# Patient Record
Sex: Male | Born: 1955 | Race: Black or African American | Hispanic: No | Marital: Single | Smoking: Never smoker
Health system: Southern US, Community
[De-identification: ages and names within clinical notes are randomized; demographics above are authoritative.]

## PROBLEM LIST (undated history)

## (undated) DIAGNOSIS — E119 Type 2 diabetes mellitus without complications: Secondary | ICD-10-CM

## (undated) DIAGNOSIS — I639 Cerebral infarction, unspecified: Secondary | ICD-10-CM

## (undated) DIAGNOSIS — I10 Essential (primary) hypertension: Secondary | ICD-10-CM

## (undated) DIAGNOSIS — E039 Hypothyroidism, unspecified: Secondary | ICD-10-CM

## (undated) DIAGNOSIS — D649 Anemia, unspecified: Secondary | ICD-10-CM

## (undated) DIAGNOSIS — N189 Chronic kidney disease, unspecified: Secondary | ICD-10-CM

## (undated) DIAGNOSIS — E785 Hyperlipidemia, unspecified: Secondary | ICD-10-CM

## (undated) DIAGNOSIS — G8220 Paraplegia, unspecified: Secondary | ICD-10-CM

## (undated) DIAGNOSIS — I629 Nontraumatic intracranial hemorrhage, unspecified: Secondary | ICD-10-CM

## (undated) DIAGNOSIS — Z7401 Bed confinement status: Secondary | ICD-10-CM

## (undated) HISTORY — PX: TOE AMPUTATION: SHX809

## (undated) HISTORY — PX: COLONOSCOPY: SHX174

---

## 2019-10-18 DIAGNOSIS — E119 Type 2 diabetes mellitus without complications: Secondary | ICD-10-CM | POA: Insufficient documentation

## 2019-12-09 DIAGNOSIS — I639 Cerebral infarction, unspecified: Secondary | ICD-10-CM

## 2019-12-09 HISTORY — DX: Cerebral infarction, unspecified: I63.9

## 2020-05-12 ENCOUNTER — Emergency Department (HOSPITAL_COMMUNITY): Payer: Medicare Other

## 2020-05-12 ENCOUNTER — Other Ambulatory Visit: Payer: Self-pay

## 2020-05-12 ENCOUNTER — Inpatient Hospital Stay (HOSPITAL_COMMUNITY)
Admission: EM | Admit: 2020-05-12 | Discharge: 2020-05-17 | DRG: 065 | Disposition: A | Discharge status: Home / Self Care | Payer: Medicare Other | Attending: Neurology | Admitting: Neurology

## 2020-05-12 ENCOUNTER — Inpatient Hospital Stay (HOSPITAL_COMMUNITY): Payer: Medicare Other

## 2020-05-12 DIAGNOSIS — G8220 Paraplegia, unspecified: Secondary | ICD-10-CM | POA: Diagnosis present

## 2020-05-12 DIAGNOSIS — I161 Hypertensive emergency: Secondary | ICD-10-CM | POA: Diagnosis present

## 2020-05-12 DIAGNOSIS — E785 Hyperlipidemia, unspecified: Secondary | ICD-10-CM | POA: Diagnosis present

## 2020-05-12 DIAGNOSIS — N1832 Chronic kidney disease, stage 3b: Secondary | ICD-10-CM | POA: Diagnosis present

## 2020-05-12 DIAGNOSIS — I10 Essential (primary) hypertension: Secondary | ICD-10-CM | POA: Diagnosis present

## 2020-05-12 DIAGNOSIS — I619 Nontraumatic intracerebral hemorrhage, unspecified: Secondary | ICD-10-CM | POA: Diagnosis present

## 2020-05-12 DIAGNOSIS — M4800 Spinal stenosis, site unspecified: Secondary | ICD-10-CM | POA: Diagnosis present

## 2020-05-12 DIAGNOSIS — R29810 Facial weakness: Secondary | ICD-10-CM | POA: Diagnosis present

## 2020-05-12 DIAGNOSIS — E876 Hypokalemia: Secondary | ICD-10-CM | POA: Diagnosis present

## 2020-05-12 DIAGNOSIS — I6389 Other cerebral infarction: Secondary | ICD-10-CM

## 2020-05-12 DIAGNOSIS — I618 Other nontraumatic intracerebral hemorrhage: Principal | ICD-10-CM | POA: Diagnosis present

## 2020-05-12 DIAGNOSIS — Z20822 Contact with and (suspected) exposure to covid-19: Secondary | ICD-10-CM | POA: Diagnosis present

## 2020-05-12 DIAGNOSIS — Z993 Dependence on wheelchair: Secondary | ICD-10-CM

## 2020-05-12 DIAGNOSIS — N183 Chronic kidney disease, stage 3 unspecified: Secondary | ICD-10-CM | POA: Diagnosis present

## 2020-05-12 DIAGNOSIS — R402362 Coma scale, best motor response, obeys commands, at arrival to emergency department: Secondary | ICD-10-CM | POA: Diagnosis present

## 2020-05-12 DIAGNOSIS — Z79899 Other long term (current) drug therapy: Secondary | ICD-10-CM

## 2020-05-12 DIAGNOSIS — N1831 Chronic kidney disease, stage 3a: Secondary | ICD-10-CM | POA: Diagnosis present

## 2020-05-12 DIAGNOSIS — N179 Acute kidney failure, unspecified: Secondary | ICD-10-CM | POA: Diagnosis present

## 2020-05-12 DIAGNOSIS — R402142 Coma scale, eyes open, spontaneous, at arrival to emergency department: Secondary | ICD-10-CM | POA: Diagnosis present

## 2020-05-12 DIAGNOSIS — R402232 Coma scale, best verbal response, inappropriate words, at arrival to emergency department: Secondary | ICD-10-CM | POA: Diagnosis present

## 2020-05-12 DIAGNOSIS — I129 Hypertensive chronic kidney disease with stage 1 through stage 4 chronic kidney disease, or unspecified chronic kidney disease: Secondary | ICD-10-CM | POA: Diagnosis present

## 2020-05-12 DIAGNOSIS — R4781 Slurred speech: Secondary | ICD-10-CM | POA: Diagnosis present

## 2020-05-12 DIAGNOSIS — R4702 Dysphasia: Secondary | ICD-10-CM | POA: Diagnosis present

## 2020-05-12 DIAGNOSIS — I610 Nontraumatic intracerebral hemorrhage in hemisphere, subcortical: Secondary | ICD-10-CM | POA: Diagnosis not present

## 2020-05-12 DIAGNOSIS — R29717 NIHSS score 17: Secondary | ICD-10-CM | POA: Diagnosis present

## 2020-05-12 DIAGNOSIS — R471 Dysarthria and anarthria: Secondary | ICD-10-CM | POA: Diagnosis present

## 2020-05-12 LAB — PROTIME-INR
INR: 1.1 (ref 0.8–1.2)
INR: 1.1 (ref 0.8–1.2)
Prothrombin Time: 13.6 seconds (ref 11.4–15.2)
Prothrombin Time: 13.6 seconds (ref 11.4–15.2)

## 2020-05-12 LAB — COMPREHENSIVE METABOLIC PANEL
ALT: 17 U/L (ref 0–44)
AST: 24 U/L (ref 15–41)
Albumin: 2.9 g/dL — ABNORMAL LOW (ref 3.5–5.0)
Alkaline Phosphatase: 83 U/L (ref 38–126)
Anion gap: 9 (ref 5–15)
BUN: 16 mg/dL (ref 8–23)
CO2: 27 mmol/L (ref 22–32)
Calcium: 8.8 mg/dL — ABNORMAL LOW (ref 8.9–10.3)
Chloride: 103 mmol/L (ref 98–111)
Creatinine, Ser: 1.76 mg/dL — ABNORMAL HIGH (ref 0.61–1.24)
GFR calc Af Amer: 46 mL/min — ABNORMAL LOW (ref >60)
GFR calc non Af Amer: 39 mL/min — ABNORMAL LOW (ref >60)
Glucose, Bld: 136 mg/dL — ABNORMAL HIGH (ref 70–99)
Potassium: 3.7 mmol/L (ref 3.5–5.1)
Sodium: 139 mmol/L (ref 135–145)
Total Bilirubin: 0.6 mg/dL (ref 0.3–1.2)
Total Protein: 7 g/dL (ref 6.5–8.1)

## 2020-05-12 LAB — DIFFERENTIAL
Abs Immature Granulocytes: 0.04 10*3/uL (ref 0.00–0.07)
Basophils Absolute: 0 10*3/uL (ref 0.0–0.1)
Basophils Relative: 0 %
Eosinophils Absolute: 0.2 10*3/uL (ref 0.0–0.5)
Eosinophils Relative: 3 %
Immature Granulocytes: 1 %
Lymphocytes Relative: 25 %
Lymphs Abs: 2 10*3/uL (ref 0.7–4.0)
Monocytes Absolute: 0.6 10*3/uL (ref 0.1–1.0)
Monocytes Relative: 8 %
Neutro Abs: 5 10*3/uL (ref 1.7–7.7)
Neutrophils Relative %: 63 %

## 2020-05-12 LAB — LIPID PANEL
Cholesterol: 165 mg/dL (ref 0–200)
HDL: 40 mg/dL — ABNORMAL LOW (ref >40)
LDL Cholesterol: 110 mg/dL — ABNORMAL HIGH (ref 0–99)
Total CHOL/HDL Ratio: 4.1 RATIO
Triglycerides: 74 mg/dL (ref <150)
VLDL: 15 mg/dL (ref 0–40)

## 2020-05-12 LAB — I-STAT CHEM 8, ED
BUN: 19 mg/dL (ref 8–23)
Calcium, Ion: 1.12 mmol/L — ABNORMAL LOW (ref 1.15–1.40)
Chloride: 103 mmol/L (ref 98–111)
Creatinine, Ser: 1.9 mg/dL — ABNORMAL HIGH (ref 0.61–1.24)
Glucose, Bld: 131 mg/dL — ABNORMAL HIGH (ref 70–99)
HCT: 37 % — ABNORMAL LOW (ref 39.0–52.0)
Hemoglobin: 12.6 g/dL — ABNORMAL LOW (ref 13.0–17.0)
Potassium: 3.7 mmol/L (ref 3.5–5.1)
Sodium: 141 mmol/L (ref 135–145)
TCO2: 25 mmol/L (ref 22–32)

## 2020-05-12 LAB — CBC
HCT: 36.3 % — ABNORMAL LOW (ref 39.0–52.0)
Hemoglobin: 11.4 g/dL — ABNORMAL LOW (ref 13.0–17.0)
MCH: 31.7 pg (ref 26.0–34.0)
MCHC: 31.4 g/dL (ref 30.0–36.0)
MCV: 100.8 fL — ABNORMAL HIGH (ref 80.0–100.0)
Platelets: 266 10*3/uL (ref 150–400)
RBC: 3.6 MIL/uL — ABNORMAL LOW (ref 4.22–5.81)
RDW: 14 % (ref 11.5–15.5)
WBC: 7.9 10*3/uL (ref 4.0–10.5)
nRBC: 0 % (ref 0.0–0.2)

## 2020-05-12 LAB — APTT: aPTT: 30 seconds (ref 24–36)

## 2020-05-12 LAB — ETHANOL: Alcohol, Ethyl (B): <10 mg/dL (ref <10)

## 2020-05-12 LAB — ECHOCARDIOGRAM COMPLETE: Weight: 2740.76 oz

## 2020-05-12 LAB — HIV ANTIBODY (ROUTINE TESTING W REFLEX): HIV Screen 4th Generation wRfx: NONREACTIVE

## 2020-05-12 LAB — HEMOGLOBIN A1C
Hgb A1c MFr Bld: 5.2 % (ref 4.8–5.6)
Mean Plasma Glucose: 102.54 mg/dL

## 2020-05-12 LAB — SARS CORONAVIRUS 2 BY RT PCR (HOSPITAL ORDER, PERFORMED IN ~~LOC~~ HOSPITAL LAB): SARS Coronavirus 2: NEGATIVE

## 2020-05-12 LAB — MRSA PCR SCREENING: MRSA by PCR: NEGATIVE

## 2020-05-12 LAB — CBG MONITORING, ED: Glucose-Capillary: 124 mg/dL — ABNORMAL HIGH (ref 70–99)

## 2020-05-12 MED ORDER — SODIUM CHLORIDE 0.9 % IV SOLN: INTRAVENOUS | Status: AC

## 2020-05-12 MED ORDER — CLEVIDIPINE BUTYRATE 0.5 MG/ML IV EMUL: 0.0000 mg/h | INTRAVENOUS | Status: DC

## 2020-05-12 MED ORDER — METOPROLOL TARTRATE 25 MG PO TABS
25.0000 mg | ORAL_TABLET | Freq: Two times a day (BID) | ORAL | Status: DC
  Administered 2020-05-12 – 2020-05-17 (×11): 25 mg via ORAL
  Filled 2020-05-12 (×11): qty 1

## 2020-05-12 MED ORDER — STROKE: EARLY STAGES OF RECOVERY BOOK
Freq: Once | Status: AC
  Filled 2020-05-12: qty 1

## 2020-05-12 MED ORDER — PANTOPRAZOLE SODIUM 40 MG IV SOLR
40.0000 mg | Freq: Every day | INTRAVENOUS | Status: DC
  Administered 2020-05-12: 40 mg via INTRAVENOUS
  Filled 2020-05-12: qty 40

## 2020-05-12 MED ORDER — CHLORHEXIDINE GLUCONATE CLOTH 2 % EX PADS
6.0000 | MEDICATED_PAD | Freq: Every day | CUTANEOUS | Status: DC
  Administered 2020-05-12 – 2020-05-17 (×6): 6 via TOPICAL

## 2020-05-12 MED ORDER — CLEVIDIPINE BUTYRATE 0.5 MG/ML IV EMUL
0.0000 mg/h | INTRAVENOUS | Status: DC
  Administered 2020-05-12: 8 mg/h via INTRAVENOUS
  Administered 2020-05-12: 1 mg/h via INTRAVENOUS
  Administered 2020-05-12 – 2020-05-13 (×2): 7 mg/h via INTRAVENOUS
  Administered 2020-05-13: 8 mg/h via INTRAVENOUS
  Administered 2020-05-13: 5 mg/h via INTRAVENOUS
  Administered 2020-05-14: 6 mg/h via INTRAVENOUS
  Administered 2020-05-14: 7 mg/h via INTRAVENOUS
  Filled 2020-05-12 (×9): qty 50

## 2020-05-12 MED ORDER — ACETAMINOPHEN 650 MG RE SUPP: 650.0000 mg | RECTAL | Status: DC | PRN

## 2020-05-12 MED ORDER — SENNOSIDES-DOCUSATE SODIUM 8.6-50 MG PO TABS
1.0000 | ORAL_TABLET | Freq: Two times a day (BID) | ORAL | Status: DC
  Administered 2020-05-12 (×2): 1 via ORAL
  Filled 2020-05-12 (×3): qty 1

## 2020-05-12 MED ORDER — ACETAMINOPHEN 325 MG PO TABS
650.0000 mg | ORAL_TABLET | ORAL | Status: DC | PRN
  Administered 2020-05-13 – 2020-05-15 (×5): 650 mg via ORAL
  Filled 2020-05-12 (×5): qty 2

## 2020-05-12 MED ORDER — ACETAMINOPHEN 160 MG/5ML PO SOLN: 650.0000 mg | ORAL | Status: DC | PRN

## 2020-05-12 MED ORDER — TRAZODONE HCL 50 MG PO TABS
50.0000 mg | ORAL_TABLET | Freq: Every evening | ORAL | Status: DC | PRN
  Administered 2020-05-15: 50 mg via ORAL
  Filled 2020-05-12: qty 1

## 2020-05-12 NOTE — Code Documentation (Addendum)
Code Stroke Documentation  Pt arrived to [facility] via EMS at 0820. EMS brought pt in with new onset slurred speech, right arm weakness, and right leg weakness. Per EMS, the patient has a past history of spinal stenosis, causing him to have a decreased movement on the right side, however, his symptoms were worse this morning. Pt states he lives at home with his brother. Pt states he is wheelchair bound. Per pt, his LKN was 2300 on 05/11/2020 before he went to bed. Code stroke was activated at 0840.  Upon assessment, he is found to be alert. Disoriented to age and month. Minor right facial droop. No effort against gravity to the right arm and right leg. Can't resist gravity to the left leg. Ataxia present in his upper and lower extremities. Sensation loss in his right arm and face. Moderate aphasia. Moderate dysarthria.   CT head completed. Cleviprex gtt started for BP of 227/99.   Plan: q1 hour mNIHSS and VS w/ pupils, titrate Cleviprex to maintain SBP 100-140. Handoff given to S. Andrey Campanile, RN.

## 2020-05-12 NOTE — Plan of Care (Signed)
Pt on room air, calm, cooperative. Attempted to locate son in lobby but not present.

## 2020-05-12 NOTE — Progress Notes (Signed)
Found phone number for [name] Poke, pt son, and called him to give update. Aware patient is now on 4N-ICU and will come visit soon.  Sons: Chrsitopher Wik [phone] Simmie Davies Thurnell Lose. [phone]

## 2020-05-12 NOTE — Progress Notes (Signed)
Echocardiogram 2D Echocardiogram has been performed.  Terry Barton 05/12/2020, 2:52 PM

## 2020-05-12 NOTE — ED Provider Notes (Signed)
[facility] EMERGENCY DEPARTMENT Provider Note   CSN: [id number] Arrival date & time: 05/12/20  9211  An emergency department physician performed an initial assessment on this suspected stroke patient at 57.  History Chief Complaint  Patient presents with  . Code Stroke    Terry Barton is a 64 y.o. male.  HPI Level 5 caveat due to altered mental status. Patient presented from home.  Reportedly right-sided weakness.  Somewhat difficult to get history.  Patient does have some confusion and states he normally can use his right side without any difficulty, although also states he uses a wheelchair.  Reported history of spinal stenosis with right-sided weakness.  Does question that the right side may have gone flaccid.  Upon arrival right arm is held against body.  Does have right-sided weakness and right-sided facial droop.  Last normal was apparently 1130 last night.  Does have some differentiating left and right with sensation.     No past medical history on file. unknown There are no problems to display for this patient.        No family history on file.  Social History   Tobacco Use  . Smoking status: Not on file  Substance Use Topics  . Alcohol use: Not on file  . Drug use: Not on file    Home Medications Prior to Admission medications   Medication Sig Start Date End Date Taking? Authorizing Provider  cyproheptadine (PERIACTIN) 4 MG tablet Take by mouth 3 (three) times daily.   Yes [provider]  ferrous sulfate 325 (65 FE) MG EC tablet Take 325 mg by mouth 3 (three) times daily with meals.   Yes [provider]  metoprolol tartrate (LOPRESSOR) 25 MG tablet Take by mouth 2 (two) times daily.   Yes [provider]  traZODone (DESYREL) 50 MG tablet Take 50 mg by mouth at bedtime as needed for sleep.   Yes [provider]    Allergies    Patient has no allergy information on record.  Review of Systems     Review of Systems  Unable to perform ROS: Mental status change    Physical Exam Updated Vital Signs BP 125/63   Pulse 67   Temp 97.7 F (36.5 C) (Oral)   Resp 14   Wt 77.7 kg   SpO2 100%   Physical Exam Vitals and nursing note reviewed.  HENT:     Head: Atraumatic.  Eyes:     Extraocular Movements: Extraocular movements intact.  Cardiovascular:     Rate and Rhythm: Regular rhythm.  Pulmonary:     Breath sounds: No wheezing or rhonchi.  Abdominal:     Tenderness: There is no abdominal tenderness.  Musculoskeletal:        General: No tenderness.     Cervical back: Neck supple.     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  Neurological:     Mental Status: He is alert.     Comments: Awake and answers questions with somewhat slow to answer and some confusion.  Right-sided facial droop.  Eye movements intact.  Right upper extremity held against body but will follow some commands with the.  Will raise right leg some but weaker compared to contralateral.  Better strength on left side.  Complete NIH scoring done by neurology.  Differentiating sensation on left right or both appears somewhat difficult.     ED Results / Procedures / Treatments   Labs (all labs ordered are  listed, but only abnormal results are displayed) Labs Reviewed  CBC - Abnormal; Notable for the following components:      Result Value   RBC 3.60 (*)    Hemoglobin 11.4 (*)    HCT 36.3 (*)    MCV 100.8 (*)    All other components within normal limits  COMPREHENSIVE METABOLIC PANEL - Abnormal; Notable for the following components:   Glucose, Bld 136 (*)    Creatinine, Ser 1.76 (*)    Calcium 8.8 (*)    Albumin 2.9 (*)    GFR calc non Af Amer 39 (*)    GFR calc Af Amer 46 (*)    All other components within normal limits  I-STAT CHEM 8, ED - Abnormal; Notable for the following components:   Creatinine, Ser 1.90 (*)    Glucose, Bld 131 (*)    Calcium, Ion 1.12 (*)    Hemoglobin 12.6  (*)    HCT 37.0 (*)    All other components within normal limits  CBG MONITORING, ED - Abnormal; Notable for the following components:   Glucose-Capillary 124 (*)    All other components within normal limits  SARS CORONAVIRUS 2 BY RT PCR (HOSPITAL ORDER, PERFORMED IN [facility])  ETHANOL  PROTIME-INR  APTT  DIFFERENTIAL  RAPID URINE DRUG SCREEN, HOSP PERFORMED  URINALYSIS, ROUTINE W REFLEX MICROSCOPIC    EKG EKG Interpretation  Date/Time:  Saturday May 12 2020 08:22:46 EDT Ventricular Rate:  67 PR Interval:  202 QRS Duration: 76 QT Interval:  432 QTC Calculation: 456 R Axis:   21 Text Interpretation: Normal sinus rhythm Nonspecific T wave abnormality Abnormal ECG Confirmed by Benjiman Core [phone]) on 05/12/2020 8:38:49 AM   Radiology CT HEAD CODE STROKE WO CONTRAST  Result Date: 05/12/2020 CLINICAL DATA:  Code stroke. Right-sided weakness and right facial droop. EXAM: CT HEAD WITHOUT CONTRAST TECHNIQUE: Contiguous axial images were obtained from the base of the skull through the vertex without intravenous contrast. COMPARISON:  None. FINDINGS: Brain: 1.6 by 1.7 x 2.4 cm in diameter intraparenchymal hematoma in the left thalamus with intraventricular penetration. Surrounding edema. No hydrocephalus. Chronic small-vessel ischemic changes affect the pons and cerebral hemispheric white matter elsewhere. Vascular: There is atherosclerotic calcification of the major vessels at the base of the brain. Skull: Negative Sinuses/Orbits: Inflammatory changes of the right division of the sphenoid sinus. Orbits negative. Other: None ASPECTS (Alberta Stroke Program Early CT Score) - Ganglionic level infarction (caudate, lentiform nuclei, internal capsule, insula, M1-M3 cortex): 7 - Supraganglionic infarction (M4-M6 cortex): 3 Total score (0-10 with 10 being normal): 10 IMPRESSION: 1. Acute intraparenchymal hemorrhage in the left thalamus with measurements of 1.6 x 1.7 x 2.4 (volume =  3.4) Cm mild surrounding edema. Intraventricular penetration with blood in the left lateral ventricle. No ventricular obstruction. 2. ASPECTS is 10. 3. These results were communicated to Dr. Otelia Limes at 8:57 amon 6/5/2021by text page via the [facility] messaging system. Electronically Signed   By: Paulina Fusi M.D.   On: 05/12/2020 08:58    Procedures Procedures (including critical care time)  Medications Ordered in ED Medications  clevidipine (CLEVIPREX) infusion 0.5 mg/mL (4 mg/hr Intravenous Rate/Dose Change 05/12/20 0272)    ED Course  I have reviewed the triage vital signs and the nursing notes.  Pertinent labs & imaging results that were available during my care of the patient were reviewed by me and considered in my medical decision making (see chart for details).    MDM Rules/Calculators/A&P  Patient came in with right-sided neuro deficits.  Unknown chronicity reported last normal last night at 1130 but unsure what that normally is.  Reportedly had weakness but also patient states he has no weakness.  But he does have a wheelchair.There was question of being flaccid but he is much more contracted this time.  Code stroke called due to neuro deficits.  Patient is rather severely hypertensive also.  Head CT done and showed intracranial hemorrhage.  Will be admitted to ICU by neurology.  CRITICAL CARE Performed by: Benjiman Core Total critical care time: 30 minutes Critical care time was exclusive of separately billable procedures and treating other patients. Critical care was necessary to treat or prevent imminent or life-threatening deterioration. Critical care was time spent personally by me on the following activities: development of treatment plan with patient and/or surrogate as well as nursing, discussions with consultants, evaluation of patient's response to treatment, examination of patient, obtaining history from patient or surrogate, ordering and performing  treatments and interventions, ordering and review of laboratory studies, ordering and review of radiographic studies, pulse oximetry and re-evaluation of patient's condition.  Final Clinical Impression(s) / ED Diagnoses Final diagnoses:  Hemorrhagic stroke [facility])    Rx / DC Orders ED Discharge Orders    None       Benjiman Core, MD 05/12/20 0930

## 2020-05-12 NOTE — ED Triage Notes (Signed)
Patient arrives via ems from home due to having new onset slurred speech and increased R. Arm and R. Leg weakness. Per ems, the patient has a past history of spinal stenosis, causing him to have a decreased movement on the right side, however, his symptoms were worse this morning.   Patient arrives with A&O x2. LKW at 2300 last night.

## 2020-05-12 NOTE — H&P (Signed)
Admission H&P    Chief Complaint: Acute onset of worsened right sided weakness.  HPI: Terry Barton is an 64 y.o. male presenting to [facility] with acute onset of worsened right sided weakness and new onset slurred speech. LKN was last night at 2330. Per EMS, he has a history of spinal stenosis with associated right sided weakness at baseline and uses a wheelchair. He lives at home with his brother. In the context of patient being confused, he informed the EDP that at his baseline he can use his right side without any difficulty. His BP was 227/99 in CT.   CT head revealed an acute left thalamic hemorrhage with mild surrounding edema and extension of blood into the left lateral ventricle.   No past medical history on file.  PMHx Spinal stenosis per report  No family history on file. Social History:  has no history on file for tobacco, alcohol, and drug.  Allergies: Not on File  No current facility-administered medications on file prior to encounter.   Current Outpatient Medications on File Prior to Encounter  Medication Sig Dispense Refill  . cyproheptadine (PERIACTIN) 4 MG tablet Take by mouth 3 (three) times daily.    . ferrous sulfate 325 (65 FE) MG EC tablet Take 325 mg by mouth 3 (three) times daily with meals.    . metoprolol tartrate (LOPRESSOR) 25 MG tablet Take by mouth 2 (two) times daily.    . traZODone (DESYREL) 50 MG tablet Take 50 mg by mouth at bedtime as needed for sleep.      ROS: As per HPI. Comprehensive ROS otherwise negative.   Physical Examination: Blood pressure 125/63, pulse 67, temperature 97.7 F (36.5 C), temperature source Oral, resp. rate 14, weight 77.7 kg, SpO2 100 %.  HEENT-  [place]/AT  Lungs - Respirations unlabored  Ext: No cyanosis. Warm and well perfused.  Neurologic Examination: Ment: Awake and alert. Confused. Oriented 3/5. Increased latencies of verbal and motor responses. Speech mildly dysarthric. In the context of sparse speech, there is word  finding difficulty and hesitation, consistent with loss of fluency. Has difficulty with naming. Repetition impaired. Has difficulty following some commands.  CN: PERRL. No visual field cut. Eyes are conjugate with slight left gaze preference. Able to track to left and right, but has more difficulty with rightward gaze. Temp sensation equal. Mild right facial droop. Hearing intact to voice. No hoarseness, but speech is somewhat hypophonic. Head is preferentially rotated slightly to the left. Tongue deviates slightly to the right.  Motor:  RUE: Increased flexor tone at elbow, wrist and digits. Some loss of muscle mass of hand intrinsics. Arm held adducted at shoulder. 3/5 strength at deltoid, otherwise 2/5 triceps/biceps and grip.  RLE: Falls to bed immediately after being passively elevated. Withdraws from noxious with 2/5 strength.  LUE and LLE 4/5.  Sensory: Absent FT and pain sensation to RUE. Subjectively intact FT and temp sensation to LUE and BLE.  Reflexes: Low amplitude reflexes x 4, brisker on the right.  Cerebellar: Ataxic BUE Gait: Unable to assess.    Results for orders placed or performed during the hospital encounter of 05/12/20 (from the past 48 hour(s))  Ethanol     Status: None   Collection Time: 05/12/20  8:35 AM  Result Value Ref Range   Alcohol, Ethyl (B) <10 <10 mg/dL    Comment: (NOTE) Lowest detectable limit for serum alcohol is 10 mg/dL. For medical purposes only. Performed at [facility], [place],  [place] [address]   CBG monitoring, ED     Status: Abnormal   Collection Time: 05/12/20  8:35 AM  Result Value Ref Range   Glucose-Capillary 124 (H) 70 - 99 mg/dL    Comment: Glucose reference range applies only to samples taken after fasting for at least 8 hours.  Protime-INR     Status: None   Collection Time: 05/12/20  8:39 AM  Result Value Ref Range   Prothrombin Time 13.6 11.4 - 15.2 seconds   INR 1.1 0.8 - 1.2    Comment: (NOTE) INR goal  varies based on device and disease states. Performed at [facility], [address], [place], [place] [address]   APTT     Status: None   Collection Time: 05/12/20  8:39 AM  Result Value Ref Range   aPTT 30 24 - 36 seconds    Comment: Performed at [facility], [address], [place], [place] [address]  CBC     Status: Abnormal   Collection Time: 05/12/20  8:39 AM  Result Value Ref Range   WBC 7.9 4.0 - 10.5 K/uL   RBC 3.60 (L) 4.22 - 5.81 MIL/uL   Hemoglobin 11.4 (L) 13.0 - 17.0 g/dL   HCT 64.4 (L) 03.4 - 74.2 %   MCV 100.8 (H) 80.0 - 100.0 fL   MCH 31.7 26.0 - 34.0 pg   MCHC 31.4 30.0 - 36.0 g/dL   RDW 59.5 63.8 - 75.6 %   Platelets 266 150 - 400 K/uL   nRBC 0.0 0.0 - 0.2 %    Comment: Performed at [facility], [address] [place], [place] [address]  Differential     Status: None   Collection Time: 05/12/20  8:39 AM  Result Value Ref Range   Neutrophils Relative % 63 %   Neutro Abs 5.0 1.7 - 7.7 K/uL   Lymphocytes Relative 25 %   Lymphs Abs 2.0 0.7 - 4.0 K/uL   Monocytes Relative 8 %   Monocytes Absolute 0.6 0.1 - 1.0 K/uL   Eosinophils Relative 3 %   Eosinophils Absolute 0.2 0.0 - 0.5 K/uL   Basophils Relative 0 %   Basophils Absolute 0.0 0.0 - 0.1 K/uL   Immature Granulocytes 1 %   Abs Immature Granulocytes 0.04 0.00 - 0.07 K/uL    Comment: Performed at [facility], [address], [place], [place] [address]  Comprehensive metabolic panel     Status: Abnormal   Collection Time: 05/12/20  8:39 AM  Result Value Ref Range   Sodium 139 135 - 145 mmol/L   Potassium 3.7 3.5 - 5.1 mmol/L   Chloride 103 98 - 111 mmol/L   CO2 27 22 - 32 mmol/L   Glucose, Bld 136 (H) 70 - 99 mg/dL    Comment: Glucose reference range applies only to samples taken after fasting for at least 8 hours.   BUN 16 8 - 23 mg/dL   Creatinine, Ser 1.66 (H) 0.61 - 1.24 mg/dL   Calcium 8.8 (L) 8.9 - 10.3 mg/dL   Total Protein 7.0 6.5 - 8.1 g/dL   Albumin 2.9 (L) 3.5 -  5.0 g/dL   AST 24 15 - 41 U/L   ALT 17 0 - 44 U/L   Alkaline Phosphatase 83 38 - 126 U/L   Total Bilirubin 0.6 0.3 - 1.2 mg/dL   GFR calc non Af Amer 39 (L) >60 mL/min   GFR calc Af Amer 46 (  L) >60 mL/min   Anion gap 9 5 - 15    Comment: Performed at [facility], [address], [place], [place] [address]  I-stat chem 8, ED     Status: Abnormal   Collection Time: 05/12/20  8:44 AM  Result Value Ref Range   Sodium 141 135 - 145 mmol/L   Potassium 3.7 3.5 - 5.1 mmol/L   Chloride 103 98 - 111 mmol/L   BUN 19 8 - 23 mg/dL   Creatinine, Ser 8.34 (H) 0.61 - 1.24 mg/dL   Glucose, Bld 196 (H) 70 - 99 mg/dL    Comment: Glucose reference range applies only to samples taken after fasting for at least 8 hours.   Calcium, Ion 1.12 (L) 1.15 - 1.40 mmol/L   TCO2 25 22 - 32 mmol/L   Hemoglobin 12.6 (L) 13.0 - 17.0 g/dL   HCT 22.2 (L) 97.9 - 89.2 %   CT HEAD CODE STROKE WO CONTRAST  Result Date: 05/12/2020 CLINICAL DATA:  Code stroke. Right-sided weakness and right facial droop. EXAM: CT HEAD WITHOUT CONTRAST TECHNIQUE: Contiguous axial images were obtained from the base of the skull through the vertex without intravenous contrast. COMPARISON:  None. FINDINGS: Brain: 1.6 by 1.7 x 2.4 cm in diameter intraparenchymal hematoma in the left thalamus with intraventricular penetration. Surrounding edema. No hydrocephalus. Chronic small-vessel ischemic changes affect the pons and cerebral hemispheric white matter elsewhere. Vascular: There is atherosclerotic calcification of the major vessels at the base of the brain. Skull: Negative Sinuses/Orbits: Inflammatory changes of the right division of the sphenoid sinus. Orbits negative. Other: None ASPECTS (Alberta Stroke Program Early CT Score) - Ganglionic level infarction (caudate, lentiform nuclei, internal capsule, insula, M1-M3 cortex): 7 - Supraganglionic infarction (M4-M6 cortex): 3 Total score (0-10 with 10 being normal): 10 IMPRESSION: 1. Acute  intraparenchymal hemorrhage in the left thalamus with measurements of 1.6 x 1.7 x 2.4 (volume = 3.4) Cm mild surrounding edema. Intraventricular penetration with blood in the left lateral ventricle. No ventricular obstruction. 2. ASPECTS is 10. 3. These results were communicated to Dr. Otelia Limes at 8:57 amon 6/5/2021by text page via the [facility] messaging system. Electronically Signed   By: Paulina Fusi M.D.   On: 05/12/2020 08:58    Assessment: 64 year old male presenting with acute onset of worsened right sided weakness. 1. Exam reveals plegic RUE and paretic RLE in addition to slight left sided gaze preference and speech deficits.  2. CT head reveals an acute left thalamic hemorrhage with mild surrounding edema and left lateral ventricular blood.   3. Possible CKD versus AKI given elevated Cr on admission 4. Unable to obtain a reliable history from patient due to his confusion and dysphasia. No contact numbers are listed in demographics section of Epic.   Plan:  1. Admit to ICU under Neurology service 2. MRI/MRA of head if there is no contraindication after PMHx is clarified 3. Carotid ultrasound 4. TTE 5. PT consult, OT consult, Speech consult 6. Cardiac telemetry 7. Frequent neuro checks 8. Repeat CT head in 24 hours.   9. BP management with clevidipine drip  10. No antiplatelet medications or anticoagulants  11. IVF 75 cc/hr NS 12. Monitor CBC and electrolytes daily. Monitor CBG. Monitor UOP.  13. When family contact information becomes available, will need to obtain more information regarding his PMHx.    Electronically signed: Dr. Caryl Pina 05/12/2020, 9:34 AM

## 2020-05-12 NOTE — ED Notes (Signed)
Pt to xray

## 2020-05-13 ENCOUNTER — Inpatient Hospital Stay (HOSPITAL_COMMUNITY): Payer: Medicare Other

## 2020-05-13 DIAGNOSIS — I610 Nontraumatic intracerebral hemorrhage in hemisphere, subcortical: Secondary | ICD-10-CM

## 2020-05-13 MED ORDER — PANTOPRAZOLE SODIUM 40 MG PO TBEC
40.0000 mg | DELAYED_RELEASE_TABLET | Freq: Every day | ORAL | Status: DC
  Administered 2020-05-13 – 2020-05-14 (×2): 40 mg via ORAL
  Filled 2020-05-13 (×2): qty 1

## 2020-05-13 MED ORDER — WHITE PETROLATUM EX OINT
TOPICAL_OINTMENT | CUTANEOUS | Status: AC
  Administered 2020-05-13: 1
  Filled 2020-05-13: qty 28.35

## 2020-05-13 MED ORDER — ASCORBIC ACID 500 MG PO TABS
500.0000 mg | ORAL_TABLET | Freq: Every day | ORAL | Status: DC
  Administered 2020-05-13 – 2020-05-17 (×5): 500 mg via ORAL
  Filled 2020-05-13 (×5): qty 1

## 2020-05-13 MED ORDER — HYDROCHLOROTHIAZIDE 25 MG PO TABS
25.0000 mg | ORAL_TABLET | Freq: Every day | ORAL | Status: DC
  Administered 2020-05-13 – 2020-05-17 (×5): 25 mg via ORAL
  Filled 2020-05-13 (×5): qty 1

## 2020-05-13 MED ORDER — HYDRALAZINE HCL 20 MG/ML IJ SOLN
20.0000 mg | Freq: Four times a day (QID) | INTRAMUSCULAR | Status: DC | PRN
  Administered 2020-05-14: 20 mg via INTRAVENOUS
  Filled 2020-05-13: qty 1

## 2020-05-13 MED ORDER — ALUM & MAG HYDROXIDE-SIMETH 200-200-20 MG/5ML PO SUSP
30.0000 mL | ORAL | Status: DC | PRN
  Administered 2020-05-13: 30 mL via ORAL
  Filled 2020-05-13: qty 30

## 2020-05-13 NOTE — Evaluation (Signed)
Occupational Therapy Evaluation Patient Details Name: Terry Barton MRN: [id number] DOB: 14-Sep-1956 Today's Date: 05/13/2020    History of Present Illness 64 y.o. male presenting to [facility] with acute onset of worsened right sided weakness and new onset slurred speech. LKN was last night at 2330. Per EMS, he has a history of spinal stenosis with associated right sided weakness at baseline and uses a wheelchair. 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   Clinical Impression   PT admitted with L thalamic hemorrhage with extension into L lateral ventrical. Pt currently with functional limitiations due to the deficits listed below (see OT problem list). Pt currently with total (A) to log roll. Pt PTA was using foyer lift per his reports into a wheelchair. The wheelchair appears to be standard but details were very vague from patient with multiple very direct questions from staff. Pt reports having two chairs and one not being in [place] ( assumed to be power chair due to descriptions). Pt will requires 24/7 (A) from family to d/c home.  Pt will benefit from skilled OT to increase their independence and safety with adls and balance to allow discharge SNF (until confirmed family can provide high level of care at the apartment).     Follow Up Recommendations  SNF    Equipment Recommendations  Wheelchair cushion (measurements OT);Wheelchair (measurements OT);Hospital bed;Other (comment)(hoyer)    Recommendations for Other Services       Precautions / Restrictions Precautions Precautions: Fall Restrictions Weight Bearing Restrictions: No      Mobility Bed Mobility Overal bed mobility: Needs Assistance Bed Mobility: Rolling Rolling: Total assist         General bed mobility comments: pt requires (A) to initiate roll and moving a unit. pt does not have any segmental rolling and could benefit next session from trunk rotation from LB to help  with bed level care.   Transfers                 General transfer comment: room setup for [facility] transfer. RN arriving and requesting remain supine due to pending MRI scan. RN to (A) patient to chair after MRI per conversation at end of session.     Balance                                           ADL either performed or assessed with clinical judgement   ADL Overall ADL's : Needs assistance/impaired Eating/Feeding: Set up;Bed level Eating/Feeding Details (indicate cue type and reason): pt requires everyting placed toward L side and  set up Grooming: Minimal assistance   Upper Body Bathing: Moderate assistance   Lower Body Bathing: Total assistance   Upper Body Dressing : Moderate assistance   Lower Body Dressing: Total assistance                 General ADL Comments: pt with loud belly sounds. pt reports hunger when asked about belly sound. upon rolling discovered incontinence and pt reports that it happens but he is unaware. pt diarrhea at this time total (A) for hygiene. pt total (A) to log roll R and L.      Vision   Additional Comments: noted L eye is red in appearance     Perception     Praxis      Pertinent Vitals/Pain Pain Assessment: No/denies pain  Hand Dominance     Extremity/Trunk Assessment Upper Extremity Assessment Upper Extremity Assessment: (P) Defer to OT evaluation RUE Deficits / Details: tone noted and unable to fully extend elbow. pt able to move to 90 degrees elbow extension, no abduction but with PROM 45 degrees abduction, digit activation noted but unable to grasp. pt noted to hold wrist in a flexed position with tighteness. pt denies splint use or prior deficits in hand or wrist.  RUE Sensation: decreased light touch;decreased proprioception LUE Deficits / Details: AROM digits wrist and elbow. pt with AAROM 90 degree flexion shoulder then reports discomfort. pt self feeding with L side   Lower Extremity  Assessment Lower Extremity Assessment: (P) RLE deficits/detail;LLE deficits/detail   Cervical / Trunk Assessment Cervical / Trunk Assessment: Kyphotic;Other exceptions( keep body as a unit)   Communication Communication Communication: Expressive difficulties(may be related to cognitive deficits)   Cognition Arousal/Alertness: Awake/alert Behavior During Therapy: WFL for tasks assessed/performed Overall Cognitive Status: Impaired/Different from baseline Area of Impairment: Orientation;Memory;Problem solving                 Orientation Level: (P) Disoriented to;Time   Memory: (P) Decreased short-term memory       Problem Solving: (P) Slow processing General Comments: (P) pt believes it is sunday initially, is able to identify year with choice of 2   General Comments  BP supine 142/71     Exercises     Shoulder Instructions      Home Living Family/patient expects to be discharged to:: Private residence Living Arrangements: Children Available Help at Discharge: Family;Available PRN/intermittently Type of Home: Apartment Home Access: Level entry     Home Layout: Two level;Able to live on main level with bedroom/bathroom               Home Equipment: Wheelchair - manual;Other (comment)(mechanical lift, power wheelchair is at his old home)   Additional Comments: lives with son that works at funeral home, reports just moved 1 month ago, son does all ADL , pt reports that he does not have power chair with him currently. pt reports that son must push him      Prior Functioning/Environment Level of Independence: Needs assistance  Gait / Transfers Assistance Needed: uses power chair ADL's / Homemaking Assistance Needed: sponge bath total (A) in bed   Comments: 5 years to lift to chair, son does all care no aides        OT Problem List: Decreased strength;Decreased activity tolerance;Decreased range of motion;Impaired balance (sitting and/or standing);Decreased  cognition;Decreased safety awareness;Decreased knowledge of use of DME or AE;Obesity;Impaired UE functional use      OT Treatment/Interventions: Self-care/ADL training;Therapeutic exercise;Neuromuscular education;Energy conservation;DME and/or AE instruction;Manual therapy;Modalities;Splinting;Therapeutic activities;Cognitive remediation/compensation;Patient/family education;Balance training    OT Goals(Current goals can be found in the care plan section) Acute Rehab OT Goals Patient Stated Goal: none stated OT Goal Formulation: With patient Time For Goal Achievement: 05/27/20 Potential to Achieve Goals: Good  OT Frequency: Min 2X/week   Barriers to D/C: Decreased caregiver support  reports son works for 1 hour per day       Co-evaluation PT/OT/SLP Co-Evaluation/Treatment: Yes Reason for Co-Treatment: Complexity of the patient's impairments (multi-system involvement);Necessary to address cognition/behavior during functional activity;For patient/therapist safety;To address functional/ADL transfers PT goals addressed during session: Mobility/safety with mobility;Strengthening/ROM OT goals addressed during session: ADL's and self-care;Proper use of Adaptive equipment and DME;Strengthening/ROM      AM-PAC OT "6 Clicks" Daily Activity     Outcome Measure Help  from another person eating meals?: A Little Help from another person taking care of personal grooming?: Total Help from another person toileting, which includes using toliet, bedpan, or urinal?: Total Help from another person bathing (including washing, rinsing, drying)?: Total Help from another person to put on and taking off regular upper body clothing?: Total Help from another person to put on and taking off regular lower body clothing?: Total 6 Click Score: 8   End of Session Nurse Communication: Mobility status;Precautions  Activity Tolerance: Patient tolerated treatment well Patient left: in bed;with call bell/phone within  reach;with bed alarm set  OT Visit Diagnosis: Muscle weakness (generalized) (M62.81)                Time: 5102-5852 OT Time Calculation (min): 32 min Charges:  OT General Charges $OT Visit: 1 Visit OT Evaluation $OT Eval Moderate Complexity: 1 Mod   Brynn, OTR/L  Acute Rehabilitation Services Pager: [phone] Office: [phone] .   Jeri Modena 05/13/2020, 1:09 PM

## 2020-05-13 NOTE — Progress Notes (Signed)
STROKE TEAM PROGRESS NOTE   HISTORY OF PRESENT ILLNESS (per record) Terry Barton is an 64 y.o. male presenting to [facility] with acute onset of worsened right sided weakness and new onset slurred speech. LKN was last night at 2330. Per EMS, he has a history of spinal stenosis with associated right sided weakness at baseline and uses a wheelchair. He lives at home with his brother. In the context of patient being confused, he informed the EDP that at his baseline he can use his right side without any difficulty. His BP was 227/99 in CT.  CT head revealed an acute left thalamic hemorrhage with mild surrounding edema and extension of blood into the left lateral ventricle.   INTERVAL HISTORY His RN is at the bedside.  Patient is awake and sitting up in bed eating breakfast.  He states his right-sided weakness is unchanged.  He is in a wheelchair for the last 4 years following spinal stenosis and residual paraparesis.  He is still on IV Cleviprex drip for blood pressure control.  He states his blood pressure is poorly controlled despite being on 4 different blood pressure medications at home.    OBJECTIVE Vitals:   05/13/20 0630 05/13/20 0645 05/13/20 0700 05/13/20 0715  BP: 130/61 124/75 114/64 116/79  Pulse: 61 (!) 59 (!) 57 (!) 56  Resp: 11 17 11 14   Temp:      TempSrc:      SpO2: 100% 100% 100% 100%  Weight:        CBC:  Recent Labs  Lab 05/12/20 0839 05/12/20 0844  WBC 7.9  --   NEUTROABS 5.0  --   HGB 11.4* 12.6*  HCT 36.3* 37.0*  MCV 100.8*  --   PLT 266  --     Basic Metabolic Panel:  Recent Labs  Lab 05/12/20 0839 05/12/20 0844  NA 139 141  K 3.7 3.7  CL 103 103  CO2 27  --   GLUCOSE 136* 131*  BUN 16 19  CREATININE 1.76* 1.90*  CALCIUM 8.8*  --     Lipid Panel:     Component Value Date/Time   CHOL 165 05/12/2020 1011   TRIG 74 05/12/2020 1011   HDL 40 (L) 05/12/2020 1011   CHOLHDL 4.1 05/12/2020 1011   VLDL 15 05/12/2020 1011   LDLCALC 110 (H) 05/12/2020  1011   HgbA1c:  Lab Results  Component Value Date   HGBA1C 5.2 05/12/2020   Urine Drug Screen: No results found for: LABOPIA, COCAINSCRNUR, LABBENZ, AMPHETMU, THCU, LABBARB  Alcohol Level     Component Value Date/Time   ETH <10 05/12/2020 0835    IMAGING  CT HEAD CODE STROKE WO CONTRAST 05/12/2020 IMPRESSION:  1. 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 Intraventricular penetration with blood in the left lateral ventricle. No ventricular obstruction.  2. ASPECTS is 10.   MRI brain WO Contrast - pending 05/13/2020  Chest 2 View 05/12/2020 IMPRESSION:  1. Moderate right-sided pleural effusion associated with right lung base parenchymal opacity, the latter consistent with atelectasis or pneumonia.  2. No other evidence of acute cardiopulmonary disease. No pulmonary edema.   ECHOCARDIOGRAM COMPLETE 05/12/2020 IMPRESSIONS   1. Left ventricular ejection fraction, by estimation, is 60 to 65%. The left ventricle has normal function. The left ventricle has no regional wall motion abnormalities. There is mild concentric left ventricular hypertrophy. Left ventricular diastolic parameters are consistent with Grade  I diastolic dysfunction (impaired relaxation).   2. Right ventricular systolic function is normal. The right ventricular size is normal. Tricuspid regurgitation signal is inadequate for assessing PA pressure.   3. The mitral valve is normal in structure. Trivial mitral valve regurgitation. No evidence of mitral stenosis.   4. The aortic valve is normal in structure. Aortic valve regurgitation is not visualized. No aortic stenosis is present.   5. The inferior vena cava is normal in size with greater than 50% respiratory variability, suggesting right atrial pressure of 3 mmHg.   Transthoracic Echocardiogram  00/00/2021 Pending  ECG - SR rate 67 BPM. (See cardiology reading for complete  details)  PHYSICAL EXAM Blood pressure 116/79, pulse (!) 56, temperature (!) 97.4 F (36.3 C), temperature source Axillary, resp. rate 14, weight 77.7 kg, SpO2 100 %. Obese middle-aged African-American male not in distress . Marland Kitchen Afebrile. Head is nontraumatic. Neck is supple without bruit.    Cardiac exam no murmur or gallop. Lungs are clear to auscultation. Distal pulses are well felt. Neurological Exam :  Awake alert oriented x2.  Diminished attention, registration and recall.  Speech is clear without dysarthria but some disfluency and word finding difficulties.  Extraocular movements appear full range with slight saccadic dysmetria on left lateral gaze.  Blinks to threat on both sides.  Right lower facial weakness.  Tongue midline.  Motor system exam shows right upper extremity strength 2/5 and significant weakness of right grip and intrinsic hand muscles with increased tone in the right arm.  Left upper extremity strength is normal.  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  Plantars both equivocal.  Gait not tested.  Sensation appears normal.   ASSESSMENT/PLAN [name] is a 64 y.o. male with history of spinal stenosis associated with right sided weakness at baseline causing him to use a wheelchair who presented to [facility] after he developed acute onset of worsened right sided weakness, confusion and new onset slurred speech. BP was 227/99 in CT. LKN was last night at 2330. 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 He did not receive IV t-PA due to ICH.    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  Resultant increased right arm weakness.  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  Code Stroke CT Head - Acute intraparenchymal hemorrhage in the left thalamus with measurements of 1.6 x 1.7 x 2.4 (volume = 3.4) Cm mild surrounding edema. Intraventricular  penetration with blood in the left lateral ventricle. No ventricular obstruction. ASPECTS is 10.     MRI head - pending  MRA head -pending  CTA H&N - not ordered   CT Perfusion - not ordered  Carotid Doppler - not ordered  2D Echo - pending  Sars Corona Virus 2 - negative  LDL - 110  HgbA1c - 5.2  UDS - pending  VTE prophylaxis - SCDs Diet  Diet Order            Diet regular Room service appropriate? Yes with Assist; Fluid consistency: Thin  Diet effective now              No antithrombotic prior to admission, now on No antithrombotic ICH  Ongoing aggressive stroke risk factor management  Therapy recommendations:  pending  Disposition:  Pending  Hypertension  Home BP meds: metoprolol  Current BP meds: metoprolol  Stable (SBP 114 - 130 today) . SBP goal < 140 mm  Hg initially . Long-term BP goal normotensive  Hyperlipidemia  Home Lipid lowering medication: none   LDL 110, goal < 70  Current lipid lowering medication: None (statin contraindicated with ICH)  May want to consider statin at discharge  Other Stroke Risk Factors  Advanced age  Cigarette smoker - not on file  ETOH use - not on file  Morbid obesity, There is no height or weight on file to calculate BMI., recommend weight loss, diet and exercise as appropriate   Family hx stroke - not on file  PMHX - not on file  Other Active Problems  Code status - Full code CKD - stage 3b - creatinine - 1.76->1.90  Mild anemia  Hospital day # 1 Patient presented with right arm weakness secondary to small left thalamic hemorrhage due to uncontrolled hypertension despite being on 4 different blood pressure medications.  He remains at significant risk for neurological worsening and hematoma expansion.  Recommend close neurological monitoring and strict blood pressure control with systolic blood pressure goal below 140 for first 24 hours and then below 160.  Wean Cleviprex drip and use IV  hydralazine 20 mg every 6 hourly as needed.  Continue home blood pressure medications hydrochlorothiazide, losartan, metoprolol and clonidine.  Check MRI scan of the brain and echocardiogram, lipid profile and hemoglobin A1c physical occupational therapy consults.  Long discussion with the patient and answered questions This patient is critically ill and at significant risk of neurological worsening, death and care requires constant monitoring of vital signs, hemodynamics,respiratory and cardiac monitoring, extensive review of multiple databases, frequent neurological assessment, discussion with family, other specialists and medical decision making of high complexity.I have made any additions or clarifications directly to the above note.This critical care time does not reflect procedure time, or teaching time or supervisory time of PA/NP/Med Resident etc but could involve care discussion time.  I spent 35 minutes of neurocritical care time  in the care of  this patient.   Antony Contras, MD Medical Director [place] Pager: [phone] 05/13/2020 2:01 PM  To contact Stroke Continuity provider, please refer to [email or web address] After hours, contact General Neurology

## 2020-05-13 NOTE — Evaluation (Signed)
Physical Therapy Evaluation Patient Details Name: Terry Barton MRN: [id number] DOB: June 21, 1956 Today's Date: 05/13/2020   History of Present Illness  64 y.o. male presenting to [facility] with acute onset of worsened right sided weakness and new onset slurred speech. LKN was last night at 2330. Per EMS, he has a history of spinal stenosis with associated right sided weakness at baseline and uses a wheelchair. 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  Clinical Impression  Pt presents to PT with deficits in ROM, strength, power, functional mobility, endurance, balance, cognition. Pt appears to have significant chronic deficits in mobility, spending most of his time in the bed and with significant contractures in all limbs. Pt will benefit from continued acute PT POC in an effort to improve ROM, strength, and ability to perform bed mobility in an effort to reduce caregiver burden. PT recommends discharge to SNF. If pt and family elect to discharge home then the pt will benefit from HHPT and a hospital bed.    Follow Up Recommendations SNF(HHPT if family elects to D/C home)    Equipment Recommendations  Hospital bed    Recommendations for Other Services       Precautions / Restrictions Precautions Precautions: Fall Restrictions Weight Bearing Restrictions: No      Mobility  Bed Mobility Overal bed mobility: Needs Assistance Bed Mobility: Rolling Rolling: Max assist;+2 for physical assistance         General bed mobility comments: pt is able to pull some with UE to maintain a rolled position, unable to produce any functional movement without assistance  Transfers                 General transfer comment: left room setup for lift transfer, RN requesting pt remain in bed due to planned MRI at 1PM  Ambulation/Gait                Stairs            Wheelchair Mobility    Modified Rankin (Stroke Patients Only) Modified  Rankin (Stroke Patients Only) Pre-Morbid Rankin Score: Severe disability Modified Rankin: Severe disability     Balance Overall balance assessment: (unable to assess)                                           Pertinent Vitals/Pain Pain Assessment: No/denies pain    Home Living Family/patient expects to be discharged to:: Private residence Living Arrangements: Children Available Help at Discharge: Family;Available PRN/intermittently Type of Home: Apartment Home Access: Level entry     Home Layout: Two level;Able to live on main level with bedroom/bathroom Home Equipment: Wheelchair - manual;Other (comment)(mechanical lift, power wheelchair is at his old home) Additional Comments: lives with son that works at funeral home, reports just moved 1 month ago, son does all ADL , pt reports that he does not have power chair with him currently. pt reports that son must push him    Prior Function Level of Independence: Needs assistance   Gait / Transfers Assistance Needed: uses power chair  ADL's / Homemaking Assistance Needed: sponge bath total (A) in bed  Comments: 5 years to lift to chair, son does all care no aides     Hand Dominance        Extremity/Trunk Assessment   Upper Extremity Assessment Upper Extremity Assessment: Defer to OT evaluation  RUE Deficits / Details: tone noted and unable to fully extend elbow. pt able to move to 90 degrees elbow extension, no abduction but with PROM 45 degrees abduction, digit activation noted but unable to grasp. pt noted to hold wrist in a flexed position with tighteness. pt denies splint use or prior deficits in hand or wrist.  RUE Sensation: decreased light touch;decreased proprioception LUE Deficits / Details: AROM digits wrist and elbow. pt with AAROM 90 degree flexion shoulder then reports discomfort. pt self feeding with L side    Lower Extremity Assessment Lower Extremity Assessment: RLE deficits/detail;LLE  deficits/detail RLE Deficits / Details: significant stiffness, R ankle with roughly 30 degrees PROM, flickers of AROM noted. R knee with ~10-20 degrees PROM noted, no AROM noted at knee. Pt does demonstrate some hip flexor activation although still weak with 2-/5 strength RLE Sensation: decreased light touch LLE Deficits / Details: AROM L ankle WFL, L knee slightly contracted into flexion, lacking 10-20 degrees knee extension, knee flexion to only roughly 40 degrees PROM, 2-/5 strength grossly for knee and hip LLE Sensation: decreased light touch    Cervical / Trunk Assessment Cervical / Trunk Assessment: Kyphotic  Communication   Communication: Expressive difficulties(may be related to cognitive deficits)  Cognition Arousal/Alertness: Awake/alert Behavior During Therapy: WFL for tasks assessed/performed Overall Cognitive Status: Impaired/Different from baseline Area of Impairment: Orientation;Memory;Problem solving                 Orientation Level: Disoriented to;Time   Memory: Decreased short-term memory       Problem Solving: Slow processing General Comments: pt believes it is sunday initially, is able to identify year with choice of 2      General Comments General comments (skin integrity, edema, etc.): VSS during session    Exercises General Exercises - Lower Extremity Ankle Circles/Pumps: PROM;Both;10 reps Short Arc Quad: PROM;Both;10 reps Hip ABduction/ADduction: PROM;Both;10 reps   Assessment/Plan    PT Assessment Patient needs continued PT services  PT Problem List Decreased strength;Decreased range of motion;Decreased activity tolerance;Decreased balance;Decreased mobility;Decreased cognition;Decreased knowledge of use of DME;Decreased safety awareness;Decreased knowledge of precautions;Impaired sensation;Impaired tone       PT Treatment Interventions DME instruction;Functional mobility training;Therapeutic activities;Therapeutic exercise;Balance  training;Neuromuscular re-education;Wheelchair mobility training;Patient/family education;Manual techniques;Modalities    PT Goals (Current goals can be found in the Care Plan section)  Acute Rehab PT Goals Patient Stated Goal: none stated, PT goal to improve bed mobility PT Goal Formulation: With patient Time For Goal Achievement: 05/27/20 Potential to Achieve Goals: Fair Additional Goals Additional Goal #1: Pt will mobilize in power wheelchair for 50' with minA    Frequency Min 3X/week   Barriers to discharge        Co-evaluation PT/OT/SLP Co-Evaluation/Treatment: Yes Reason for Co-Treatment: Complexity of the patient's impairments (multi-system involvement);Necessary to address cognition/behavior during functional activity;For patient/therapist safety;To address functional/ADL transfers PT goals addressed during session: Mobility/safety with mobility;Strengthening/ROM OT goals addressed during session: ADL's and self-care;Proper use of Adaptive equipment and DME;Strengthening/ROM       AM-PAC PT "6 Clicks" Mobility  Outcome Measure Help needed turning from your back to your side while in a flat bed without using bedrails?: Total Help needed moving from lying on your back to sitting on the side of a flat bed without using bedrails?: Total Help needed moving to and from a bed to a chair (including a wheelchair)?: Total Help needed standing up from a chair using your arms (e.g., wheelchair or bedside chair)?: Total Help needed to  walk in hospital room?: Total Help needed climbing 3-5 steps with a railing? : Total 6 Click Score: 6    End of Session   Activity Tolerance: Patient tolerated treatment well Patient left: in bed;with call bell/phone within reach;with bed alarm set Nurse Communication: Mobility status;Need for lift equipment PT Visit Diagnosis: Muscle weakness (generalized) (M62.81);Other abnormalities of gait and mobility (R26.89);Other symptoms and signs involving  the nervous system (R29.898)    Time: 3354-5625 PT Time Calculation (min) (ACUTE ONLY): 33 min   Charges:   PT Evaluation $PT Eval Moderate Complexity: 1 Mod          Zenaida Niece, PT, DPT Acute Rehabilitation Pager: [phone]   Zenaida Niece 05/13/2020, 1:16 PM

## 2020-05-14 MED ORDER — AMLODIPINE BESYLATE 5 MG PO TABS
5.0000 mg | ORAL_TABLET | Freq: Every day | ORAL | Status: DC
  Administered 2020-05-14 – 2020-05-17 (×4): 5 mg via ORAL
  Filled 2020-05-14 (×4): qty 1

## 2020-05-14 MED ORDER — LABETALOL HCL 5 MG/ML IV SOLN
20.0000 mg | INTRAVENOUS | Status: DC | PRN
  Administered 2020-05-14: 20 mg via INTRAVENOUS

## 2020-05-14 MED ORDER — HYDRALAZINE HCL 20 MG/ML IJ SOLN
20.0000 mg | Freq: Four times a day (QID) | INTRAMUSCULAR | Status: DC | PRN
  Administered 2020-05-15: 20 mg via INTRAVENOUS
  Filled 2020-05-14: qty 1

## 2020-05-14 NOTE — Evaluation (Signed)
Speech Language Pathology Evaluation Patient Details Name: Delquan Poucher MRN: [id number] DOB: [birth year] Today's Date: 05/14/2020 Time: 5916-3846 SLP Time Calculation (min) (ACUTE ONLY): 24 min  Problem List:  Patient Active Problem List   Diagnosis Date Noted  . ICH (intracerebral hemorrhage) (HCC) 05/12/2020   Past Medical History: No past medical history on file. Past Surgical History:  The histories are not reviewed yet. Please review them in the "History" navigator section and refresh this SmartLink. HPI:  64 y.o. male presenting to [facility] with acute onset of worsened right sided weakness and new onset slurred speech. LKN was last night at 2330. Per EMS, he has a history of spinal stenosis with associated right sided weakness at baseline and uses a wheelchair. 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   Assessment / Plan / Recommendation Clinical Impression  Pt says he has been feeling "confused" lately and has difficulty answering mildly complex yes/no questions. He follows one-step commands well given extra time and occasional repetitions, but will only follow one step when given two-step instructions. He can tell me his name but not his birthday and is otherwise only oriented to location. Although his confrontational naming is intact, he can only name 1-2 words in a divergent naming task and seems to have word-finding errors in communication as he seems to lose his attention. Pt will benefit from SLP f/u to maximize cognition, communication, and safety.     SLP Assessment  SLP Recommendation/Assessment: Patient needs continued Speech Lanaguage Pathology Services SLP Visit Diagnosis: Cognitive communication deficit (R41.841)    Follow Up Recommendations  Skilled Nursing facility(vs [facility] with 24/7 supervision)    Frequency and Duration min 2x/week  2 weeks      SLP Evaluation Cognition  Overall Cognitive Status: Impaired/Different from  baseline Arousal/Alertness: Awake/alert Orientation Level: Oriented to person;Disoriented to place;Disoriented to situation;Disoriented to time Attention: Sustained Sustained Attention: Impaired Sustained Attention Impairment: Verbal basic Memory: Impaired Memory Impairment: Decreased recall of new information Problem Solving: Impaired Problem Solving Impairment: Verbal basic;Functional basic       Comprehension  Auditory Comprehension Overall Auditory Comprehension: Impaired Yes/No Questions: Impaired Basic Biographical Questions: 76-100% accurate Basic Immediate Environment Questions: 75-100% accurate Complex Questions: 50-74% accurate Commands: Impaired One Step Basic Commands: 75-100% accurate Two Step Basic Commands: 0-24% accurate    Expression Expression Primary Mode of Expression: Verbal Verbal Expression Overall Verbal Expression: Impaired Initiation: No impairment Automatic Speech: Name;Social Response Level of Generative/Spontaneous Verbalization: Sentence Repetition: Impaired Level of Impairment: Sentence level Naming: Impairment Confrontation: Within functional limits Divergent: 0-24% accurate Non-Verbal Means of Communication: Not applicable   Oral / Motor  Motor Speech Overall Motor Speech: (dysfluencies)   GO                    Mahala Menghini., M.A. CCC-SLP Acute Rehabilitation Services Pager [phone] Office [phone]  05/14/2020, 1:05 PM

## 2020-05-14 NOTE — Progress Notes (Signed)
STROKE TEAM PROGRESS NOTE      INTERVAL HISTORY His RN is at the bedside.  Patient is awake and sitting up in bed eating breakfast.  He states his right-sided weakness is unchanged.    He is still on IV Cleviprex drip for blood pressure control.  Despite getting IV hydralazine and labetalol.  Neurological exam is unchanged.  MRI scan yesterday showed stable appearance of the left thalamic hemorrhage without significant midline shift.  Echocardiogram was unremarkable.   OBJECTIVE Vitals:   05/14/20 1200 05/14/20 1300 05/14/20 1400 05/14/20 1500  BP: (!) 166/75 (!) 147/63 (!) 164/77 (!) 147/68  Pulse: 80 73 72 80  Resp: 19 (!) 22 14 (!) 27  Temp: 98.7 F (37.1 C)     TempSrc:      SpO2: 100% 100% 100% 100%  Weight:        CBC:  Recent Labs  Lab 05/12/20 0839 05/12/20 0844  WBC 7.9  --   NEUTROABS 5.0  --   HGB 11.4* 12.6*  HCT 36.3* 37.0*  MCV 100.8*  --   PLT 266  --     Basic Metabolic Panel:  Recent Labs  Lab 05/12/20 0839 05/12/20 0844  NA 139 141  K 3.7 3.7  CL 103 103  CO2 27  --   GLUCOSE 136* 131*  BUN 16 19  CREATININE 1.76* 1.90*  CALCIUM 8.8*  --     Lipid Panel:     Component Value Date/Time   CHOL 165 05/12/2020 1011   TRIG 74 05/12/2020 1011   HDL 40 (L) 05/12/2020 1011   CHOLHDL 4.1 05/12/2020 1011   VLDL 15 05/12/2020 1011   LDLCALC 110 (H) 05/12/2020 1011   HgbA1c:  Lab Results  Component Value Date   HGBA1C 5.Barton 05/12/2020   Urine Drug Screen: No results found for: LABOPIA, COCAINSCRNUR, LABBENZ, AMPHETMU, THCU, LABBARB  Alcohol Level     Component Value Date/Time   ETH <10 05/12/2020 0835    IMAGING  CT HEAD CODE STROKE WO CONTRAST 05/12/2020 IMPRESSION:  1. Acute intraparenchymal hemorrhage in the left thalamus with measurements of 1.6 x 1.7 x Barton.4 (volume = 3.4) Cm mild surrounding edema. Intraventricular penetration with blood in the left lateral ventricle. No ventricular obstruction.  Barton. ASPECTS is 10.   MRI brain WO  Contrast - Unchanged size of an acute left thalamic parenchymal hemorrhage. Surrounding edema has also not significantly changed. No significant ventricular effacement. No midline shift Similar appearance of ventricular extension into the left lateral ventricle and small amount of new right occipital horn hemorrhage.  Chest Barton View 05/12/2020 IMPRESSION:  1. Moderate right-sided pleural effusion associated with right lung base parenchymal opacity, the latter consistent with atelectasis or pneumonia.  Barton. No other evidence of acute cardiopulmonary disease. No pulmonary edema.   ECHOCARDIOGRAM COMPLETE 05/12/2020 IMPRESSIONS   1. Left ventricular ejection fraction, by estimation, is 60 to 65%. The left ventricle has normal function. The left ventricle has no regional wall motion abnormalities. There is mild concentric left ventricular hypertrophy. Left ventricular diastolic parameters are consistent with Grade I diastolic dysfunction (impaired relaxation).   Barton. Right ventricular systolic function is normal. The right ventricular size is normal. Tricuspid regurgitation signal is inadequate for assessing PA pressure.   3. The mitral valve is normal in structure. Trivial mitral valve regurgitation. No evidence of mitral stenosis.   4. The aortic valve is normal in structure. Aortic valve regurgitation is not visualized. No aortic stenosis is present.  5. The inferior vena cava is normal in size with greater than 50% respiratory variability, suggesting right atrial pressure of 3 mmHg.   Transthoracic Echocardiogram  Normal left ventricular ejection fraction of 60 to 65%.  No cardiac source of embolism.   ECG - SR rate 67 BPM. (See cardiology reading for complete details)  PHYSICAL EXAM Blood pressure (!) 147/68, pulse 80, temperature 98.7 F (37.1 C), resp. rate (!) 27, weight 77.7 kg, SpO2 100 %. Obese middle-aged African-American male not in distress . Marland Kitchen Afebrile. Head is nontraumatic. Neck is  supple without bruit.    Cardiac exam no murmur or gallop. Lungs are clear to auscultation. Distal pulses are well felt. Neurological Exam :  Awake alert oriented x2.  Diminished attention, registration and recall.  Speech is clear without dysarthria but some hesitancy and word finding difficulties.  Extraocular movements appear full range with slight saccadic dysmetria on left lateral gaze.  Blinks to threat on both sides.  Right lower facial weakness.  Tongue midline.  Motor system exam shows right upper extremity strength Barton/5 and significant weakness of right grip and intrinsic hand muscles with increased tone in the right arm.  Left upper extremity strength is normal.  Bilateral paraparesis with Barton/5 strength proximally and distally in both legs with increased tone and brisk reflexes.  Plantars both equivocal.  Gait not tested.  Sensation appears normal.   ASSESSMENT/PLAN [name] is a 64 y.o. male with history of spinal stenosis associated with right sided weakness at baseline causing him to use a wheelchair who presented to [facility] after he developed acute onset of worsened right sided weakness, confusion and new onset slurred speech. BP was 227/99 in CT. LKN was last night at 2330. 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 He did not receive IV t-PA due to ICH.    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  Resultant increased right arm weakness.  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  Code Stroke CT Head - Acute intraparenchymal hemorrhage in the left thalamus with measurements of 1.6 x 1.7 x Barton.4 (volume = 3.4) Cm mild surrounding edema. Intraventricular penetration with blood in the left lateral ventricle. No ventricular obstruction. ASPECTS is 10.     MRI head -stable thalamic hemorrhage with mild intraventricular extension but no hydrocephalus or midline shift.  MRA head -moderate bilateral M2 stenosis CTA H&N - not ordered    CT Perfusion - not ordered  Carotid Doppler - not ordered  2D Echo -normal ejection fraction.  No cardiac source of embolism Terry Barton Terry Barton - negative  LDL - 110  HgbA1c - 5.Barton  UDS -not sent  VTE prophylaxis - SCDs Diet  Diet Order            Diet regular Room service appropriate? Yes with Assist; Fluid consistency: Thin  Diet effective now              No antithrombotic prior to admission, now on No antithrombotic ICH  Ongoing aggressive stroke risk factor management  Therapy recommendations: SNF  Disposition:  Pending  Hypertension  Home BP meds: metoprolol  Current BP meds: metoprolol  Stable (SBP 114 - 130 today) . SBP goal < 140 mm Hg initially . Long-term BP goal normotensive  Hyperlipidemia  Home Lipid lowering medication: none   LDL 110, goal < 70  Current lipid lowering medication: None (statin contraindicated with ICH)  May want to consider statin at discharge  Other  Stroke Risk Factors  Advanced age  Cigarette smoker - not on file  ETOH use - not on file  Morbid obesity, There is no height or weight on file to calculate BMI., recommend weight loss, diet and exercise as appropriate   Family hx stroke - not on file  PMHX - not on file  Other Active Problems  Code status - Full code CKD - stage 3b - creatinine - 1.76->1.90  Mild anemia  Hospital day # Barton Patient presented with right arm weakness secondary to small left thalamic hemorrhage due to uncontrolled hypertension despite being on 4 different blood pressure medications.  He remains at significant risk for neurological worsening and hematoma expansion.  Recommend close neurological monitoring and strict blood pressure control with systolic blood pressure goal below  160.  Wean Cleviprex drip and use IV hydralazine 20 mg every 6 hourly and labetalol 20 mg every Barton hourly as needed.  Continue home blood pressure medications hydrochlorothiazide, losartan, metoprolol and  clonidine.  Start Norvasc 5 mg daily.  Rober Minion out of bed.  Therapy and rehab consults long discussion with the patient and answered questions This patient is critically ill and at significant risk of neurological worsening, death and care requires constant monitoring of vital signs, hemodynamics,respiratory and cardiac monitoring, extensive review of multiple databases, frequent neurological assessment, discussion with family, other specialists and medical decision making of high complexity.I have made any additions or clarifications directly to the above note.This critical care time does not reflect procedure time, or teaching time or supervisory time of PA/NP/Med Resident etc but could involve care discussion time.  I spent 32 minutes of neurocritical care time  in the care of  this patient.   Delia Heady, MD Medical Director [facility] Pager: [phone] 05/14/2020 3:31 PM  To contact Stroke Continuity provider, please refer to [email or web address]. After hours, contact General Neurology

## 2020-05-14 NOTE — TOC Initial Note (Signed)
Transition of Care [facility]) - Initial/Assessment Note    Patient Details  Name: Terry Barton MRN: [id number] Date of Birth: 02-06-1956  [facility] CM/SW Contact:    Glennon Mac, RN Phone Number: 05/14/2020, 12:27 PM Clinical Narrative:                 64 y.o. male presenting to [facility] with acute onset of worsened right sided weakness and new onset slurred speech. LKN was last night at 2330. Per EMS, he has a history of spinal stenosis with associated right sided weakness at baseline and uses a wheelchair. 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 PTA, pt wheelchair bound at home and lives at home with his son, Terry Barton, and brother.  Son and brother provide all care for him, including ROM exercises for all extremities.  He has power WC and hoyer lift at home.  Terry Barton denies need for any [facility] services or DME.  He does not want pt placed in a facility as recommended by therapy; pt has been disabled since 2016, and states they are doing well at home.   Expected Discharge Plan: Home/Self Care Barriers to Discharge: Continued Medical Work up           Expected Discharge Plan and Services Expected Discharge Plan: Home/Self Care   Discharge Planning Services: CM Consult   Living arrangements for the past 2 months: Single Family Home                                      Prior Living Arrangements/Services Living arrangements for the past 2 months: Single Family Home Lives with:: Adult Children, Siblings Patient language and need for interpreter reviewed:: Yes Do you feel safe going back to the place where you live?: Yes      Need for Family Participation in Patient Care: Yes (Comment) Care giver support system in place?: Yes (comment) Current home services: DME(Power WC, Hoyer lift) Criminal Activity/Legal Involvement Pertinent to Current Situation/Hospitalization: No - Comment as needed                 Emotional  Assessment Appearance:: Appears stated age Attitude/Demeanor/Rapport: Lethargic Affect (typically observed): Appropriate Orientation: : Oriented to Self, Oriented to Place, Oriented to Situation      Admission diagnosis:  Hemorrhagic stroke (HCC) [I61.9] ICH (intracerebral hemorrhage) (HCC) [I61.9] Patient Active Problem List   Diagnosis Date Noted  . ICH (intracerebral hemorrhage) (HCC) 05/12/2020   PCP:  No primary care provider on file. Pharmacy:   [id number] - [place], [place] - [address] AT [facility] [address] [place] [address] Phone: [phone] Fax: [phone]     Social Determinants of Health (SDOH) Interventions    Readmission Risk Interventions No flowsheet data found.  Quintella Baton, RN, BSN  Trauma/Neuro ICU Case Manager [phone]

## 2020-05-15 MED ORDER — ENOXAPARIN SODIUM 40 MG/0.4ML ~~LOC~~ SOLN
40.0000 mg | SUBCUTANEOUS | Status: DC
  Administered 2020-05-15 – 2020-05-17 (×3): 40 mg via SUBCUTANEOUS
  Filled 2020-05-15 (×3): qty 0.4

## 2020-05-15 MED ORDER — FERROUS SULFATE 325 (65 FE) MG PO TABS
325.0000 mg | ORAL_TABLET | Freq: Three times a day (TID) | ORAL | Status: DC
  Administered 2020-05-15 – 2020-05-17 (×7): 325 mg via ORAL
  Filled 2020-05-15 (×7): qty 1

## 2020-05-15 NOTE — Plan of Care (Signed)
  Problem: Education: Goal: Knowledge of General Education information will improve Description: Including pain rating scale, medication(s)/side effects and non-pharmacologic comfort measures Outcome: Progressing   Problem: Health Behavior/Discharge Planning: Goal: Ability to manage health-related needs will improve Outcome: Progressing   Problem: Clinical Measurements: Goal: Ability to maintain clinical measurements within normal limits will improve Outcome: Progressing Goal: Will remain free from infection Outcome: Progressing Goal: Diagnostic test results will improve Outcome: Progressing Goal: Respiratory complications will improve Outcome: Progressing Goal: Cardiovascular complication will be avoided Outcome: Progressing   Problem: Activity: Goal: Risk for activity intolerance will decrease Outcome: Progressing   Problem: Nutrition: Goal: Adequate nutrition will be maintained Outcome: Progressing   Problem: Coping: Goal: Level of anxiety will decrease Outcome: Progressing   Problem: Elimination: Goal: Will not experience complications related to bowel motility Outcome: Progressing Goal: Will not experience complications related to urinary retention Outcome: Progressing   Problem: Pain Managment: Goal: General experience of comfort will improve Outcome: Progressing   Problem: Safety: Goal: Ability to remain free from injury will improve Outcome: Progressing   Problem: Skin Integrity: Goal: Risk for impaired skin integrity will decrease Outcome: Progressing   Problem: Education: Goal: Knowledge of disease or condition will improve Outcome: Progressing Goal: Knowledge of secondary prevention will improve Outcome: Progressing Goal: Knowledge of patient specific risk factors addressed and post discharge goals established will improve Outcome: Progressing Goal: Individualized Educational Video(s) Outcome: Progressing   Problem: Coping: Goal: Will verbalize  positive feelings about self Outcome: Progressing Goal: Will identify appropriate support needs Outcome: Progressing   Problem: Health Behavior/Discharge Planning: Goal: Ability to manage health-related needs will improve Outcome: Progressing   Problem: Self-Care: Goal: Ability to participate in self-care as condition permits will improve Outcome: Progressing Goal: Verbalization of feelings and concerns over difficulty with self-care will improve Outcome: Progressing Goal: Ability to communicate needs accurately will improve Outcome: Progressing   Problem: Nutrition: Goal: Risk of aspiration will decrease Outcome: Progressing Goal: Dietary intake will improve Outcome: Progressing   Problem: Intracerebral Hemorrhage Tissue Perfusion: Goal: Complications of Intracerebral Hemorrhage will be minimized Outcome: Progressing   

## 2020-05-15 NOTE — Progress Notes (Signed)
STROKE TEAM PROGRESS NOTE   INTERVAL HISTORY Patient is sitting up in bed and eating breakfast.  He states he is doing well.  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  Blood pressure adequately controlled.  Patient has transfer orders to the floor but no bed available.  No neurological changes.  Vital signs stable.   OBJECTIVE Vitals:   05/15/20 0600 05/15/20 0700 05/15/20 0800 05/15/20 0900  BP: (!) 142/58 (!) 151/59 (!) 148/57 (!) 181/67  Pulse: 68 65 64 69  Resp: 19 15 12 15   Temp:   97.9 F (36.6 C)   TempSrc:      SpO2: 99% 98% 100% 100%  Weight:        CBC:  Recent Labs  Lab 05/12/20 0839 05/12/20 0844  WBC 7.9  --   NEUTROABS 5.0  --   HGB 11.4* 12.6*  HCT 36.3* 37.0*  MCV 100.8*  --   PLT 266  --     Basic Metabolic Panel:  Recent Labs  Lab 05/12/20 0839 05/12/20 0844  NA 139 141  K 3.7 3.7  CL 103 103  CO2 27  --   GLUCOSE 136* 131*  BUN 16 19  CREATININE 1.76* 1.90*  CALCIUM 8.8*  --     IMAGING No results found.   PHYSICAL EXAM    Blood pressure (!) 181/67, pulse 69, temperature 97.9 F (36.6 C), resp. rate 15, weight 77.7 kg, SpO2 100 %. Obese middle-aged African-American male not in distress . Marland Kitchen Afebrile. Head is nontraumatic. Neck is supple without bruit.    Cardiac exam no murmur or gallop. Lungs are clear to auscultation. Distal pulses are well felt. Neurological Exam :  Awake alert oriented x2.  Diminished attention, registration and recall.  Speech is clear without dysarthria but some hesitancy and word finding difficulties.  Extraocular movements appear full range with slight saccadic dysmetria on left lateral gaze.  Blinks to threat on both sides.  Right lower facial weakness.  Tongue midline.  Motor system exam shows right upper extremity strength 2/5 and significant weakness of right grip and intrinsic hand muscles with increased tone in the right arm.  Left upper extremity strength is  normal.  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  Plantars both equivocal.  Gait not tested.  Sensation appears normal.   ASSESSMENT/PLAN Mr. Izek Corvino is a 64 y.o. male with history of spinal stenosis associated with right sided weakness at baseline causing him to use a wheelchair who presented to [facility] after he developed acute onset of worsened right sided weakness, confusion and new onset slurred speech. BP was 227/99 in CT. LKN was last night at 2330. 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 He did not receive IV t-PA due to [place].   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  Resultant increased right arm weakness.  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  Code Stroke CT Head - Acute intraparenchymal hemorrhage in the left thalamus with measurements of 1.6 x 1.7 x 2.4 (volume = 3.4) Cm mild surrounding edema. Intraventricular penetration with blood in the left lateral ventricle. No ventricular obstruction. ASPECTS is 10.     MRI head -stable thalamic hemorrhage with mild intraventricular extension but no hydrocephalus or midline shift.  MRA head -moderate bilateral M2 stenosis CTA H&N - not ordered   2D Echo -normal ejection fraction.  No  cardiac source of embolism Loyal Jacobson Virus 2 - negative  LDL - 110  HgbA1c - 5.2  VTE prophylaxis - changed to Lovenox 40 mg sq daily   No antithrombotic prior to admission, now on No antithrombotic ICH  Therapy recommendations: SNF  Disposition:  Pending - w/c bound, lived w/ son and brother [name]. Disabled since 2015  Transfer to the floor  Hypertension  Treated with cleviprexHome BP meds: metoprolol  Current BP meds: metoprolol 25 bid, norvasc 5, HCTZ 25    Stable  . SBP goal < 160 mm Hg  . Long-term BP goal normotensive  Hyperlipidemia  Home Lipid lowering medication: none   LDL  110  Current lipid lowering medication: None (statin contraindicated with ICH)  Consider statin at discharge  Other Stroke Risk Factors  Advanced age  Other Active Problems  CKD - stage 3b - creatinine - 1.76->1.90 . Check labs in am  Mild anemia. Resume Fe  Loose stools on senna. D/c senna.   Hospital day # 3 Continue close neurological monitoring and strict blood pressure control.  Mobilize out of bed.  Continue ongoing therapies.  Transfer to neurology floor bed when available.  Transferred to rehabilitation and skilled nursing facility over the next few days after insurance approval.  Greater than 50% time during this 25-minute visit was spent on counseling and coordination of care and discussion with care team and answering questions.  Delia Heady, MD Medical Director [facility] Pager: [phone] 05/15/2020 9:33 AM  To contact Stroke Continuity provider, please refer to [email or web address]. After hours, contact General Neurology

## 2020-05-16 LAB — BASIC METABOLIC PANEL
Anion gap: 9 (ref 5–15)
BUN: 25 mg/dL — ABNORMAL HIGH (ref 8–23)
CO2: 22 mmol/L (ref 22–32)
Calcium: 8.1 mg/dL — ABNORMAL LOW (ref 8.9–10.3)
Chloride: 102 mmol/L (ref 98–111)
Creatinine, Ser: 2.14 mg/dL — ABNORMAL HIGH (ref 0.61–1.24)
GFR calc Af Amer: 36 mL/min — ABNORMAL LOW (ref >60)
GFR calc non Af Amer: 31 mL/min — ABNORMAL LOW (ref >60)
Glucose, Bld: 99 mg/dL (ref 70–99)
Potassium: 3.2 mmol/L — ABNORMAL LOW (ref 3.5–5.1)
Sodium: 133 mmol/L — ABNORMAL LOW (ref 135–145)

## 2020-05-16 LAB — CBC
HCT: 31.1 % — ABNORMAL LOW (ref 39.0–52.0)
Hemoglobin: 10 g/dL — ABNORMAL LOW (ref 13.0–17.0)
MCH: 31.4 pg (ref 26.0–34.0)
MCHC: 32.2 g/dL (ref 30.0–36.0)
MCV: 97.8 fL (ref 80.0–100.0)
Platelets: 240 10*3/uL (ref 150–400)
RBC: 3.18 MIL/uL — ABNORMAL LOW (ref 4.22–5.81)
RDW: 13.8 % (ref 11.5–15.5)
WBC: 14.4 10*3/uL — ABNORMAL HIGH (ref 4.0–10.5)
nRBC: 0 % (ref 0.0–0.2)

## 2020-05-16 MED ORDER — POTASSIUM CHLORIDE CRYS ER 20 MEQ PO TBCR
20.0000 meq | EXTENDED_RELEASE_TABLET | ORAL | Status: AC
  Administered 2020-05-16 (×2): 20 meq via ORAL
  Filled 2020-05-16 (×2): qty 1

## 2020-05-16 MED ORDER — SODIUM CHLORIDE 0.9 % IV SOLN: INTRAVENOUS | Status: DC

## 2020-05-16 NOTE — Progress Notes (Signed)
Occupational Therapy Treatment Patient Details Name: Terry Barton MRN: [id number] DOB: 02/20/1956 Today's Date: 05/16/2020    History of present illness 65 y.o. male presenting to [facility] with acute onset of worsened right sided weakness and new onset slurred speech. LKN was last night at 2330. Per EMS, he has a history of spinal stenosis with associated right sided weakness at baseline and uses a wheelchair. 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   OT comments  Pt seen in conjunction with PT for functional mobility progression d/t pts decreased activity tolerance. Pt currently requires total A +2 for all aspects of bed mobility.  Pt required total A for pericare as pt noted to be soiled in bed. Utilized maximove to transition OOB to chair. Pt continues to present with increased tone in RUE; completed PROM to pts R elbow, wrist, and hand. Pt reports using hoyer lift at home to transition OOB to [facility] and reports wearing depends at home with family returning pt to supine for pericare. ? Of whether PWC is in [place] or in [place] where pt is from. Agree with DC plan below, will follow acutely per POC.   Follow Up Recommendations  SNF    Equipment Recommendations  Wheelchair cushion (measurements OT);Wheelchair (measurements OT);Hospital bed;Other (comment)(hoyer)    Recommendations for Other Services      Precautions / Restrictions Precautions Precautions: Fall Restrictions Weight Bearing Restrictions: No       Mobility Bed Mobility Overal bed mobility: Needs Assistance Bed Mobility: Rolling Rolling: +2 for physical assistance;Total assist         General bed mobility comments: multimodal cues for all sequencing; pt able to assist minimally with L UE to maintain side lying  Transfers Overall transfer level: Needs assistance Equipment used: Ambulation equipment used             General transfer comment: total A for maximove  to chair    Balance Overall balance assessment: (unable to assess)                                         ADL either performed or assessed with clinical judgement   ADL Overall ADL's : Needs assistance/impaired Eating/Feeding: Set up;Bed level Eating/Feeding Details (indicate cue type and reason): pt requires everyting placed toward L side and  set up                     Toilet Transfer: Total assistance;+2 for physical assistance Toilet Transfer Details (indicate cue type and reason): maxi move         Functional mobility during ADLs: Total assistance;+2 for physical assistance;+2 for safety/equipment(bed mobility; maxi move to chair) General ADL Comments: pt currently requires total A +2 for all bed mobility; maxi move to chair; total A for all ADLs. Pt continues to be limited by increased flexor tone in RUE     Vision       Perception     Praxis      Cognition Arousal/Alertness: Awake/alert Behavior During Therapy: Flat affect;WFL for tasks assessed/performed Overall Cognitive Status: Impaired/Different from baseline Area of Impairment: Problem solving                     Memory: Decreased short-term memory       Problem Solving: Slow processing;Requires verbal cues;Requires tactile cues General Comments: very  flat throughout session; needing increased encouragement to verbalize needs        Exercises Exercises: Other exercises Other Exercises Other Exercises: Passive elbow; hand, and wrist ROM to RUE   Shoulder Instructions       General Comments      Pertinent Vitals/ Pain       Pain Assessment: Faces Faces Pain Scale: Hurts little more Pain Location: generalized with ROM of extremities (> with R elbow extension) Pain Descriptors / Indicators: Grimacing Pain Intervention(s): Limited activity within patient's tolerance;Monitored during session;Repositioned  Home Living                                           Prior Functioning/Environment              Frequency  Min 2X/week        Progress Toward Goals  OT Goals(current goals can now be found in the care plan section)  Progress towards OT goals: Progressing toward goals  Acute Rehab OT Goals Patient Stated Goal: none stated, PT goal to improve bed mobility OT Goal Formulation: With patient Time For Goal Achievement: 05/27/20 Potential to Achieve Goals: Good  Plan Discharge plan remains appropriate    Co-evaluation    PT/OT/SLP Co-Evaluation/Treatment: Yes Reason for Co-Treatment: For patient/therapist safety;To address functional/ADL transfers PT goals addressed during session: Mobility/safety with mobility;Strengthening/ROM OT goals addressed during session: ADL's and self-care      AM-PAC OT "6 Clicks" Daily Activity     Outcome Measure   Help from another person eating meals?: A Little Help from another person taking care of personal grooming?: Total Help from another person toileting, which includes using toliet, bedpan, or urinal?: Total Help from another person bathing (including washing, rinsing, drying)?: Total Help from another person to put on and taking off regular upper body clothing?: Total Help from another person to put on and taking off regular lower body clothing?: Total 6 Click Score: 8    End of Session Equipment Utilized During Treatment: Other (comment)(maximove)  OT Visit Diagnosis: Muscle weakness (generalized) (M62.81)   Activity Tolerance Patient tolerated treatment well   Patient Left in chair;with call bell/phone within reach;with chair alarm set   Nurse Communication Mobility status;Need for lift equipment        Time: 9323-5573 OT Time Calculation (min): 45 min  Charges: OT General Charges $OT Visit: 1 Visit OT Treatments $Self Care/Home Management : 23-37 mins  Lanier Clam., COTA/L Acute Rehabilitation Services [phone] [place] 05/16/2020, 2:05 PM

## 2020-05-16 NOTE — Discharge Summary (Addendum)
Stroke Discharge Summary  Patient ID: Terry Barton   MRN: [id number]      DOB: 1956-10-31  Date of Admission: 05/12/2020 Date of Discharge: 05/16/2020  Attending Physician:  Micki Riley, MD, Stroke MD Consultant(s):     None  Patient's PCP:  No primary care provider on file.  DISCHARGE DIAGNOSIS:    Principal Problem:   ICH (intracerebral hemorrhage) (HCC) d/t HTN Active Problems:   Essential hypertension   Hyperlipidemia   CKD (chronic kidney disease), stage III   Hypokalemia   Allergies as of 05/17/2020   No Known Allergies      Medication List     TAKE these medications    amLODipine 5 MG tablet Commonly known as: NORVASC Take 1 tablet (5 mg total) by mouth daily. Start taking on: May 18, 2020   cyproheptadine 4 MG tablet Commonly known as: PERIACTIN Take by mouth 3 (three) times daily.   ferrous sulfate 325 (65 FE) MG EC tablet Take 325 mg by mouth 3 (three) times daily with meals.   hydrochlorothiazide 25 MG tablet Commonly known as: HYDRODIURIL Take 1 tablet (25 mg total) by mouth daily. Start taking on: May 18, 2020   metoprolol tartrate 25 MG tablet Commonly known as: LOPRESSOR Take by mouth 2 (two) times daily.   traZODone 50 MG tablet Commonly known as: DESYREL Take 50 mg by mouth at bedtime as needed for sleep.   Vitamin C 500 MG Caps Take 1 capsule by mouth daily.        LABORATORY STUDIES CBC    Component Value Date/Time   WBC 14.4 (H) 05/16/2020 0414   RBC 3.18 (L) 05/16/2020 0414   HGB 10.0 (L) 05/16/2020 0414   HCT 31.1 (L) 05/16/2020 0414   PLT 240 05/16/2020 0414   MCV 97.8 05/16/2020 0414   MCH 31.4 05/16/2020 0414   MCHC 32.2 05/16/2020 0414   RDW 13.8 05/16/2020 0414   LYMPHSABS 2.0 05/12/2020 0839   MONOABS 0.6 05/12/2020 0839   EOSABS 0.2 05/12/2020 0839   BASOSABS 0.0 05/12/2020 0839   CMP    Component Value Date/Time   NA 133 (L) 05/16/2020 0414   K 3.2 (L) 05/16/2020 0414   CL 102 05/16/2020 0414    CO2 22 05/16/2020 0414   GLUCOSE 99 05/16/2020 0414   BUN 25 (H) 05/16/2020 0414   CREATININE 2.14 (H) 05/16/2020 0414   CALCIUM 8.1 (L) 05/16/2020 0414   PROT 7.0 05/12/2020 0839   ALBUMIN 2.9 (L) 05/12/2020 0839   AST 24 05/12/2020 0839   ALT 17 05/12/2020 0839   ALKPHOS 83 05/12/2020 0839   BILITOT 0.6 05/12/2020 0839   GFRNONAA 31 (L) 05/16/2020 0414   GFRAA 36 (L) 05/16/2020 0414   COAGS Lab Results  Component Value Date   INR 1.1 05/12/2020   INR 1.1 05/12/2020   Lipid Panel    Component Value Date/Time   CHOL 165 05/12/2020 1011   TRIG 74 05/12/2020 1011   HDL 40 (L) 05/12/2020 1011   CHOLHDL 4.1 05/12/2020 1011   VLDL 15 05/12/2020 1011   LDLCALC 110 (H) 05/12/2020 1011   HgbA1C  Lab Results  Component Value Date   HGBA1C 5.2 05/12/2020   Alcohol Level    Component Value Date/Time   ETH <10 05/12/2020 0835     SIGNIFICANT DIAGNOSTIC STUDIES Chest 2 View  Result Date: 05/12/2020 CLINICAL DATA:  New onset slurred speech and right arm and right leg weakness. EXAM: CHEST -  2 VIEW COMPARISON:  None. FINDINGS: There is right-sided opacity extending from the mid lung to the right lung base, obscuring the right hemidiaphragm and portions of the right heart border. In part this is due to a moderate pleural effusion. There is associated parenchymal opacity that is consistent with atelectasis or infection. Remainder of the lungs is clear. No left pleural effusion. No pneumothorax. Cardiac silhouette is top-normal in size. No mediastinal or hilar masses or convincing adenopathy. Skeletal structures are grossly intact. IMPRESSION: 1. Moderate right-sided pleural effusion associated with right lung base parenchymal opacity, the latter consistent with atelectasis or pneumonia. 2. No other evidence of acute cardiopulmonary disease. No pulmonary edema. Electronically Signed   By: Amie Portland M.D.   On: 05/12/2020 11:08   MR ANGIO HEAD WO CONTRAST  Result Date:  05/13/2020 CLINICAL DATA:  Stroke, follow-up. EXAM: MRI HEAD WITHOUT CONTRAST MRA HEAD WITHOUT CONTRAST TECHNIQUE: Multiplanar, multiecho pulse sequences of the brain and surrounding structures were obtained without intravenous contrast. Angiographic images of the head were obtained using MRA technique without contrast. COMPARISON:  Noncontrast head CT performed earlier the same day 05/12/2020 FINDINGS: MRI HEAD FINDINGS Brain: Mild intermittent motion degradation. An acute parenchymal hemorrhage centered within the left thalamus has not significantly changed in size, measuring 1.7 x 1.8 cm in transaxial dimensions. Similar appearance of surrounding edema. There is no significant ventricular effacement. No midline shift. Intraventricular extension into the left lateral ventricle does not appear significantly changed. However, there is a new small amount of hemorrhage within the right occipital horn. No hydrocephalus at this time. Mild patchy T2/FLAIR hyperintensity within the cerebral white matter is nonspecific, but consistent with chronic small vessel ischemic disease. Redemonstrated mild generalized parenchymal atrophy. No evidence of intracranial mass. No extra-axial fluid collection. Vascular: Reported below. Skull and upper cervical spine: No focal marrow lesion. Sinuses/Orbits: Visualized orbits show no acute finding. Mild ethmoid, sphenoid and maxillary sinus mucosal thickening. Trace bilateral mastoid effusions. MRA HEAD FINDINGS The examination is mild to moderately motion degraded, which limits evaluation for stenoses and for small aneurysms. The intracranial internal carotid arteries are patent. There is an apparent severe stenosis within the right ICA at the level of the skull base, which may be accentuated by artifact (series 1030, image 7). No other significant stenosis is identified within the internal carotid arteries. The M1 middle cerebral arteries are patent without significant stenosis. There is  an attenuated appearance of multiple mid M2 and more distal right MCA branch vessels. Additionally, there is an attenuated appearance of multiple distal M2 and more distal left MCA branch vessels. Findings may be related to motion artifact. High-grade stenoses within these vessels cannot be excluded. The anterior cerebral arteries are patent without significant proximal stenosis. No intracranial aneurysm is identified. The intracranial vertebral arteries are patent bilaterally with the right being slightly dominant. The basilar artery is patent without significant stenosis. The posterior cerebral arteries are patent proximally without significant stenosis. Attenuated appear of the P3 and more distal PCA branches bilaterally which may reflect motion artifact. Stay high-grade stenoses within these vessels cannot be excluded. IMPRESSION: MRI brain: 1. Unchanged size of an acute left thalamic parenchymal hemorrhage. Surrounding edema has also not significantly changed. No significant ventricular effacement. No midline shift. 2. Similar appearance of ventricular extension into the left lateral ventricle. New from prior examination, there is a small amount of hemorrhage within the right occipital horn. No evidence of hydrocephalus at this time. 3. Mild generalized parenchymal atrophy and chronic small vessel  ischemic disease. 4. Mild ethmoid sinus mucosal thickening. 5. Trace bilateral mastoid effusions. MRA head: 1. Mild-to-moderate motion degradation limits evaluation. 2. No intracranial aneurysm is identified. 3. Apparent severe stenosis within the right ICA at the level of the skull base, which may be accentuated by susceptibility artifact at this level. 4. Attenuated appearance of multiple mid M2 and more distal right MCA branch vessels. Additionally, there is an attenuated appearance of multiple distal M2 and more distal left MCA branch vessels. Findings may be related to motion artifact. High-grade stenoses within  these vessels cannot be excluded. 5. Attenuated appearance of the P3 and more distal PCA branches bilaterally. These findings may also reflect motion artifact. High-grade stenoses within these vessels cannot be excluded. Electronically Signed   By: Jackey LogeKyle  Golden DO   On: 05/13/2020 14:45   MR BRAIN WO CONTRAST  Result Date: 05/13/2020 CLINICAL DATA:  Stroke, follow-up. EXAM: MRI HEAD WITHOUT CONTRAST MRA HEAD WITHOUT CONTRAST TECHNIQUE: Multiplanar, multiecho pulse sequences of the brain and surrounding structures were obtained without intravenous contrast. Angiographic images of the head were obtained using MRA technique without contrast. COMPARISON:  Noncontrast head CT performed earlier the same day 05/12/2020 FINDINGS: MRI HEAD FINDINGS Brain: Mild intermittent motion degradation. An acute parenchymal hemorrhage centered within the left thalamus has not significantly changed in size, measuring 1.7 x 1.8 cm in transaxial dimensions. Similar appearance of surrounding edema. There is no significant ventricular effacement. No midline shift. Intraventricular extension into the left lateral ventricle does not appear significantly changed. However, there is a new small amount of hemorrhage within the right occipital horn. No hydrocephalus at this time. Mild patchy T2/FLAIR hyperintensity within the cerebral white matter is nonspecific, but consistent with chronic small vessel ischemic disease. Redemonstrated mild generalized parenchymal atrophy. No evidence of intracranial mass. No extra-axial fluid collection. Vascular: Reported below. Skull and upper cervical spine: No focal marrow lesion. Sinuses/Orbits: Visualized orbits show no acute finding. Mild ethmoid, sphenoid and maxillary sinus mucosal thickening. Trace bilateral mastoid effusions. MRA HEAD FINDINGS The examination is mild to moderately motion degraded, which limits evaluation for stenoses and for small aneurysms. The intracranial internal carotid  arteries are patent. There is an apparent severe stenosis within the right ICA at the level of the skull base, which may be accentuated by artifact (series 1030, image 7). No other significant stenosis is identified within the internal carotid arteries. The M1 middle cerebral arteries are patent without significant stenosis. There is an attenuated appearance of multiple mid M2 and more distal right MCA branch vessels. Additionally, there is an attenuated appearance of multiple distal M2 and more distal left MCA branch vessels. Findings may be related to motion artifact. High-grade stenoses within these vessels cannot be excluded. The anterior cerebral arteries are patent without significant proximal stenosis. No intracranial aneurysm is identified. The intracranial vertebral arteries are patent bilaterally with the right being slightly dominant. The basilar artery is patent without significant stenosis. The posterior cerebral arteries are patent proximally without significant stenosis. Attenuated appear of the P3 and more distal PCA branches bilaterally which may reflect motion artifact. Stay high-grade stenoses within these vessels cannot be excluded. IMPRESSION: MRI brain: 1. Unchanged size of an acute left thalamic parenchymal hemorrhage. Surrounding edema has also not significantly changed. No significant ventricular effacement. No midline shift. 2. Similar appearance of ventricular extension into the left lateral ventricle. New from prior examination, there is a small amount of hemorrhage within the right occipital horn. No evidence of hydrocephalus at this time.  3. Mild generalized parenchymal atrophy and chronic small vessel ischemic disease. 4. Mild ethmoid sinus mucosal thickening. 5. Trace bilateral mastoid effusions. MRA head: 1. Mild-to-moderate motion degradation limits evaluation. 2. No intracranial aneurysm is identified. 3. Apparent severe stenosis within the right ICA at the level of the skull base,  which may be accentuated by susceptibility artifact at this level. 4. Attenuated appearance of multiple mid M2 and more distal right MCA branch vessels. Additionally, there is an attenuated appearance of multiple distal M2 and more distal left MCA branch vessels. Findings may be related to motion artifact. High-grade stenoses within these vessels cannot be excluded. 5. Attenuated appearance of the P3 and more distal PCA branches bilaterally. These findings may also reflect motion artifact. High-grade stenoses within these vessels cannot be excluded. Electronically Signed   By: Jackey Loge DO   On: 05/13/2020 14:45   ECHOCARDIOGRAM COMPLETE  Result Date: 05/12/2020    ECHOCARDIOGRAM REPORT   Patient Name:   KORAN SEABROOK Date of Exam: 05/12/2020 Medical Rec #:  [id number]    Height: Accession #:    [id number]   Weight:       171.3 lb Date of Birth:  1956-01-14    BSA:          1.821 m Patient Age:    64 years     BP:           133/70 mmHg Patient Gender: M            HR:           60 bpm. Exam Location:  Inpatient Procedure: 2D Echo Indications:    stroke 434.91  History:        Patient has no prior history of Echocardiogram examinations.  Sonographer:    Delcie Roch Referring Phys: [phone] ERIC LINDZEN IMPRESSIONS  1. Left ventricular ejection fraction, by estimation, is 60 to 65%. The left ventricle has normal function. The left ventricle has no regional wall motion abnormalities. There is mild concentric left ventricular hypertrophy. Left ventricular diastolic parameters are consistent with Grade I diastolic dysfunction (impaired relaxation).  2. Right ventricular systolic function is normal. The right ventricular size is normal. Tricuspid regurgitation signal is inadequate for assessing PA pressure.  3. The mitral valve is normal in structure. Trivial mitral valve regurgitation. No evidence of mitral stenosis.  4. The aortic valve is normal in structure. Aortic valve regurgitation is not visualized. No aortic  stenosis is present.  5. The inferior vena cava is normal in size with greater than 50% respiratory variability, suggesting right atrial pressure of 3 mmHg. FINDINGS  Left Ventricle: Left ventricular ejection fraction, by estimation, is 60 to 65%. The left ventricle has normal function. The left ventricle has no regional wall motion abnormalities. The left ventricular internal cavity size was normal in size. There is  mild concentric left ventricular hypertrophy. Left ventricular diastolic parameters are consistent with Grade I diastolic dysfunction (impaired relaxation). Normal left ventricular filling pressure. Right Ventricle: The right ventricular size is normal. No increase in right ventricular wall thickness. Right ventricular systolic function is normal. Tricuspid regurgitation signal is inadequate for assessing PA pressure. Left Atrium: Left atrial size was normal in size. Right Atrium: Right atrial size was normal in size. Pericardium: There is no evidence of pericardial effusion. Mitral Valve: The mitral valve is normal in structure. Normal mobility of the mitral valve leaflets. Trivial mitral valve regurgitation. No evidence of mitral valve stenosis. Tricuspid Valve: The tricuspid valve is normal  in structure. Tricuspid valve regurgitation is trivial. No evidence of tricuspid stenosis. Aortic Valve: The aortic valve is normal in structure. Aortic valve regurgitation is not visualized. No aortic stenosis is present. Pulmonic Valve: The pulmonic valve was normal in structure. Pulmonic valve regurgitation is not visualized. No evidence of pulmonic stenosis. Aorta: The aortic root is normal in size and structure. Venous: The inferior vena cava was not well visualized. The inferior vena cava is normal in size with greater than 50% respiratory variability, suggesting right atrial pressure of 3 mmHg. IAS/Shunts: No atrial level shunt detected by color flow Doppler.  LEFT VENTRICLE PLAX 2D LVIDd:         4.20 cm   Diastology LVIDs:         2.80 cm  LV e' lateral:   8.49 cm/s LV PW:         1.30 cm  LV E/e' lateral: 8.1 LV IVS:        1.20 cm  LV e' medial:    6.31 cm/s LVOT diam:     1.90 cm  LV E/e' medial:  10.9 LV SV:         82 LV SV Index:   45 LVOT Area:     2.84 cm  RIGHT VENTRICLE RV S prime:     12.80 cm/s TAPSE (M-mode): 2.3 cm LEFT ATRIUM           Index       RIGHT ATRIUM           Index LA diam:      3.30 cm 1.81 cm/m  RA Area:     15.50 cm LA Vol (A2C): 61.3 ml 33.66 ml/m RA Volume:   36.60 ml  20.10 ml/m  AORTIC VALVE LVOT Vmax:   108.00 cm/s LVOT Vmean:  77.600 cm/s LVOT VTI:    0.289 m  AORTA Ao Root diam: 3.30 cm MITRAL VALVE MV Area (PHT): 3.27 cm    SHUNTS MV Decel Time: 232 msec    Systemic VTI:  0.29 m MV E velocity: 69.00 cm/s  Systemic Diam: 1.90 cm MV A velocity: 98.10 cm/s MV E/A ratio:  0.70 Armanda Magic MD Electronically signed by Armanda Magic MD Signature Date/Time: 05/12/2020/4:28:50 PM    Final    CT HEAD CODE STROKE WO CONTRAST  Result Date: 05/12/2020 CLINICAL DATA:  Code stroke. Right-sided weakness and right facial droop. EXAM: CT HEAD WITHOUT CONTRAST TECHNIQUE: Contiguous axial images were obtained from the base of the skull through the vertex without intravenous contrast. COMPARISON:  None. FINDINGS: Brain: 1.6 by 1.7 x 2.4 cm in diameter intraparenchymal hematoma in the left thalamus with intraventricular penetration. Surrounding edema. No hydrocephalus. Chronic small-vessel ischemic changes affect the pons and cerebral hemispheric white matter elsewhere. Vascular: There is atherosclerotic calcification of the major vessels at the base of the brain. Skull: Negative Sinuses/Orbits: Inflammatory changes of the right division of the sphenoid sinus. Orbits negative. Other: None ASPECTS ([place] Stroke Program Early CT Score) - Ganglionic level infarction (caudate, lentiform nuclei, internal capsule, insula, M1-M3 cortex): 7 - Supraganglionic infarction (M4-M6 cortex): 3 Total score  (0-10 with 10 being normal): 10 IMPRESSION: 1. 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 Intraventricular penetration with blood in the left lateral ventricle. No ventricular obstruction. 2. ASPECTS is 10. 3. These results were communicated to Dr. Otelia Limes at 8:57 amon 6/5/2021by text page via the [facility] messaging  system. Electronically Signed   By: Nelson Chimes M.D.   On: 05/12/2020 08:58      HISTORY OF PRESENT ILLNESS Terry Barton is an 64 y.o. male presenting to [facility] with acute onset of worsened right sided weakness and new onset slurred speech. LKW was last night 05/11/2020 at 2330. Per EMS, he has a history of spinal stenosis with associated right sided weakness at baseline and uses a wheelchair. He lives at home with his brother. In the context of patient being confused, he informed the EDP that at his baseline he can use his right side without any difficulty. His BP was 227/99 in CT.  CT head revealed an acute left thalamic hemorrhage with mild surrounding edema and extension of blood into the left lateral ventricle.   HOSPITAL COURSE Terry Barton is a 64 y.o. male with history of spinal stenosis associated with right sided weakness at baseline causing him to use a wheelchair who presented to [facility] after he developed acute onset of worsened right sided weakness, confusion and new onset slurred speech. BP was 227/99 in CT. LKN was last night at 2330. 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 Resultant increased right arm weakness.  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   Code Stroke CT Head - Acute intraparenchymal hemorrhage in the left thalamus with measurements of 1.6 x 1.7 x 2.4 (volume = 3.4) Cm mild surrounding edema. Intraventricular penetration with blood in the left lateral ventricle. No  ventricular obstruction. ASPECTS is 10.    MRI head -stable thalamic hemorrhage with mild intraventricular extension but no hydrocephalus or midline shift. MRA head -moderate bilateral M2 stenosis CTA H&N - not ordered  2D Echo -normal ejection fraction.  No cardiac source of embolism Terry Barton Virus 2 - negative LDL - 110 HgbA1c - 5.2 No antithrombotic prior to admission, now on No antithrombotic given ICH Therapy recommendations: SNF Disposition:  Return home -  w/c bound, lived w/ son and brother Terry Barton. Disabled since 2015 - they do not desire HH or any additional equipement   Hypertensive Emergency BP as high as 209/94 on arrival Home BP meds: metoprolol Treated with cleviprex-> off Resumed metoprolol 25 bid, added norvasc 5 & HCTZ 25   SBP goal < 160 mm Hg in hospital BP now stable BP goal normotensive   Hyperlipidemia Home Lipid lowering medication: none  LDL 110 Current lipid lowering medication: None (statin contraindicated with ICH) Consider statin at time of follow up with PCP or Neuro   Other Stroke Risk Factors Advanced age   Other Active Problems CKD, stage 3b  Mild anemia. Resume Fe Loose stools on senna. D/c senna.  Hypokalemia, repleted   DISCHARGE EXAM Blood pressure (!) 145/66, pulse 65, temperature 98.3 F (36.8 C), temperature source Oral, resp. rate 18, weight 77.7 kg, SpO2 100 %. Obese middle-aged African-American male not in distress. Afebrile. Head is nontraumatic. Neck is supple without bruit.    Cardiac exam no murmur or gallop. Lungs are clear to auscultation. Distal pulses are well felt. Neurological Exam :  Awake alert oriented x2.  Diminished attention, registration and recall.  Speech is clear without dysarthria but some hesitancy and word finding difficulties.  Extraocular movements appear full range with slight saccadic dysmetria on left lateral gaze.  Blinks to threat on both sides. Right lower facial weakness.  Tongue midline.  Motor  system exam shows right upper extremity strength  2/5 and significant weakness of right grip and intrinsic hand muscles with increased tone in the right arm.  Left upper extremity strength is normal.  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  Plantars both equivocal.  Gait not tested.  Sensation appears normal.  Discharge Diet   Regular diet with thin liquids  DISCHARGE PLAN Disposition:  Return home with family Due to hemorrhage and risk of bleeding, do not take aspirin, aspirin-containing medications, or ibuprofen products  Follow-up PCP in 2 weeks. Follow-up in [facility] in 4 weeks, office to schedule an appointment.   35 minutes were spent preparing discharge.  Annie Main, MSN, APRN, ANVP-BC, AGPCNP-BC Advanced Practice Stroke Nurse [facility] See Amion for Schedule & Pager information 05/17/2020 3:13 PM   I have personally obtained history,examined this patient, reviewed notes, independently viewed imaging studies, participated in medical decision making and plan of care.ROS completed by me personally and pertinent positives fully documented  I have made any additions or clarifications directly to the above note. Agree with note above.   Delia Heady, MD Medical Director [facility] Pager: [phone] 05/17/2020 4:12 PM

## 2020-05-16 NOTE — Progress Notes (Signed)
STROKE TEAM PROGRESS NOTE   INTERVAL HISTORY Patient is sitting up in bed.  He is doing well.  He has no complaints.  Vital signs are stable.  Blood pressure adequately controlled.  OBJECTIVE Vitals:   05/15/20 2354 05/16/20 0430 05/16/20 0800 05/16/20 1116  BP: 122/60 (!) 142/68 (!) 150/61 (!) 145/66  Pulse: 69 63 65   Resp: 18 18 18 18   Temp: 99.3 F (37.4 C) 98.4 F (36.9 C) 97.7 F (36.5 C) 98.3 F (36.8 C)  TempSrc: Oral Axillary Oral Oral  SpO2: 100% 100% 100% 100%  Weight:        CBC:  Recent Labs  Lab 05/12/20 0839 05/12/20 0839 05/12/20 0844 05/16/20 0414  WBC 7.9  --   --  14.4*  NEUTROABS 5.0  --   --   --   HGB 11.4*   < > 12.6* 10.0*  HCT 36.3*   < > 37.0* 31.1*  MCV 100.8*  --   --  97.8  PLT 266  --   --  240   < > = values in this interval not displayed.    Basic Metabolic Panel:  Recent Labs  Lab 05/12/20 0839 05/12/20 0839 05/12/20 0844 05/16/20 0414  NA 139   < > 141 133*  K 3.7   < > 3.7 3.2*  CL 103   < > 103 102  CO2 27  --   --  22  GLUCOSE 136*   < > 131* 99  BUN 16   < > 19 25*  CREATININE 1.76*   < > 1.90* 2.14*  CALCIUM 8.8*  --   --  8.1*   < > = values in this interval not displayed.    IMAGING No results found.   PHYSICAL EXAM     Blood pressure (!) 145/66, pulse 65, temperature 98.3 F (36.8 C), temperature source Oral, resp. rate 18, weight 77.7 kg, SpO2 100 %. Obese middle-aged African-American male not in distress . Marland Kitchen Afebrile. Head is nontraumatic. Neck is supple without bruit.    Cardiac exam no murmur or gallop. Lungs are clear to auscultation. Distal pulses are well felt. Neurological Exam :  Awake alert oriented x2.  Diminished attention, registration and recall.  Speech is clear without dysarthria but some hesitancy and word finding difficulties.  Extraocular movements appear full range with slight saccadic dysmetria on left lateral gaze.  Blinks to threat on both sides.  Right lower facial weakness.  Tongue  midline.  Motor system exam shows right upper extremity strength 2/5 and significant weakness of right grip and intrinsic hand muscles with increased tone in the right arm.  Left upper extremity strength is normal.  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  Plantars both equivocal.  Gait not tested.  Sensation appears normal.   ASSESSMENT/PLAN [name] is a 64 y.o. male with history of spinal stenosis associated with right sided weakness at baseline causing him to use a wheelchair who presented to [facility] after he developed acute onset of worsened right sided weakness, confusion and new onset slurred speech. BP was 227/99 in CT. LKN was last night at 2330. 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 He did not receive IV t-PA due to [place].   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  Resultant increased right arm weakness.  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    Code Stroke  CT Head - Acute intraparenchymal hemorrhage in the left thalamus with measurements of 1.6 x 1.7 x 2.4 (volume = 3.4) Cm mild surrounding edema. Intraventricular penetration with blood in the left lateral ventricle. No ventricular obstruction. ASPECTS is 10.     MRI head -stable thalamic hemorrhage with mild intraventricular extension but no hydrocephalus or midline shift.  MRA head -moderate bilateral M2 stenosis CTA H&N - not ordered   2D Echo -normal ejection fraction.  No cardiac source of embolism Loyal Jacobson Virus 2 - negative  LDL - 110  HgbA1c - 5.2  VTE prophylaxis - Lovenox 40 mg sq daily   No antithrombotic prior to admission, now on No antithrombotic ICH  Therapy recommendations: SNF  Disposition:  Return home - plans for tomorrow -  w/c bound, lived w/ son and brother [name]. Disabled since 2015 - they do not desire HH or any additional  equipement  Hypertension  Home BP meds: metoprolol  Treated with cleviprex-> off  Current BP meds: metoprolol 25 bid, norvasc 5, HCTZ 25    Stable  . SBP goal < 160 mm Hg  . Long-term BP goal normotensive  Hyperlipidemia  Home Lipid lowering medication: none   LDL 110  Current lipid lowering medication: None (statin contraindicated with ICH)  Consider statin at discharge  Other Stroke Risk Factors  Advanced age  Other Active Problems CKD - stage 3b - creatinine - 1.76->1.90->2.14  Mild anemia. Resume Fe  Loose stools on senna. D/c senna.   Hypokalemia, replete K  Hospital day # 4  Continue ongoing therapies.  Transfer to skilled nursing facility for rehab when bed available hopefully in the next few days.  Medically stable for transfer.  Delia Heady, MD Medical Director [facility] Stroke [facility] Pager: [phone] 05/16/2020 3:52 PM  To contact Stroke Continuity provider, please refer to [email or web address]. After hours, contact General Neurology

## 2020-05-16 NOTE — Progress Notes (Signed)
Physical Therapy Treatment Patient Details Name: Terry Barton MRN: [id number] DOB: 1956-02-02 Today's Date: 05/16/2020    History of Present Illness 64 y.o. male presenting to [facility] with acute onset of worsened right sided weakness and new onset slurred speech. LKN was last night at 2330. Per EMS, he has a history of spinal stenosis with associated right sided weakness at baseline and uses a wheelchair. 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    PT Comments    Patient seen for mobility progression. This session focused on bed mobility, bilat UE/LE ROM, and OOB transfer to recliner. Pt requires total A +2 for bed mobility. Hoyer lift utilized for [employer] transfer and pt tolerated well.   PT will continue to follow acutely and progress as tolerated.    Follow Up Recommendations  SNF(HHPT if family elects to D/C home)     Equipment Recommendations  Hospital bed    Recommendations for Other Services       Precautions / Restrictions Precautions Precautions: Fall Restrictions Weight Bearing Restrictions: No    Mobility  Bed Mobility Overal bed mobility: Needs Assistance Bed Mobility: Rolling Rolling: +2 for physical assistance;Total assist         General bed mobility comments: multimodal cues for all sequencing; pt able to assist minimally with L UE to maintain side lying  Transfers                    Ambulation/Gait                 Stairs             Wheelchair Mobility    Modified Rankin (Stroke Patients Only) Modified Rankin (Stroke Patients Only) Pre-Morbid Rankin Score: Severe disability Modified Rankin: Severe disability     Balance Overall balance assessment: (unable to assess)                                          Cognition Arousal/Alertness: Awake/alert Behavior During Therapy: WFL for tasks assessed/performed Overall Cognitive Status: Impaired/Different from  baseline Area of Impairment: Memory;Problem solving                     Memory: Decreased short-term memory       Problem Solving: Slow processing;Difficulty sequencing;Requires verbal cues;Requires tactile cues        Exercises Other Exercises Other Exercises: bilat LE AAROM    General Comments        Pertinent Vitals/Pain Pain Assessment: Faces Faces Pain Scale: Hurts little more Pain Location: generalized with ROM of extremities (> with R elbow extension) Pain Descriptors / Indicators: Grimacing Pain Intervention(s): Limited activity within patient's tolerance;Monitored during session;Repositioned    Home Living                      Prior Function            PT Goals (current goals can now be found in the care plan section) Progress towards PT goals: Progressing toward goals    Frequency    Min 3X/week      PT Plan Current plan remains appropriate    Co-evaluation PT/OT/SLP Co-Evaluation/Treatment: Yes Reason for Co-Treatment: Complexity of the patient's impairments (multi-system involvement);For patient/therapist safety PT goals addressed during session: Mobility/safety with mobility;Strengthening/ROM        AM-PAC  PT "6 Clicks" Mobility   Outcome Measure  Help needed turning from your back to your side while in a flat bed without using bedrails?: Total Help needed moving from lying on your back to sitting on the side of a flat bed without using bedrails?: Total Help needed moving to and from a bed to a chair (including a wheelchair)?: Total Help needed standing up from a chair using your arms (e.g., wheelchair or bedside chair)?: Total Help needed to walk in hospital room?: Total Help needed climbing 3-5 steps with a railing? : Total 6 Click Score: 6    End of Session   Activity Tolerance: Patient tolerated treatment well Patient left: with call bell/phone within reach;in chair;with chair alarm set Nurse Communication:  Mobility status;Need for lift equipment PT Visit Diagnosis: Muscle weakness (generalized) (M62.81);Other abnormalities of gait and mobility (R26.89);Other symptoms and signs involving the nervous system (R29.898)     Time: 1791-5056 PT Time Calculation (min) (ACUTE ONLY): 45 min  Charges:  $Therapeutic Activity: 8-22 mins                     Earney Navy, PTA Acute Rehabilitation Services Pager: [phone] Office: [phone]     Darliss Cheney 05/16/2020, 1:45 PM

## 2020-05-17 DIAGNOSIS — N1831 Chronic kidney disease, stage 3a: Secondary | ICD-10-CM | POA: Diagnosis present

## 2020-05-17 DIAGNOSIS — N183 Chronic kidney disease, stage 3 unspecified: Secondary | ICD-10-CM | POA: Diagnosis present

## 2020-05-17 DIAGNOSIS — I10 Essential (primary) hypertension: Secondary | ICD-10-CM | POA: Diagnosis present

## 2020-05-17 DIAGNOSIS — E876 Hypokalemia: Secondary | ICD-10-CM | POA: Diagnosis present

## 2020-05-17 DIAGNOSIS — E785 Hyperlipidemia, unspecified: Secondary | ICD-10-CM | POA: Diagnosis present

## 2020-05-17 MED ORDER — AMLODIPINE BESYLATE 5 MG PO TABS: 5.0000 mg | ORAL_TABLET | Freq: Every day | ORAL | 2 refills | Status: DC

## 2020-05-17 MED ORDER — HYDROCHLOROTHIAZIDE 25 MG PO TABS: 25.0000 mg | ORAL_TABLET | Freq: Every day | ORAL | 2 refills | Status: DC

## 2020-05-17 NOTE — TOC Transition Note (Signed)
Transition of Care [facility]) - CM/SW Discharge Note   Patient Details  Name: Terry Barton MRN: [id number] Date of Birth: Feb 01, 1956  [facility] CM/SW Contact:  Kermit Balo, RN Phone Number: 05/17/2020, 3:33 PM   Clinical Narrative:    Pt discharging home with self care and family care. Pt lives with sons and they provided the needed supervision and care he needs. Son: Rose Phi states they have all needed DME at the home.  PCP: [facility] [name] requested PTAR home. CM has arranged transportation. D/c packet at the desk and bedside RN updated.    Final next level of care: Home/Self Care Barriers to Discharge: No Barriers Identified   Patient Goals and CMS Choice        Discharge Placement                       Discharge Plan and Services   Discharge Planning Services: CM Consult                                 Social Determinants of Health (SDOH) Interventions     Readmission Risk Interventions No flowsheet data found.

## 2020-05-17 NOTE — Progress Notes (Signed)
Pt discharged with PTAR with all paperwork and belongings.

## 2020-05-27 ENCOUNTER — Inpatient Hospital Stay (HOSPITAL_COMMUNITY)
Admit: 2020-05-27 | Discharge: 2020-06-01 | DRG: 682 | Disposition: A | Discharge status: Home / Self Care | Payer: Medicare Other | Attending: Internal Medicine | Admitting: Internal Medicine

## 2020-05-27 ENCOUNTER — Emergency Department (HOSPITAL_COMMUNITY): Payer: Medicare Other

## 2020-05-27 ENCOUNTER — Encounter (HOSPITAL_COMMUNITY): Payer: Self-pay | Admitting: *Deleted

## 2020-05-27 ENCOUNTER — Other Ambulatory Visit: Payer: Self-pay

## 2020-05-27 DIAGNOSIS — I129 Hypertensive chronic kidney disease with stage 1 through stage 4 chronic kidney disease, or unspecified chronic kidney disease: Secondary | ICD-10-CM | POA: Diagnosis present

## 2020-05-27 DIAGNOSIS — G9340 Encephalopathy, unspecified: Secondary | ICD-10-CM | POA: Diagnosis not present

## 2020-05-27 DIAGNOSIS — G8220 Paraplegia, unspecified: Secondary | ICD-10-CM | POA: Diagnosis present

## 2020-05-27 DIAGNOSIS — N390 Urinary tract infection, site not specified: Secondary | ICD-10-CM | POA: Diagnosis present

## 2020-05-27 DIAGNOSIS — Z66 Do not resuscitate: Secondary | ICD-10-CM | POA: Diagnosis not present

## 2020-05-27 DIAGNOSIS — R531 Weakness: Secondary | ICD-10-CM

## 2020-05-27 DIAGNOSIS — Z7401 Bed confinement status: Secondary | ICD-10-CM

## 2020-05-27 DIAGNOSIS — Z23 Encounter for immunization: Secondary | ICD-10-CM | POA: Diagnosis present

## 2020-05-27 DIAGNOSIS — I10 Essential (primary) hypertension: Secondary | ICD-10-CM | POA: Diagnosis present

## 2020-05-27 DIAGNOSIS — N183 Chronic kidney disease, stage 3 unspecified: Secondary | ICD-10-CM | POA: Diagnosis present

## 2020-05-27 DIAGNOSIS — D631 Anemia in chronic kidney disease: Secondary | ICD-10-CM | POA: Diagnosis present

## 2020-05-27 DIAGNOSIS — N1831 Chronic kidney disease, stage 3a: Secondary | ICD-10-CM | POA: Diagnosis present

## 2020-05-27 DIAGNOSIS — G936 Cerebral edema: Secondary | ICD-10-CM | POA: Diagnosis present

## 2020-05-27 DIAGNOSIS — T502X5A Adverse effect of carbonic-anhydrase inhibitors, benzothiadiazides and other diuretics, initial encounter: Secondary | ICD-10-CM | POA: Diagnosis not present

## 2020-05-27 DIAGNOSIS — B9620 Unspecified Escherichia coli [E. coli] as the cause of diseases classified elsewhere: Secondary | ICD-10-CM | POA: Diagnosis present

## 2020-05-27 DIAGNOSIS — E860 Dehydration: Secondary | ICD-10-CM | POA: Diagnosis present

## 2020-05-27 DIAGNOSIS — R1310 Dysphagia, unspecified: Secondary | ICD-10-CM | POA: Diagnosis present

## 2020-05-27 DIAGNOSIS — N1832 Chronic kidney disease, stage 3b: Secondary | ICD-10-CM | POA: Diagnosis not present

## 2020-05-27 DIAGNOSIS — Z20822 Contact with and (suspected) exposure to covid-19: Secondary | ICD-10-CM | POA: Diagnosis present

## 2020-05-27 DIAGNOSIS — G92 Toxic encephalopathy: Secondary | ICD-10-CM | POA: Diagnosis present

## 2020-05-27 DIAGNOSIS — N179 Acute kidney failure, unspecified: Secondary | ICD-10-CM | POA: Diagnosis present

## 2020-05-27 DIAGNOSIS — Z79899 Other long term (current) drug therapy: Secondary | ICD-10-CM | POA: Diagnosis not present

## 2020-05-27 DIAGNOSIS — E876 Hypokalemia: Secondary | ICD-10-CM | POA: Diagnosis present

## 2020-05-27 DIAGNOSIS — I619 Nontraumatic intracerebral hemorrhage, unspecified: Secondary | ICD-10-CM | POA: Diagnosis present

## 2020-05-27 DIAGNOSIS — I610 Nontraumatic intracerebral hemorrhage in hemisphere, subcortical: Secondary | ICD-10-CM | POA: Diagnosis not present

## 2020-05-27 HISTORY — DX: Essential (primary) hypertension: I10

## 2020-05-27 HISTORY — DX: Nontraumatic intracranial hemorrhage, unspecified: I62.9

## 2020-05-27 LAB — CBC WITH DIFFERENTIAL/PLATELET
Abs Immature Granulocytes: 0.04 10*3/uL (ref 0.00–0.07)
Basophils Absolute: 0 10*3/uL (ref 0.0–0.1)
Basophils Relative: 0 %
Eosinophils Absolute: 0.1 10*3/uL (ref 0.0–0.5)
Eosinophils Relative: 1 %
HCT: 38.7 % — ABNORMAL LOW (ref 39.0–52.0)
Hemoglobin: 12.3 g/dL — ABNORMAL LOW (ref 13.0–17.0)
Immature Granulocytes: 1 %
Lymphocytes Relative: 21 %
Lymphs Abs: 1.8 10*3/uL (ref 0.7–4.0)
MCH: 31.1 pg (ref 26.0–34.0)
MCHC: 31.8 g/dL (ref 30.0–36.0)
MCV: 98 fL (ref 80.0–100.0)
Monocytes Absolute: 0.8 10*3/uL (ref 0.1–1.0)
Monocytes Relative: 10 %
Neutro Abs: 5.7 10*3/uL (ref 1.7–7.7)
Neutrophils Relative %: 67 %
Platelets: 390 10*3/uL (ref 150–400)
RBC: 3.95 MIL/uL — ABNORMAL LOW (ref 4.22–5.81)
RDW: 12.8 % (ref 11.5–15.5)
WBC: 8.5 10*3/uL (ref 4.0–10.5)
nRBC: 0 % (ref 0.0–0.2)

## 2020-05-27 LAB — I-STAT ARTERIAL BLOOD GAS, ED
Acid-Base Excess: 0 mmol/L (ref 0.0–2.0)
Bicarbonate: 24 mmol/L (ref 20.0–28.0)
Calcium, Ion: 1.2 mmol/L (ref 1.15–1.40)
HCT: 35 % — ABNORMAL LOW (ref 39.0–52.0)
Hemoglobin: 11.9 g/dL — ABNORMAL LOW (ref 13.0–17.0)
O2 Saturation: 99 %
Patient temperature: 98.7
Potassium: 3.1 mmol/L — ABNORMAL LOW (ref 3.5–5.1)
Sodium: 136 mmol/L (ref 135–145)
TCO2: 25 mmol/L (ref 22–32)
pCO2 arterial: 36.4 mmHg (ref 32.0–48.0)
pH, Arterial: 7.427 (ref 7.350–7.450)
pO2, Arterial: 132 mmHg — ABNORMAL HIGH (ref 83.0–108.0)

## 2020-05-27 LAB — COMPREHENSIVE METABOLIC PANEL
ALT: 47 U/L — ABNORMAL HIGH (ref 0–44)
AST: 23 U/L (ref 15–41)
Albumin: 3.1 g/dL — ABNORMAL LOW (ref 3.5–5.0)
Alkaline Phosphatase: 143 U/L — ABNORMAL HIGH (ref 38–126)
Anion gap: 10 (ref 5–15)
BUN: 29 mg/dL — ABNORMAL HIGH (ref 8–23)
CO2: 25 mmol/L (ref 22–32)
Calcium: 9.3 mg/dL (ref 8.9–10.3)
Chloride: 99 mmol/L (ref 98–111)
Creatinine, Ser: 2.86 mg/dL — ABNORMAL HIGH (ref 0.61–1.24)
GFR calc Af Amer: 25 mL/min — ABNORMAL LOW (ref >60)
GFR calc non Af Amer: 22 mL/min — ABNORMAL LOW (ref >60)
Glucose, Bld: 159 mg/dL — ABNORMAL HIGH (ref 70–99)
Potassium: 3.3 mmol/L — ABNORMAL LOW (ref 3.5–5.1)
Sodium: 134 mmol/L — ABNORMAL LOW (ref 135–145)
Total Bilirubin: 0.9 mg/dL (ref 0.3–1.2)
Total Protein: 8.1 g/dL (ref 6.5–8.1)

## 2020-05-27 LAB — TYPE AND SCREEN
ABO/RH(D): O POS
Antibody Screen: NEGATIVE

## 2020-05-27 LAB — PROTIME-INR
INR: 1.1 (ref 0.8–1.2)
Prothrombin Time: 13.8 seconds (ref 11.4–15.2)

## 2020-05-27 LAB — TROPONIN I (HIGH SENSITIVITY): Troponin I (High Sensitivity): 5 ng/L (ref <18)

## 2020-05-27 LAB — CBG MONITORING, ED: Glucose-Capillary: 147 mg/dL — ABNORMAL HIGH (ref 70–99)

## 2020-05-27 LAB — LACTIC ACID, PLASMA: Lactic Acid, Venous: 1.1 mmol/L (ref 0.5–1.9)

## 2020-05-27 LAB — SARS CORONAVIRUS 2 BY RT PCR (HOSPITAL ORDER, PERFORMED IN ~~LOC~~ HOSPITAL LAB): SARS Coronavirus 2: NEGATIVE

## 2020-05-27 MED ORDER — SODIUM CHLORIDE 0.9 % IV BOLUS: 500.0000 mL | Freq: Once | INTRAVENOUS | Status: DC

## 2020-05-27 MED ORDER — SODIUM CHLORIDE 0.9 % IV SOLN: Freq: Once | INTRAVENOUS | Status: AC

## 2020-05-27 NOTE — ED Notes (Signed)
Unable to obtain blood, respiratory called for ABG per MD.

## 2020-05-27 NOTE — ED Notes (Signed)
Unable to draw blod from 22g IV in RT hand

## 2020-05-27 NOTE — ED Provider Notes (Signed)
[facility] Provider Note   CSN: [id number] Arrival date & time: 05/27/20  1523     History Chief Complaint  Patient presents with  . Altered Mental Status    Terry Barton is a 65 y.o. male.  64 year old male with prior medical history as detailed below presents for evaluation of altered mental status.  Patient with recent diagnosis of intracranial hemorrhage.  Patient was discharged on June ninth.  Patient's son Lorrene Reid is the primary caregiver.  He provides history via a phone contact.  Patient has  apparently had a gradual decline since discharge.  Patient with decreased p.o. intake.  Patient with gradually worsening confusion.  Patient has not had a fever per report.  The patient upon my assessment is a poor historian.  Level 5 caveat secondary to same.  He is alert to person only.  The patient denies pain, shortness of breath, nausea, vomiting, or other specific acute complaint.  The history is provided by the patient, medical records and a relative.  Altered Mental Status Presenting symptoms: confusion and disorientation   Severity:  Moderate Most recent episode:  More than 2 days ago Episode history:  Continuous Timing:  Constant Progression:  Worsening Chronicity:  New      History reviewed. No pertinent past medical history.  Patient Active Problem List   Diagnosis Date Noted  . Essential hypertension 05/17/2020  . Hyperlipidemia 05/17/2020  . CKD (chronic kidney disease), stage III 05/17/2020  . Hypokalemia 05/17/2020  . ICH (intracerebral hemorrhage) ([place]) d/t HTN 05/12/2020    History reviewed. No pertinent surgical history.     History reviewed. No pertinent family history.  Social History   Tobacco Use  . Smoking status: Never Smoker  . Smokeless tobacco: Never Used  Vaping Use  . Vaping Use: Never used  Substance Use Topics  . Alcohol use: Not Currently  . Drug use: Not Currently    Home  Medications Prior to Admission medications   Medication Sig Start Date End Date Taking? Authorizing Provider  amLODipine (NORVASC) 5 MG tablet Take 1 tablet (5 mg total) by mouth daily. 05/18/20   Donzetta Starch, NP  Ascorbic Acid (VITAMIN C) 500 MG CAPS Take 1 capsule by mouth daily.    [provider]  cyproheptadine (PERIACTIN) 4 MG tablet Take by mouth 3 (three) times daily.    [provider]  ferrous sulfate 325 (65 FE) MG EC tablet Take 325 mg by mouth 3 (three) times daily with meals.    [provider]  hydrochlorothiazide (HYDRODIURIL) 25 MG tablet Take 1 tablet (25 mg total) by mouth daily. 05/18/20   Donzetta Starch, NP  metoprolol tartrate (LOPRESSOR) 25 MG tablet Take by mouth 2 (two) times daily.    [provider]  traZODone (DESYREL) 50 MG tablet Take 50 mg by mouth at bedtime as needed for sleep.    [provider]    Allergies    Patient has no known allergies.  Review of Systems   Review of Systems  Psychiatric/Behavioral: Positive for confusion.  All other systems reviewed and are negative.   Physical Exam Updated Vital Signs BP 125/61 (BP Location: Left Arm)   Pulse 60   Temp 98.7 F (37.1 C) (Oral)   Resp 19   Wt 77.7 kg   SpO2 100%   Physical Exam Vitals and nursing note reviewed.  Constitutional:      General: He is not in acute distress.  Appearance: Normal appearance. He is well-developed.  HENT:     Head: Normocephalic and atraumatic.  Eyes:     Conjunctiva/sclera: Conjunctivae normal.     Pupils: Pupils are equal, round, and reactive to light.  Cardiovascular:     Rate and Rhythm: Normal rate and regular rhythm.     Heart sounds: Normal heart sounds.  Pulmonary:     Effort: Pulmonary effort is normal. No respiratory distress.     Breath sounds: Normal breath sounds.  Abdominal:     General: There is no distension.     Palpations: Abdomen is soft.     Tenderness: There is no abdominal tenderness.   Musculoskeletal:        General: No deformity. Normal range of motion.     Cervical back: Normal range of motion and neck supple.  Skin:    General: Skin is warm and dry.  Neurological:     General: No focal deficit present.     Mental Status: He is alert. Mental status is at baseline.     Comments: Alert, oriented to person     ED Results / Procedures / Treatments   Labs (all labs ordered are listed, but only abnormal results are displayed) Labs Reviewed  CBG MONITORING, ED - Abnormal; Notable for the following components:      Result Value   Glucose-Capillary 147 (*)    All other components within normal limits  I-STAT ARTERIAL BLOOD GAS, ED - Abnormal; Notable for the following components:   pO2, Arterial 132 (*)    Potassium 3.1 (*)    HCT 35.0 (*)    Hemoglobin 11.9 (*)    All other components within normal limits  SARS CORONAVIRUS 2 BY RT PCR (HOSPITAL ORDER, PERFORMED IN [facility])  CULTURE, BLOOD (ROUTINE X 2)  CULTURE, BLOOD (ROUTINE X 2)  PROTIME-INR  LACTIC ACID, PLASMA  LACTIC ACID, PLASMA  CBC WITH DIFFERENTIAL/PLATELET  URINALYSIS, ROUTINE W REFLEX MICROSCOPIC  CBC WITH DIFFERENTIAL/PLATELET  COMPREHENSIVE METABOLIC PANEL  TYPE AND SCREEN  ABO/RH  TROPONIN I (HIGH SENSITIVITY)    EKG EKG Interpretation  Date/Time:  Sunday May 27 2020 15:37:01 EDT Ventricular Rate:  61 PR Interval:    QRS Duration: 103 QT Interval:  456 QTC Calculation: 460 R Axis:   41 Text Interpretation: Sinus rhythm Minimal ST elevation, anterior leads Confirmed by Kristine Royal [id number]) on 05/27/2020 3:38:17 PM   Radiology CT Head Wo Contrast  Result Date: 05/27/2020 CLINICAL DATA:  Encephalopathy.  Follow-up left thalamic hemorrhage. EXAM: CT HEAD WITHOUT CONTRAST TECHNIQUE: Contiguous axial images were obtained from the base of the skull through the vertex without intravenous contrast. COMPARISON:  05/12/2020 FINDINGS: Brain: Examination demonstrates  interval decrease in size of patient's acute focal hemorrhage over the region of the left thalamus now measuring 1 x 1 x 1.6 cm (previously 1.6 x 1.7 x 2.4 cm). Persistent mild adjacent edema. Interval improvement with tiny amount of residual intraventricular hemorrhage over the dependent portion of the occipital horns. No new areas of hemorrhage. Ventricles, cisterns and other CSF spaces otherwise unchanged. No evidence of mass or mass effect. No midline shift. Evidence of chronic ischemic microvascular disease. Vascular: No hyperdense vessel or unexpected calcification. Skull: Normal. Negative for fracture or focal lesion. Sinuses/Orbits: Orbits are normal. Paranasal sinuses are well developed with small air-fluid levels over the sphenoid sinuses. Other: None. IMPRESSION: 1. Interval decrease in size of patient's acute hemorrhage over the left thalamus now measuring 1 x 1  x 1.6 cm (previously 1.6 x 1.7 x 2.4 cm). Stable mild adjacent edema. Interval improvement and associated intraventricular hemorrhage with only minimal residual hemorrhage over the occipital horns. No new areas of hemorrhage. 2.  Mild chronic ischemic microvascular disease. Electronically Signed   By: Elberta Fortis M.D.   On: 05/27/2020 16:37   DG Chest Port 1 View  Result Date: 05/27/2020 CLINICAL DATA:  Altered mental status.  Weakness. EXAM: PORTABLE CHEST 1 VIEW COMPARISON:  05/12/2020 FINDINGS: Lungs are adequately inflated without focal airspace consolidation, effusion or pneumothorax. Interval resolution of the previously seen moderate right effusion. Cardiomediastinal silhouette and remainder of the exam is unchanged. IMPRESSION: No acute cardiopulmonary disease. Electronically Signed   By: Elberta Fortis M.D.   On: 05/27/2020 16:30    Procedures Procedures (including critical care time)  Medications Ordered in ED Medications - No data to display  ED Course  I have reviewed the triage vital signs and the nursing  notes.  Pertinent labs & imaging results that were available during my care of the patient were reviewed by me and considered in my medical decision making (see chart for details).    MDM Rules/Calculators/A&P                          MDM  Screen complete  Kreed Kauffman was evaluated in Emergency Department on 05/27/2020 for the symptoms described in the history of present illness. He was evaluated in the context of the global COVID-19 pandemic, which necessitated consideration that the patient might be at risk for infection with the SARS-CoV-2 virus that causes COVID-19. Institutional protocols and algorithms that pertain to the evaluation of patients at risk for COVID-19 are in a state of rapid change based on information released by regulatory bodies including the CDC and federal and state organizations. These policies and algorithms were followed during the patient's care in the ED.  Patient is presenting for evaluation of decreased functionality.  Patient's son provides the majority of history.  He reports that the patient has not been quite as active since his discharge after recent admission for treatment of ICH.  The son reports that the patient seemed to be slightly more confused today. He also is reporting decreased PO intake.  He was brought to the ED for evaluation of same.  Initial CT imaging did not reveal worsening of his previously diagnosed ICH.  Nursing reports that the patient was a difficult blood draw and IV stick.  After multiple hours labs have been obtained.  Dehydration with AKI is suggested. Urine is still pending.  Hospitalist service is aware of case and will evaluate for admission.        Final Clinical Impression(s) / ED Diagnoses Final diagnoses:  Weakness  Dehydration    Rx / DC Orders ED Discharge Orders    None       Wynetta Fines, MD 05/31/20 1114

## 2020-05-27 NOTE — ED Triage Notes (Signed)
Family called EMS because Pt was altered today. Pt had a stroke 05-12-20 and RT sided def. EMS reported Family using a hoyer lift in home.  A/o x1

## 2020-05-28 ENCOUNTER — Encounter (HOSPITAL_COMMUNITY): Payer: Self-pay | Admitting: Internal Medicine

## 2020-05-28 DIAGNOSIS — N179 Acute kidney failure, unspecified: Principal | ICD-10-CM

## 2020-05-28 DIAGNOSIS — I610 Nontraumatic intracerebral hemorrhage in hemisphere, subcortical: Secondary | ICD-10-CM

## 2020-05-28 DIAGNOSIS — I10 Essential (primary) hypertension: Secondary | ICD-10-CM

## 2020-05-28 DIAGNOSIS — N1832 Chronic kidney disease, stage 3b: Secondary | ICD-10-CM

## 2020-05-28 DIAGNOSIS — G9340 Encephalopathy, unspecified: Secondary | ICD-10-CM | POA: Diagnosis present

## 2020-05-28 LAB — CBC WITH DIFFERENTIAL/PLATELET
Abs Immature Granulocytes: 0.09 10*3/uL — ABNORMAL HIGH (ref 0.00–0.07)
Basophils Absolute: 0.1 10*3/uL (ref 0.0–0.1)
Basophils Relative: 0 %
Eosinophils Absolute: 0.1 10*3/uL (ref 0.0–0.5)
Eosinophils Relative: 1 %
HCT: 40.2 % (ref 39.0–52.0)
Hemoglobin: 12.6 g/dL — ABNORMAL LOW (ref 13.0–17.0)
Immature Granulocytes: 1 %
Lymphocytes Relative: 16 %
Lymphs Abs: 2 10*3/uL (ref 0.7–4.0)
MCH: 30.8 pg (ref 26.0–34.0)
MCHC: 31.3 g/dL (ref 30.0–36.0)
MCV: 98.3 fL (ref 80.0–100.0)
Monocytes Absolute: 2.1 10*3/uL — ABNORMAL HIGH (ref 0.1–1.0)
Monocytes Relative: 16 %
Neutro Abs: 8.2 10*3/uL — ABNORMAL HIGH (ref 1.7–7.7)
Neutrophils Relative %: 66 %
Platelets: 413 10*3/uL — ABNORMAL HIGH (ref 150–400)
RBC: 4.09 MIL/uL — ABNORMAL LOW (ref 4.22–5.81)
RDW: 12.9 % (ref 11.5–15.5)
WBC: 12.5 10*3/uL — ABNORMAL HIGH (ref 4.0–10.5)
nRBC: 0 % (ref 0.0–0.2)

## 2020-05-28 LAB — CBG MONITORING, ED
Glucose-Capillary: 112 mg/dL — ABNORMAL HIGH (ref 70–99)
Glucose-Capillary: 151 mg/dL — ABNORMAL HIGH (ref 70–99)

## 2020-05-28 LAB — COMPREHENSIVE METABOLIC PANEL
ALT: 39 U/L (ref 0–44)
AST: 18 U/L (ref 15–41)
Albumin: 3.2 g/dL — ABNORMAL LOW (ref 3.5–5.0)
Alkaline Phosphatase: 133 U/L — ABNORMAL HIGH (ref 38–126)
Anion gap: 13 (ref 5–15)
BUN: 32 mg/dL — ABNORMAL HIGH (ref 8–23)
CO2: 21 mmol/L — ABNORMAL LOW (ref 22–32)
Calcium: 9.3 mg/dL (ref 8.9–10.3)
Chloride: 103 mmol/L (ref 98–111)
Creatinine, Ser: 2.64 mg/dL — ABNORMAL HIGH (ref 0.61–1.24)
GFR calc Af Amer: 28 mL/min — ABNORMAL LOW (ref >60)
GFR calc non Af Amer: 24 mL/min — ABNORMAL LOW (ref >60)
Glucose, Bld: 121 mg/dL — ABNORMAL HIGH (ref 70–99)
Potassium: 3.8 mmol/L (ref 3.5–5.1)
Sodium: 137 mmol/L (ref 135–145)
Total Bilirubin: 0.7 mg/dL (ref 0.3–1.2)
Total Protein: 8.5 g/dL — ABNORMAL HIGH (ref 6.5–8.1)

## 2020-05-28 LAB — FOLATE: Folate: 19.9 ng/mL (ref >5.9)

## 2020-05-28 LAB — URINALYSIS, ROUTINE W REFLEX MICROSCOPIC
Bilirubin Urine: NEGATIVE
Glucose, UA: NEGATIVE mg/dL
Ketones, ur: NEGATIVE mg/dL
Nitrite: NEGATIVE
Protein, ur: >=300 mg/dL — AB
Specific Gravity, Urine: 1.013 (ref 1.005–1.030)
WBC, UA: >50 WBC/hpf — ABNORMAL HIGH (ref 0–5)
pH: 5 (ref 5.0–8.0)

## 2020-05-28 LAB — RETICULOCYTES
Immature Retic Fract: 8.7 % (ref 2.3–15.9)
RBC.: 4.08 MIL/uL — ABNORMAL LOW (ref 4.22–5.81)
Retic Count, Absolute: 56.7 10*3/uL (ref 19.0–186.0)
Retic Ct Pct: 1.4 % (ref 0.4–3.1)

## 2020-05-28 LAB — TROPONIN I (HIGH SENSITIVITY): Troponin I (High Sensitivity): 7 ng/L (ref <18)

## 2020-05-28 LAB — HEPATIC FUNCTION PANEL
ALT: 39 U/L (ref 0–44)
AST: 20 U/L (ref 15–41)
Albumin: 3.1 g/dL — ABNORMAL LOW (ref 3.5–5.0)
Alkaline Phosphatase: 132 U/L — ABNORMAL HIGH (ref 38–126)
Bilirubin, Direct: 0.2 mg/dL (ref 0.0–0.2)
Indirect Bilirubin: 0.4 mg/dL (ref 0.3–0.9)
Total Bilirubin: 0.6 mg/dL (ref 0.3–1.2)
Total Protein: 8.1 g/dL (ref 6.5–8.1)

## 2020-05-28 LAB — AMMONIA: Ammonia: 22 umol/L (ref 9–35)

## 2020-05-28 LAB — FERRITIN: Ferritin: 902 ng/mL — ABNORMAL HIGH (ref 24–336)

## 2020-05-28 LAB — ABO/RH: ABO/RH(D): O POS

## 2020-05-28 LAB — VITAMIN B12: Vitamin B-12: 320 pg/mL (ref 180–914)

## 2020-05-28 LAB — MAGNESIUM: Magnesium: 1.6 mg/dL — ABNORMAL LOW (ref 1.7–2.4)

## 2020-05-28 LAB — GLUCOSE, CAPILLARY: Glucose-Capillary: 126 mg/dL — ABNORMAL HIGH (ref 70–99)

## 2020-05-28 LAB — TSH: TSH: 1.767 u[IU]/mL (ref 0.350–4.500)

## 2020-05-28 MED ORDER — ASCORBIC ACID 500 MG PO TABS
500.0000 mg | ORAL_TABLET | Freq: Every day | ORAL | Status: DC
  Administered 2020-05-28 – 2020-06-01 (×5): 500 mg via ORAL
  Filled 2020-05-28 (×5): qty 1

## 2020-05-28 MED ORDER — HYDRALAZINE HCL 25 MG PO TABS
25.0000 mg | ORAL_TABLET | Freq: Three times a day (TID) | ORAL | Status: DC
  Administered 2020-05-28 – 2020-05-29 (×5): 25 mg via ORAL
  Filled 2020-05-28 (×5): qty 1

## 2020-05-28 MED ORDER — AMLODIPINE BESYLATE 5 MG PO TABS
5.0000 mg | ORAL_TABLET | Freq: Every day | ORAL | Status: DC
  Administered 2020-05-28 – 2020-06-01 (×6): 5 mg via ORAL
  Filled 2020-05-28 (×7): qty 1

## 2020-05-28 MED ORDER — CYPROHEPTADINE HCL 4 MG PO TABS
4.0000 mg | ORAL_TABLET | Freq: Three times a day (TID) | ORAL | Status: DC
  Administered 2020-05-28 – 2020-06-01 (×13): 4 mg via ORAL
  Filled 2020-05-28 (×15): qty 1

## 2020-05-28 MED ORDER — HYDRALAZINE HCL 20 MG/ML IJ SOLN: 10.0000 mg | INTRAMUSCULAR | Status: DC | PRN

## 2020-05-28 MED ORDER — METOPROLOL TARTRATE 25 MG PO TABS
25.0000 mg | ORAL_TABLET | Freq: Two times a day (BID) | ORAL | Status: DC
  Administered 2020-05-28 – 2020-06-01 (×9): 25 mg via ORAL
  Filled 2020-05-28 (×10): qty 1

## 2020-05-28 MED ORDER — SODIUM CHLORIDE 0.9 % IV SOLN: INTRAVENOUS | Status: AC

## 2020-05-28 MED ORDER — ONDANSETRON HCL 4 MG PO TABS: 4.0000 mg | ORAL_TABLET | Freq: Four times a day (QID) | ORAL | Status: DC | PRN

## 2020-05-28 MED ORDER — ONDANSETRON HCL 4 MG/2ML IJ SOLN: 4.0000 mg | Freq: Four times a day (QID) | INTRAMUSCULAR | Status: DC | PRN

## 2020-05-28 MED ORDER — FERROUS SULFATE 325 (65 FE) MG PO TABS
325.0000 mg | ORAL_TABLET | Freq: Three times a day (TID) | ORAL | Status: DC
  Administered 2020-05-28 – 2020-06-01 (×12): 325 mg via ORAL
  Filled 2020-05-28 (×12): qty 1

## 2020-05-28 NOTE — ED Notes (Signed)
Meal tray delivered.

## 2020-05-28 NOTE — H&P (Addendum)
History and Physical    Darvell Monteforte RXV:[id number] DOB: October 24, 1956 DOA: 05/27/2020  PCP: Patient, No Pcp Per  Patient coming from: Home.  History obtained from patient's son since patient is mildly confused.  Chief Complaint: Decreased alertness.  HPI: Fin Hupp is a 64 y.o. male with history of recent admission for intracranial bleed in the setting of hypertension was discharged home on May 16, 2020 and as per the patient's son was doing fine until the last 24 hours when patient has become less alert.  Patient over the last few days has not been eating well.  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  No fever chills or any headache or chest pain or shortness of breath was noted.  ED Course: In the ER patient is oriented to his name.  Patient is unable to move his left upper extremity which is his baseline.  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  Labs show potassium of 3.3 creatinine is increased from 2.122.8 albumin is 3.1 ALT 47 troponins were negative lactic acid negative and hemoglobin is 12.3.  EKG shows sinus rhythm with heart rate around 57 bpm.  Covid test is negative.  Patient admitted for acute renal failure with acute encephalopathy.  UA is pending.  Review of Systems: As per HPI, rest all negative.   Past Medical History:  Diagnosis Date  . Hypertension   . Intracranial bleeding (HCC)     History reviewed. No pertinent surgical history.   reports that he has never smoked. He has never used smokeless tobacco. He reports previous alcohol use. He reports previous drug use.  No Known Allergies  Family History  Family history unknown: Yes    Prior to Admission medications   Medication Sig Start Date End Date Taking? Authorizing Provider  acetaminophen (TYLENOL) 500 MG tablet Take 1,000 mg by mouth every 6  (six) hours as needed.   Yes [provider]  amLODipine (NORVASC) 5 MG tablet Take 1 tablet (5 mg total) by mouth daily. 05/18/20  Yes Layne Benton, NP  Ascorbic Acid (VITAMIN C) 500 MG CAPS Take 1 capsule by mouth daily.   Yes [provider]  cyproheptadine (PERIACTIN) 4 MG tablet Take 4 mg by mouth 3 (three) times daily.    Yes [provider]  ferrous sulfate 325 (65 FE) MG EC tablet Take 325 mg by mouth 3 (three) times daily with meals.   Yes [provider]  hydrochlorothiazide (HYDRODIURIL) 25 MG tablet Take 1 tablet (25 mg total) by mouth daily. 05/18/20  Yes Layne Benton, NP  metoprolol tartrate (LOPRESSOR) 25 MG tablet Take 25 mg by mouth 2 (two) times daily.    Yes [provider]  traZODone (DESYREL) 50 MG tablet Take 50 mg by mouth at bedtime as needed for sleep.   Yes [provider]    Physical Exam: Constitutional: Moderately built and nourished. Vitals:   05/27/20 2345 05/28/20 0000 05/28/20 0008 05/28/20 0208  BP: (!) 111/53 120/79 120/79 (!) 164/73  Pulse: 61 61 61 61  Resp: 17 13 12 16   Temp:      TempSrc:      SpO2: 100% 100% 100% 100%  Weight:       Eyes: Anicteric no pallor. ENMT: No discharge from the ears eyes nose or mouth. Neck: No mass felt.  No neck rigidity. Respiratory: No rhonchi or crepitations. Cardiovascular: S1-S2 heard. Abdomen: Soft nontender bowel sounds present. Musculoskeletal: No edema. Skin: No rash. Neurologic: Patient is alert and oriented to his name is able to move his left upper extremity rest of the extremities are weak. Psychiatric: We will do the name.   Labs on Admission: I have personally reviewed following labs and imaging studies  CBC: Recent Labs  Lab 05/27/20 2059 05/27/20 2217  WBC  --  8.5  NEUTROABS  --  5.7  HGB 11.9* 12.3*  HCT 35.0* 38.7*  MCV  --  98.0  PLT  --  390   Basic Metabolic Panel: Recent Labs  Lab 05/27/20 2059 05/27/20 2217  NA 136  134*  K 3.1* 3.3*  CL  --  99  CO2  --  25  GLUCOSE  --  159*  BUN  --  29*  CREATININE  --  2.86*  CALCIUM  --  9.3   GFR: CrCl cannot be calculated (Unknown ideal weight.). Liver Function Tests: Recent Labs  Lab 05/27/20 2217  AST 23  ALT 47*  ALKPHOS 143*  BILITOT 0.9  PROT 8.1  ALBUMIN 3.1*   No results for input(s): LIPASE, AMYLASE in the last 168 hours. No results for input(s): AMMONIA in the last 168 hours. Coagulation Profile: Recent Labs  Lab 05/27/20 1815  INR 1.1   Cardiac Enzymes: No results for input(s): CKTOTAL, CKMB, CKMBINDEX, TROPONINI in the last 168 hours. BNP (last 3 results) No results for input(s): PROBNP in the last 8760 hours. HbA1C: No results for input(s): HGBA1C in the last 72 hours. CBG: Recent Labs  Lab 05/27/20 2055  GLUCAP 147*   Lipid Profile: No results for input(s): CHOL, HDL, LDLCALC, TRIG, CHOLHDL, LDLDIRECT in the last 72 hours. Thyroid Function Tests: No results for input(s): TSH, T4TOTAL, FREET4, T3FREE, THYROIDAB in the last 72 hours. Anemia Panel: No results for input(s): VITAMINB12, FOLATE, FERRITIN, TIBC, IRON, RETICCTPCT in the last 72 hours. Urine analysis: No results found for: COLORURINE, APPEARANCEUR, LABSPEC, PHURINE, GLUCOSEU, HGBUR, BILIRUBINUR, KETONESUR, PROTEINUR, UROBILINOGEN, NITRITE, LEUKOCYTESUR Sepsis Labs: @LABRCNTIP (procalcitonin:4,lacticidven:4) ) Recent Results (from the past 240 hour(s))  SARS Coronavirus 2 by RT PCR (hospital order, performed in [facility] hospital lab) Nasopharyngeal Nasopharyngeal Swab     Status: None   Collection Time: 05/27/20  3:38 PM   Specimen: Nasopharyngeal Swab  Result Value Ref Range Status   SARS Coronavirus 2 NEGATIVE NEGATIVE Final    Comment: (NOTE) SARS-CoV-2 target nucleic acids are NOT DETECTED.  The SARS-CoV-2 RNA is generally detectable in upper and lower respiratory specimens during the acute phase of infection. The lowest concentration of SARS-CoV-2  viral copies this assay can detect is 250 copies / mL. A negative result does not preclude SARS-CoV-2 infection and should not be used as the sole basis for treatment or other patient management decisions.  A negative result may occur with improper specimen collection / handling, submission of specimen other than nasopharyngeal swab, presence of viral mutation(s) within the areas targeted by this assay, and inadequate number of viral copies (<250 copies / mL). A negative result must be combined with clinical observations, patient history, and epidemiological information.  Fact Sheet for Patients:   05/29/20  Fact Sheet for Healthcare Providers: BoilerBrush.com.cy  This test is not yet approved or  cleared by the https://pope.com/ FDA and has been authorized for detection and/or diagnosis of SARS-CoV-2 by FDA under an Emergency Use Authorization (EUA).  This EUA will  remain in effect (meaning this test can be used) for the duration of the COVID-19 declaration under Section 564(b)(1) of the Act, 21 U.S.C. section 360bbb-3(b)(1), unless the authorization is terminated or revoked sooner.  Performed at [facility], [address], [place], [place] [address]      Radiological Exams on Admission: CT Head Wo Contrast  Result Date: 05/27/2020 CLINICAL DATA:  Encephalopathy.  Follow-up left thalamic hemorrhage. EXAM: CT HEAD WITHOUT CONTRAST TECHNIQUE: Contiguous axial images were obtained from the base of the skull through the vertex without intravenous contrast. COMPARISON:  05/12/2020 FINDINGS: Brain: Examination demonstrates interval decrease in size of patient's acute focal hemorrhage over the region of the left thalamus now measuring 1 x 1 x 1.6 cm (previously 1.6 x 1.7 x 2.4 cm). Persistent mild adjacent edema. Interval improvement with tiny amount of residual intraventricular hemorrhage over the dependent portion of the  occipital horns. No new areas of hemorrhage. Ventricles, cisterns and other CSF spaces otherwise unchanged. No evidence of mass or mass effect. No midline shift. Evidence of chronic ischemic microvascular disease. Vascular: No hyperdense vessel or unexpected calcification. Skull: Normal. Negative for fracture or focal lesion. Sinuses/Orbits: Orbits are normal. Paranasal sinuses are well developed with small air-fluid levels over the sphenoid sinuses. Other: None. IMPRESSION: 1. Interval decrease in size of patient's acute hemorrhage over the left thalamus now measuring 1 x 1 x 1.6 cm (previously 1.6 x 1.7 x 2.4 cm). Stable mild adjacent edema. Interval improvement and associated intraventricular hemorrhage with only minimal residual hemorrhage over the occipital horns. No new areas of hemorrhage. 2.  Mild chronic ischemic microvascular disease. Electronically Signed   By: Elberta Fortis M.D.   On: 05/27/2020 16:37   DG Chest Port 1 View  Result Date: 05/27/2020 CLINICAL DATA:  Altered mental status.  Weakness. EXAM: PORTABLE CHEST 1 VIEW COMPARISON:  05/12/2020 FINDINGS: Lungs are adequately inflated without focal airspace consolidation, effusion or pneumothorax. Interval resolution of the previously seen moderate right effusion. Cardiomediastinal silhouette and remainder of the exam is unchanged. IMPRESSION: No acute cardiopulmonary disease. Electronically Signed   By: Elberta Fortis M.D.   On: 05/27/2020 16:30    EKG: Independently reviewed.  Sinus bradycardia with heart rate around 57 bpm.  Assessment/Plan Active Problems:   ICH (intracerebral hemorrhage) (HCC) d/t HTN   Essential hypertension   CKD (chronic kidney disease), stage III   ARF (acute renal failure) (HCC)   Acute encephalopathy    1. Acute encephalopathy could be from dehydration.  For now we will gently hydrating.  Will check in addition ammonia levels TSH B12 levels.  If symptoms do not improve with hydration may consider MRI brain.   I did discuss with on-call neurologist Dr. Otelia Limes.  UA is pending.  Presently afebrile.  Will also get a swallow evaluation. 2. Acute renal failure could be from poor oral intake and patient also had hydrochlorothiazide recently added.  I am holding patient's hydrochlorothiazide gently hydrating and follow metabolic panel.  UA is pending. 3. Hypertension we will need close monitoring for blood pressure with aggressive control given the recent intracranial bleed.  Holding hydrochlorothiazide due to acute renal failure will continue amlodipine and metoprolol we will keep patient on as needed IV hydralazine.  And also add hydralazine since patient's hydrochlorothiazide has been stopped. 4. Anemia appears to be chronic we will check anemia panel given the acute confusion. 5. Mild hypokalemia could be from diuretic use and poor oral intake.  Replace and recheck will check  magnesium with next blood draw.  Since patient is acute renal failure with acute encephalopathy with recent intracranial bleed and other comorbidities will need close monitoring for any further deterioration in inpatient status.   DVT prophylaxis: SCDs due to recent intracranial bleed. Code Status: Full code. Family Communication: Patient's son. Disposition Plan: To be determined. Consults called: Discussed with neurologist. Admission status: Inpatient.   Rise Patience MD [facility] Pager [phone].  If 7PM-7AM, please contact night-coverage [email or web address] Password [facility]  05/28/2020, 2:49 AM

## 2020-05-28 NOTE — ED Notes (Signed)
In and out performed; no urine output.

## 2020-05-28 NOTE — ED Notes (Signed)
Lunch Tray Ordered @ 1050. 

## 2020-05-28 NOTE — Progress Notes (Signed)
Terry Barton is a 64 yr old man who had been discharged to home on 05/16/2020 after an inpatient stay for an intracranial bleed in the setting of hypertension. According to the patient's son he had been doing well except for not eating well for the last 3 days until 6/20-21 when he had become less alert. The patient is chronically bed bound from spinal cord injury in 2016.   In the emergency department the patient was oriented only to self. He was unable to move his left upper extremity which is his baseline from his spinal cord injury and right upper extremity weakness from recent thalamic bleed. Labwork in the ED demonstrated mild hypokalemia at 3.3, creatinine that was elevated above baseline of 2.1 at 2.8, and an albumin of 3.1. CBC demonstrated a hemoglobin of 12.3. Troponins were negative as was lactic acid. EKG demonstrated sinus rhythm with HR of 57. Repeat CT head demonstrated improved appearance of intracranial bleed with no new pathology.   The patient is resting comfortably. He is responsive to verbal stimulation. He is oriented x 1. Heart and lung exam are normal. Abdomen is soft, non-tender, non-distended. Left upper extremity was flaccid. Right upper extremity displayed 3/4 muscle weakness. He is fluent verbally. He quickly goes back to sleep after exam.   [facility] were called to admit the patient for further evaluation and treatment.    The patient is felt to have acute encephalopathy due to dehydration and AKI. However, patient does have a new neurological deficit.   He will be admitted to a telemetry bed. He will receive hydration. MRI of the Brain will be obtained to rule out new stroke.

## 2020-05-29 ENCOUNTER — Inpatient Hospital Stay (HOSPITAL_COMMUNITY): Payer: Medicare Other

## 2020-05-29 DIAGNOSIS — N390 Urinary tract infection, site not specified: Secondary | ICD-10-CM

## 2020-05-29 DIAGNOSIS — E860 Dehydration: Secondary | ICD-10-CM

## 2020-05-29 DIAGNOSIS — E876 Hypokalemia: Secondary | ICD-10-CM

## 2020-05-29 LAB — GLUCOSE, CAPILLARY
Glucose-Capillary: 117 mg/dL — ABNORMAL HIGH (ref 70–99)
Glucose-Capillary: 184 mg/dL — ABNORMAL HIGH (ref 70–99)
Glucose-Capillary: 152 mg/dL — ABNORMAL HIGH (ref 70–99)

## 2020-05-29 LAB — BASIC METABOLIC PANEL
Anion gap: 11 (ref 5–15)
BUN: 30 mg/dL — ABNORMAL HIGH (ref 8–23)
CO2: 21 mmol/L — ABNORMAL LOW (ref 22–32)
Calcium: 8.9 mg/dL (ref 8.9–10.3)
Chloride: 104 mmol/L (ref 98–111)
Creatinine, Ser: 2.24 mg/dL — ABNORMAL HIGH (ref 0.61–1.24)
GFR calc Af Amer: 34 mL/min — ABNORMAL LOW (ref >60)
GFR calc non Af Amer: 29 mL/min — ABNORMAL LOW (ref >60)
Glucose, Bld: 149 mg/dL — ABNORMAL HIGH (ref 70–99)
Potassium: 3.9 mmol/L (ref 3.5–5.1)
Sodium: 136 mmol/L (ref 135–145)

## 2020-05-29 MED ORDER — ORAL CARE MOUTH RINSE
15.0000 mL | Freq: Two times a day (BID) | OROMUCOSAL | Status: DC
  Administered 2020-05-29 – 2020-06-01 (×7): 15 mL via OROMUCOSAL

## 2020-05-29 MED ORDER — PNEUMOCOCCAL VAC POLYVALENT 25 MCG/0.5ML IJ INJ
0.5000 mL | INJECTION | INTRAMUSCULAR | Status: AC
  Administered 2020-05-30: 0.5 mL via INTRAMUSCULAR
  Filled 2020-05-29: qty 0.5

## 2020-05-29 MED ORDER — HYDRALAZINE HCL 25 MG PO TABS
25.0000 mg | ORAL_TABLET | Freq: Four times a day (QID) | ORAL | Status: DC
  Administered 2020-05-29 – 2020-05-30 (×3): 25 mg via ORAL
  Filled 2020-05-29 (×3): qty 1

## 2020-05-29 NOTE — Evaluation (Signed)
Clinical/Bedside Swallow Evaluation Patient Details  Name: Terry Barton MRN: [id number] Date of Birth: 01-Sep-1956  Today's Date: 05/29/2020 Time: SLP Start Time (ACUTE ONLY): 1037 SLP Stop Time (ACUTE ONLY): 1054 SLP Time Calculation (min) (ACUTE ONLY): 17 min  Past Medical History:  Past Medical History:  Diagnosis Date  . Hypertension   . Intracranial bleeding [facility])    Past Surgical History: History reviewed. No pertinent surgical history. HPI:   Terry Barton is a 64 y.o. male with history of recent admission for intracranial bleed and discharged to home 6/9. Admitted with decreased alertness and mild confusion. 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 CXR no acute cardiopulmonary disease. CT Interval decrease in size of patient's acute hemorrhage over the left thalamus now measuring 1 x 1 x 1.6 cm (previously 1.6 x 1.7 x 2.4 cm). Stable mild adjacent edema. 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 Cough and vocal intensity are strong. He exhibited intermittent wet vocal quality (after 3 oz water & delayed after water 1-2 sips) with spontaneous throat clear indicative of possible intact sensation from subjective observation. There was no overt coughing although he is at higher risk due to stroke s/p 3 weeks. He reports some difficulty masticating meats if tough and Dys 3 (chopped meats) recommended (pt agreeable). ST will follow up.          SLP Visit Diagnosis: Dysphagia, unspecified (R13.10)    Aspiration Risk  Mild aspiration risk    Diet Recommendation Dysphagia 3 (Mech soft);Thin liquid   Liquid Administration via: Straw;Cup Medication Administration: Whole meds with liquid Supervision: Patient able to self feed;Intermittent supervision to cue for compensatory strategies (needs set up, ) Compensations: Slow rate;Small  sips/bites;Clear throat intermittently Postural Changes: Seated upright at 90 degrees    Other  Recommendations Oral Care Recommendations: Oral care BID   Follow up Recommendations None      Frequency and Duration            Prognosis        Swallow Study   General Date of Onset: 05/27/20 HPI:  Terry Barton is a 64 y.o. male with history of recent admission for intracranial bleed and discharged to home 6/9. Admitted with decreased alertness and mild confusion. 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 CXR no acute cardiopulmonary disease. CT Interval decrease in size of patient's acute hemorrhage over the left thalamus now measuring 1 x 1 x 1.6 cm (previously 1.6 x 1.7 x 2.4 cm). Stable mild adjacent edema. Interval improvement and Type of Study: Bedside Swallow Evaluation Previous Swallow Assessment:  (none) Diet Prior to this Study: Regular;Thin liquids Temperature Spikes Noted: No Respiratory Status: Room air History of Recent Intubation: No Behavior/Cognition: Alert;Cooperative;Pleasant mood Oral Cavity Assessment: Within Functional Limits Oral Care Completed by SLP: No Oral Cavity - Dentition: Missing dentition (Has total of 6-7 teeth) Vision: Functional for self-feeding Self-Feeding Abilities: Able to feed self Patient Positioning: Upright in bed Baseline Vocal Quality: Normal Volitional Cough: Strong Volitional Swallow: Able to elicit    Oral/Motor/Sensory Function Overall Oral Motor/Sensory Function: Mild impairment Facial ROM: Reduced right;Suspected CN VII (facial) dysfunction Facial Symmetry: Abnormal symmetry right;Suspected CN VII (facial) dysfunction Facial Strength: Reduced right;Suspected CN VII (facial) dysfunction Lingual ROM: Within Functional Limits Lingual Symmetry: Within Functional Limits Lingual Strength: Within Functional Limits Velum: Within Functional Limits Mandible: Within Functional Limits   Ice  Chips Ice chips: Not tested   Thin  Liquid Thin Liquid: Impaired Presentation: Straw Oral Phase Impairments:  (none) Pharyngeal  Phase Impairments: Wet Vocal Quality;Throat Clearing - Delayed    Nectar Thick Nectar Thick Liquid: Not tested   Honey Thick Honey Thick Liquid: Not tested   Puree Puree: Within functional limits Presentation: Spoon   Solid     Solid: Within functional limits      Royce Macadamia 05/29/2020,1:00 PM  Breck Coons [place].Ed Nurse, children's [phone] Office [phone]

## 2020-05-29 NOTE — Progress Notes (Signed)
PROGRESS NOTE  Izaih Kataoka ZOX:[id number] DOB: [birth year] DOA: 05/27/2020 PCP: Patient, No Pcp Per  Brief History   Zeev Deakins is a 64 y.o. male with history of recent admission for intracranial bleed in the setting of hypertension was discharged home on May 16, 2020 and as per the patient's son was doing fine until the last 24 hours when patient has become less alert.  Patient over the last few days has not been eating well.  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  No fever chills or any headache or chest pain or shortness of breath was noted.  ED Course: In the ER patient is oriented to his name.  Patient is unable to move his left upper extremity which is his baseline.  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  Labs show potassium of 3.3 creatinine is increased from 2.122.8 albumin is 3.1 ALT 47 troponins were negative lactic acid negative and hemoglobin is 12.3.  EKG shows sinus rhythm with heart rate around 57 bpm.  Covid test is negative.  Patient admitted for acute renal failure with acute encephalopathy.  UA is positive for UTI. Urine culture is pending.  [facility] have been consulted. The patient has been admitted to a telemetry bed. He has been given IV hydration, and electrolytes have been corrected. The patient's sensorium is improving. Renal insufficiency is slow to improve. SLP has been asked to evaluate the patient's swallow.  Consultants  . None  Procedures  . None  Antibiotics   Anti-infectives (From admission, onward)   None    .  Subjective  The patient is resting comfortably. No new complaints.  Objective   Vitals:  Vitals:   05/29/20 1125 05/29/20 1541  BP: 140/63 (!) 149/64  Pulse: 70 75  Resp: (!) 22 15  Temp: 98.5 F (36.9 C) 98.8 F (37.1 C)  SpO2: 100% 100%     Exam:  Constitutional:  . The patient is awake, alert, and oriented x 3. No acute distress. Respiratory:  . No increased work of breathing. . No wheezes, rales, or rhonchi . No tactile fremitus Cardiovascular:  . Regular rate and rhythm . No murmurs, ectopy, or gallups. . No lateral PMI. No thrills. Abdomen:  . Abdomen is soft, non-tender, non-distended . No hernias, masses, or organomegaly . Normoactive bowel sounds.  Musculoskeletal:  . No cyanosis, clubbing, or edema Skin:  . No rashes, lesions, ulcers . palpation of skin: no induration or nodules Neurologic:  . CN 2-12 intact . Sensation all 4 extremities intact Psychiatric:  . Mental status o Mood, affect appropriate o Orientation to person, place, time  . judgment and insight appear intact   I have personally reviewed the following:   Today's Data  . Vitals, BMP  Micro Data  . Blood cultures x 2 . Urine cultures  Scheduled Meds: . amLODipine  5 mg Oral Daily  . ascorbic acid  500 mg Oral Daily  . cyproheptadine  4 mg Oral TID  . ferrous sulfate  325 mg Oral TID WC  . hydrALAZINE  25 mg Oral Q8H  . mouth rinse  15 mL Mouth Rinse BID  . metoprolol tartrate  25 mg Oral BID  . [START ON 05/30/2020] pneumococcal 23 valent vaccine  0.5 mL Intramuscular Tomorrow-1000   Continuous Infusions: . sodium chloride      Active  Problems:   ICH (intracerebral hemorrhage) (HCC) d/t HTN   Essential hypertension   CKD (chronic kidney disease), stage III   ARF (acute renal failure) (HCC)   Acute encephalopathy   LOS: 2 days   A & P  Acute encephalopathy: Likely due to dehydration in the setting of an acute UTI. Improved with IV fluids.  from dehydration.   Acute on chronic renal failure:  Creatinine is slowly improving. Baseline appears to be could be 1.72.  Acute component likely due to poor oral intake and recent addition of HCTZ to his antihypertensive regimen. This has been stopped.  Hypertension: Fair  control with discontinuation of HCTZ and continuation of bisoprolol. have added hydralazine. Monitor closely.  UTI: Positive UA. Urine culture is pending.  Anemia: Iron studies pending. Likely at least partly due to chronic kidney disease.   Hypokalemia.Hypomagnesemia: Likely due to HCTZ which has now been stopped.  Monitor and supplement as necessary  I have seen and examined this patient myself. I have spent 44 minutes in his evaluation and care.  DVT prophylaxis: SCD's CODE STATUS: Full Code Family Communication: I have discussed the patient in detail with his son today. All questions answered to the best of my ability. Disposition: The patient is from home. Anticipate discharge to home  Status is: Inpatient  Remains inpatient appropriate because:IV treatments appropriate due to intensity of illness or inability to take PO   Dispo: The patient is from: Home              Anticipated d/c is to: Home              Anticipated d/c date is: 2 days              Patient currently is not medically stable to d/c.  Halena Mohar, DO [facility] Direct contact: see [email or web address]  7PM-7AM contact night coverage as above 05/29/2020, 5:08 PM  LOS: 2 days

## 2020-05-30 DIAGNOSIS — R531 Weakness: Secondary | ICD-10-CM

## 2020-05-30 LAB — COMPREHENSIVE METABOLIC PANEL
ALT: 26 U/L (ref 0–44)
AST: 20 U/L (ref 15–41)
Albumin: 2.6 g/dL — ABNORMAL LOW (ref 3.5–5.0)
Alkaline Phosphatase: 97 U/L (ref 38–126)
Anion gap: 11 (ref 5–15)
BUN: 32 mg/dL — ABNORMAL HIGH (ref 8–23)
CO2: 19 mmol/L — ABNORMAL LOW (ref 22–32)
Calcium: 8.7 mg/dL — ABNORMAL LOW (ref 8.9–10.3)
Chloride: 102 mmol/L (ref 98–111)
Creatinine, Ser: 2.27 mg/dL — ABNORMAL HIGH (ref 0.61–1.24)
GFR calc Af Amer: 34 mL/min — ABNORMAL LOW (ref >60)
GFR calc non Af Amer: 29 mL/min — ABNORMAL LOW (ref >60)
Glucose, Bld: 154 mg/dL — ABNORMAL HIGH (ref 70–99)
Potassium: 3.5 mmol/L (ref 3.5–5.1)
Sodium: 132 mmol/L — ABNORMAL LOW (ref 135–145)
Total Bilirubin: 0.7 mg/dL (ref 0.3–1.2)
Total Protein: 6.8 g/dL (ref 6.5–8.1)

## 2020-05-30 LAB — CBC WITH DIFFERENTIAL/PLATELET
Abs Immature Granulocytes: 0.1 10*3/uL — ABNORMAL HIGH (ref 0.00–0.07)
Basophils Absolute: 0 10*3/uL (ref 0.0–0.1)
Basophils Relative: 0 %
Eosinophils Absolute: 0.1 10*3/uL (ref 0.0–0.5)
Eosinophils Relative: 1 %
HCT: 31.1 % — ABNORMAL LOW (ref 39.0–52.0)
Hemoglobin: 10.2 g/dL — ABNORMAL LOW (ref 13.0–17.0)
Immature Granulocytes: 1 %
Lymphocytes Relative: 23 %
Lymphs Abs: 2.2 10*3/uL (ref 0.7–4.0)
MCH: 30.9 pg (ref 26.0–34.0)
MCHC: 32.8 g/dL (ref 30.0–36.0)
MCV: 94.2 fL (ref 80.0–100.0)
Monocytes Absolute: 1.3 10*3/uL — ABNORMAL HIGH (ref 0.1–1.0)
Monocytes Relative: 13 %
Neutro Abs: 5.8 10*3/uL (ref 1.7–7.7)
Neutrophils Relative %: 62 %
Platelets: 383 10*3/uL (ref 150–400)
RBC: 3.3 MIL/uL — ABNORMAL LOW (ref 4.22–5.81)
RDW: 12.8 % (ref 11.5–15.5)
WBC: 9.6 10*3/uL (ref 4.0–10.5)
nRBC: 0 % (ref 0.0–0.2)

## 2020-05-30 LAB — MAGNESIUM: Magnesium: 1.4 mg/dL — ABNORMAL LOW (ref 1.7–2.4)

## 2020-05-30 LAB — GLUCOSE, CAPILLARY
Glucose-Capillary: 159 mg/dL — ABNORMAL HIGH (ref 70–99)
Glucose-Capillary: 123 mg/dL — ABNORMAL HIGH (ref 70–99)
Glucose-Capillary: 212 mg/dL — ABNORMAL HIGH (ref 70–99)
Glucose-Capillary: 271 mg/dL — ABNORMAL HIGH (ref 70–99)

## 2020-05-30 MED ORDER — LACTATED RINGERS IV SOLN: INTRAVENOUS | Status: AC

## 2020-05-30 MED ORDER — POTASSIUM CHLORIDE CRYS ER 20 MEQ PO TBCR
40.0000 meq | EXTENDED_RELEASE_TABLET | Freq: Once | ORAL | Status: AC
  Administered 2020-05-30: 40 meq via ORAL
  Filled 2020-05-30: qty 2

## 2020-05-30 MED ORDER — SODIUM CHLORIDE 0.9 % IV SOLN
1.0000 g | Freq: Every day | INTRAVENOUS | Status: DC
  Administered 2020-05-30 – 2020-06-01 (×3): 1 g via INTRAVENOUS
  Filled 2020-05-30 (×3): qty 10

## 2020-05-30 MED ORDER — PANTOPRAZOLE SODIUM 40 MG PO TBEC
40.0000 mg | DELAYED_RELEASE_TABLET | Freq: Every day | ORAL | Status: DC
  Administered 2020-05-30 – 2020-06-01 (×3): 40 mg via ORAL
  Filled 2020-05-30 (×3): qty 1

## 2020-05-30 MED ORDER — DEXAMETHASONE SODIUM PHOSPHATE 10 MG/ML IJ SOLN
6.0000 mg | INTRAMUSCULAR | Status: DC
  Administered 2020-05-30 – 2020-06-01 (×3): 6 mg via INTRAVENOUS
  Filled 2020-05-30 (×3): qty 1

## 2020-05-30 NOTE — Progress Notes (Signed)
PROGRESS NOTE                                                                                                                                                                                                             Patient Demographics:    Terry Barton, is a 64 y.o. male, DOB - 1956-01-05, YPP:[id number]  Admit date - 05/27/2020   Admitting Physician Eduard Clos, MD  Outpatient Primary MD for the patient is Patient, No Pcp Per  LOS - 3  Chief Complaint  Patient presents with  . 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  He was now brought to the hospital for increased confusion and lack of responsiveness, was diagnosed with UTI and dehydration and admitted.   Subjective:    [name] today has, No headache, No chest pain, No abdominal pain - No Nausea, No new weakness tingling or numbness, No Cough - SOB.     Assessment  & Plan :     1.  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  Discussed with son, goal of care is medical treatment, he is DNR, if declines focus on comfort.   2.  Dysphagia.  Due to #1 above.  Speech following currently on dysphagia 3 diet.  3.   Hypertension.  Continue Norvasc, metoprolol with as needed hydralazine and monitor.  4.  Anemia of chronic disease.  Stable.  5.  AKI on CKD 3 with baseline creatinine of close to 2.  Due to dehydration.  Start on IV fluids, hold nephrotoxins, check bladder scan.     Condition - Extremely Guarded  Family Communication  :  Son [name] on his cell phone  [phone] on 05/30/2020    Code Status :  DNR  Consults  : None  Procedures  :    CT head  X 2 - 1. Unchanged size of left thalamic intraparenchymal hematoma with mild surrounding edema. 2. Unchanged small amount of blood layering in both occipital horns. 3. No new site of hemorrhage  PUD Prophylaxis : PPI  Disposition Plan  :    Status is: Inpatient  Remains inpatient appropriate because:IV treatments appropriate due to intensity of illness or inability to take PO   Dispo: The patient is from: Home              Anticipated d/c is to: Home              Anticipated d/c date is: > 3 days              Patient currently is not medically stable to d/c.  DVT Prophylaxis  :   SCDs   Lab Results  Component Value Date   PLT 383 05/30/2020    Diet :  Diet Order            DIET DYS 3 Room service appropriate? No; Fluid consistency: Thin  Diet effective now                  Inpatient Medications  Scheduled Meds: . amLODipine  5 mg Oral Daily  . ascorbic acid  500 mg Oral Daily  . cyproheptadine  4 mg Oral TID  . dexamethasone (DECADRON) injection  6 mg Intravenous Q24H  . ferrous sulfate  325 mg Oral TID WC  . hydrALAZINE  25 mg Oral Q6H  . mouth rinse  15 mL Mouth Rinse BID  . metoprolol tartrate  25 mg Oral BID  . pneumococcal 23 valent vaccine  0.5 mL Intramuscular Tomorrow-1000   Continuous Infusions: . cefTRIAXone (ROCEPHIN)  IV 1 g (05/30/20 0845)  . lactated ringers    . sodium chloride     PRN Meds:.hydrALAZINE, [DISCONTINUED] ondansetron **OR** ondansetron (ZOFRAN) IV  Antibiotics  :     Anti-infectives (From admission, onward)   Start     Dose/Rate Route Frequency Ordered Stop   05/30/20 0800  cefTRIAXone (ROCEPHIN) 1 g in sodium chloride 0.9 % 100 mL IVPB     Discontinue     1 g 200 mL/hr over 30 Minutes Intravenous Daily 05/30/20 0723 06/04/20 0959         Objective:   Vitals:   05/30/20 0400 05/30/20 0645 05/30/20 0725 05/30/20 0727  BP: 117/61 (!) 124/53 126/60 126/60  Pulse: 70 70 69 69  Resp: 16 20 15 12   Temp: 99.9 F (37.7 C)  97.9 F (36.6 C) 99.8 F (37.7 C)  TempSrc: Axillary  Axillary Axillary  SpO2: 100% 98% 100% 100%  Weight:      Height:        SpO2: 100 %  Wt Readings from Last 3 Encounters:  05/29/20 79.2 kg  05/12/20 77.7 kg     Intake/Output Summary (Last 24 hours) at 05/30/2020 0912 Last data filed at 05/30/2020 0700 Gross per 24 hour  Intake 210 ml  Output 1100 ml  Net -890 ml     Physical Exam  Awake but somewhat confused, right-sided facial droop, 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  Symmetrical Chest wall movement,  Good air movement bilaterally, CTAB RRR,No Gallops,Rubs or new Murmurs, No Parasternal Heave +ve B.Sounds, Abd Soft, No tenderness, No organomegaly appriciated, No rebound - guarding or rigidity. No Cyanosis, Clubbing or edema, No new Rash or bruise      Data Review:    Recent Labs  Lab 05/27/20 2059 05/27/20 2217 05/28/20 0755 05/30/20 0324  WBC  --  8.5 12.5* 9.6  HGB 11.9* 12.3* 12.6* 10.2*  HCT 35.0* 38.7* 40.2 31.1*  PLT  --  390 413* 383  MCV  --  98.0 98.3 94.2  MCH  --  31.1 30.8 30.9  MCHC  --  31.8 31.3 32.8  RDW  --  12.8 12.9 12.8  LYMPHSABS  --  1.8 2.0 2.2  MONOABS  --  0.8 2.1* 1.3*  EOSABS  --  0.1 0.1 0.1  BASOSABS  --  0.0 0.1 0.0    Recent Labs  Lab 05/27/20 1815 05/27/20 2059  05/27/20 2217 05/28/20 0755 05/28/20 0806 05/29/20 1010 05/30/20 0324 05/30/20 0753  NA  --  136 134* 137  --  136 132*  --   K  --  3.1* 3.3* 3.8  --  3.9 3.5  --   CL  --   --  99 103  --  104 102  --   CO2  --   --  25 21*  --  21* 19*  --   GLUCOSE  --   --  159* 121*  --  149* 154*  --   BUN  --   --  29* 32*  --  30* 32*  --   CREATININE  --   --  2.86* 2.64*  --  2.24* 2.27*  --   CALCIUM  --   --  9.3 9.3  --  8.9 8.7*  --   AST  --   --  23 20  18   --   --  20  --   ALT  --   --  47* 39  39  --   --  26  --   ALKPHOS  --   --  143* 132*  133*  --   --  97  --   BILITOT  --   --  0.9 0.6  0.7  --   --  0.7  --   ALBUMIN  --   --  3.1* 3.1*  3.2*  --   --  2.6*  --   MG  --   --   --  1.6*  --   --   --  1.4*  INR 1.1  --   --   --   --   --   --   --   TSH  --   --   --  1.767  --   --   --   --   AMMONIA  --   --   --   --  22  --   --   --     Recent Labs  Lab 05/27/20 1538  SARSCOV2NAA NEGATIVE    ------------------------------------------------------------------------------------------------------------------ No results for input(s): CHOL, HDL, LDLCALC, TRIG, CHOLHDL, LDLDIRECT in the last 72 hours.  Lab Results  Component Value Date   HGBA1C 5.2 05/12/2020   ------------------------------------------------------------------------------------------------------------------ Recent Labs    05/28/20 0755  TSH 1.767   ------------------------------------------------------------------------------------------------------------------ Recent Labs    05/28/20 0755  VITAMINB12 320  FOLATE 19.9  FERRITIN 902*  RETICCTPCT 1.4  Coagulation profile Recent Labs  Lab 05/27/20 1815  INR 1.1    No results for input(s): DDIMER in the last 72 hours.  Cardiac Enzymes No results for input(s): CKMB, TROPONINI, MYOGLOBIN in the last 168 hours.  Invalid input(s):  CK ------------------------------------------------------------------------------------------------------------------ No results found for: BNP  Micro Results Recent Results (from the past 240 hour(s))  SARS Coronavirus 2 by RT PCR (hospital order, performed in [facility] hospital lab) Nasopharyngeal Nasopharyngeal Swab     Status: None   Collection Time: 05/27/20  3:38 PM   Specimen: Nasopharyngeal Swab  Result Value Ref Range Status   SARS Coronavirus 2 NEGATIVE NEGATIVE Final    Comment: (NOTE) SARS-CoV-2 target nucleic acids are NOT DETECTED.  The SARS-CoV-2 RNA is generally detectable in upper and lower respiratory specimens during the acute phase of infection. The lowest concentration of SARS-CoV-2 viral copies this assay can detect is 250 copies / mL. A negative result does not preclude SARS-CoV-2 infection and should not be used as the sole basis for treatment or other patient management decisions.  A negative result may occur with improper specimen collection / handling, submission of specimen other than nasopharyngeal swab, presence of viral mutation(s) within the areas targeted by this assay, and inadequate number of viral copies (<250 copies / mL). A negative result must be combined with clinical observations, patient history, and epidemiological information.  Fact Sheet for Patients:   BoilerBrush.com.cy  Fact Sheet for Healthcare Providers: https://pope.com/  This test is not yet approved or  cleared by the [place] FDA and has been authorized for detection and/or diagnosis of SARS-CoV-2 by FDA under an Emergency Use Authorization (EUA).  This EUA will remain in effect (meaning this test can be used) for the duration of the COVID-19 declaration under Section 564(b)(1) of the Act, 21 U.S.C. section 360bbb-3(b)(1), unless the authorization is terminated or revoked sooner.  Performed at [facility],  [address], [place], [place] [address]   Culture, blood (routine x 2)     Status: None (Preliminary result)   Collection Time: 05/27/20  6:15 PM   Specimen: BLOOD  Result Value Ref Range Status   Specimen Description BLOOD SITE NOT SPECIFIED  Final   Special Requests   Final    BOTTLES DRAWN AEROBIC AND ANAEROBIC Blood Culture adequate volume   Culture   Final    NO GROWTH 3 DAYS Performed at [facility], [address], [place], [place] [address]    Report Status PENDING  Incomplete  Culture, blood (routine x 2)     Status: None (Preliminary result)   Collection Time: 05/27/20 10:16 PM   Specimen: BLOOD  Result Value Ref Range Status   Specimen Description BLOOD LEFT UPPER ARM  Final   Special Requests   Final    BOTTLES DRAWN AEROBIC AND ANAEROBIC Blood Culture adequate volume   Culture   Final    NO GROWTH 3 DAYS Performed at [facility], [address] [place], [place] [address]    Report Status PENDING  Incomplete    Radiology Reports Chest 2 View  Result Date: 05/12/2020 CLINICAL DATA:  New onset slurred speech and right arm and right leg weakness. EXAM: CHEST - 2 VIEW COMPARISON:  None. FINDINGS: There is right-sided opacity extending from the mid lung to the right lung base, obscuring the right hemidiaphragm and portions of the right heart border. In part this is due to a moderate pleural effusion. There is associated  parenchymal opacity that is consistent with atelectasis or infection. Remainder of the lungs is clear. No left pleural effusion. No pneumothorax. Cardiac silhouette is top-normal in size. No mediastinal or hilar masses or convincing adenopathy. Skeletal structures are grossly intact. IMPRESSION: 1. Moderate right-sided pleural effusion associated with right lung base parenchymal opacity, the latter consistent with atelectasis or pneumonia. 2. No other evidence of acute cardiopulmonary disease. No pulmonary edema. Electronically Signed   By: Amie Portland M.D.   On: 05/12/2020 11:08   CT HEAD WO CONTRAST  Result Date: 05/29/2020 CLINICAL DATA:  Stroke follow-up EXAM: CT HEAD WITHOUT CONTRAST TECHNIQUE: Contiguous axial images were obtained from the base of the skull through the vertex without intravenous contrast. COMPARISON:  Head CT 05/27/2020 FINDINGS: Brain: Unchanged size of left thalamic intraparenchymal hematoma mild surrounding edema. Small amount of blood layering in both occipital horns is unchanged. No new site of hemorrhage. Size and configuration of the ventricles are unchanged. There is generalized volume loss. Vascular: No abnormal hyperdensity of the major intracranial arteries or dural venous sinuses. No intracranial atherosclerosis. Skull: The visualized skull base, calvarium and extracranial soft tissues are normal. Sinuses/Orbits: Small amount of fluid in the sphenoid sinus. The orbits are normal. IMPRESSION: 1. Unchanged size of left thalamic intraparenchymal hematoma with mild surrounding edema. 2. Unchanged small amount of blood layering in both occipital horns. 3. No new site of hemorrhage. Electronically Signed   By: Deatra Robinson M.D.   On: 05/29/2020 21:14   CT Head Wo Contrast  Result Date: 05/27/2020 CLINICAL DATA:  Encephalopathy.  Follow-up left thalamic hemorrhage. EXAM: CT HEAD WITHOUT CONTRAST TECHNIQUE: Contiguous axial images were obtained from the base of the skull through the vertex without intravenous contrast. COMPARISON:  05/12/2020 FINDINGS: Brain: Examination demonstrates interval decrease in size of patient's acute focal hemorrhage over the region of the left thalamus now measuring 1 x 1 x 1.6 cm (previously 1.6 x 1.7 x 2.4 cm). Persistent mild adjacent edema. Interval improvement with tiny amount of residual intraventricular hemorrhage over the dependent portion of the occipital horns. No new areas of hemorrhage. Ventricles, cisterns and other CSF spaces otherwise unchanged. No evidence of mass or mass  effect. No midline shift. Evidence of chronic ischemic microvascular disease. Vascular: No hyperdense vessel or unexpected calcification. Skull: Normal. Negative for fracture or focal lesion. Sinuses/Orbits: Orbits are normal. Paranasal sinuses are well developed with small air-fluid levels over the sphenoid sinuses. Other: None. IMPRESSION: 1. Interval decrease in size of patient's acute hemorrhage over the left thalamus now measuring 1 x 1 x 1.6 cm (previously 1.6 x 1.7 x 2.4 cm). Stable mild adjacent edema. Interval improvement and associated intraventricular hemorrhage with only minimal residual hemorrhage over the occipital horns. No new areas of hemorrhage. 2.  Mild chronic ischemic microvascular disease. Electronically Signed   By: Elberta Fortis M.D.   On: 05/27/2020 16:37   MR ANGIO HEAD WO CONTRAST  Result Date: 05/13/2020 CLINICAL DATA:  Stroke, follow-up. EXAM: MRI HEAD WITHOUT CONTRAST MRA HEAD WITHOUT CONTRAST TECHNIQUE: Multiplanar, multiecho pulse sequences of the brain and surrounding structures were obtained without intravenous contrast. Angiographic images of the head were obtained using MRA technique without contrast. COMPARISON:  Noncontrast head CT performed earlier the same day 05/12/2020 FINDINGS: MRI HEAD FINDINGS Brain: Mild intermittent motion degradation. An acute parenchymal hemorrhage centered within the left thalamus has not significantly changed in size, measuring 1.7 x 1.8 cm in transaxial dimensions. Similar appearance of surrounding edema. There is no significant ventricular  effacement. No midline shift. Intraventricular extension into the left lateral ventricle does not appear significantly changed. However, there is a new small amount of hemorrhage within the right occipital horn. No hydrocephalus at this time. Mild patchy T2/FLAIR hyperintensity within the cerebral white matter is nonspecific, but consistent with chronic small vessel ischemic disease. Redemonstrated mild  generalized parenchymal atrophy. No evidence of intracranial mass. No extra-axial fluid collection. Vascular: Reported below. Skull and upper cervical spine: No focal marrow lesion. Sinuses/Orbits: Visualized orbits show no acute finding. Mild ethmoid, sphenoid and maxillary sinus mucosal thickening. Trace bilateral mastoid effusions. MRA HEAD FINDINGS The examination is mild to moderately motion degraded, which limits evaluation for stenoses and for small aneurysms. The intracranial internal carotid arteries are patent. There is an apparent severe stenosis within the right ICA at the level of the skull base, which may be accentuated by artifact (series 1030, image 7). No other significant stenosis is identified within the internal carotid arteries. The M1 middle cerebral arteries are patent without significant stenosis. There is an attenuated appearance of multiple mid M2 and more distal right MCA branch vessels. Additionally, there is an attenuated appearance of multiple distal M2 and more distal left MCA branch vessels. Findings may be related to motion artifact. High-grade stenoses within these vessels cannot be excluded. The anterior cerebral arteries are patent without significant proximal stenosis. No intracranial aneurysm is identified. The intracranial vertebral arteries are patent bilaterally with the right being slightly dominant. The basilar artery is patent without significant stenosis. The posterior cerebral arteries are patent proximally without significant stenosis. Attenuated appear of the P3 and more distal PCA branches bilaterally which may reflect motion artifact. Stay high-grade stenoses within these vessels cannot be excluded. IMPRESSION: MRI brain: 1. Unchanged size of an acute left thalamic parenchymal hemorrhage. Surrounding edema has also not significantly changed. No significant ventricular effacement. No midline shift. 2. Similar appearance of ventricular extension into the left lateral  ventricle. New from prior examination, there is a small amount of hemorrhage within the right occipital horn. No evidence of hydrocephalus at this time. 3. Mild generalized parenchymal atrophy and chronic small vessel ischemic disease. 4. Mild ethmoid sinus mucosal thickening. 5. Trace bilateral mastoid effusions. MRA head: 1. Mild-to-moderate motion degradation limits evaluation. 2. No intracranial aneurysm is identified. 3. Apparent severe stenosis within the right ICA at the level of the skull base, which may be accentuated by susceptibility artifact at this level. 4. Attenuated appearance of multiple mid M2 and more distal right MCA branch vessels. Additionally, there is an attenuated appearance of multiple distal M2 and more distal left MCA branch vessels. Findings may be related to motion artifact. High-grade stenoses within these vessels cannot be excluded. 5. Attenuated appearance of the P3 and more distal PCA branches bilaterally. These findings may also reflect motion artifact. High-grade stenoses within these vessels cannot be excluded. Electronically Signed   By: Jackey Loge DO   On: 05/13/2020 14:45   MR BRAIN WO CONTRAST  Result Date: 05/13/2020 CLINICAL DATA:  Stroke, follow-up. EXAM: MRI HEAD WITHOUT CONTRAST MRA HEAD WITHOUT CONTRAST TECHNIQUE: Multiplanar, multiecho pulse sequences of the brain and surrounding structures were obtained without intravenous contrast. Angiographic images of the head were obtained using MRA technique without contrast. COMPARISON:  Noncontrast head CT performed earlier the same day 05/12/2020 FINDINGS: MRI HEAD FINDINGS Brain: Mild intermittent motion degradation. An acute parenchymal hemorrhage centered within the left thalamus has not significantly changed in size, measuring 1.7 x 1.8 cm in transaxial dimensions. Similar  appearance of surrounding edema. There is no significant ventricular effacement. No midline shift. Intraventricular extension into the left  lateral ventricle does not appear significantly changed. However, there is a new small amount of hemorrhage within the right occipital horn. No hydrocephalus at this time. Mild patchy T2/FLAIR hyperintensity within the cerebral white matter is nonspecific, but consistent with chronic small vessel ischemic disease. Redemonstrated mild generalized parenchymal atrophy. No evidence of intracranial mass. No extra-axial fluid collection. Vascular: Reported below. Skull and upper cervical spine: No focal marrow lesion. Sinuses/Orbits: Visualized orbits show no acute finding. Mild ethmoid, sphenoid and maxillary sinus mucosal thickening. Trace bilateral mastoid effusions. MRA HEAD FINDINGS The examination is mild to moderately motion degraded, which limits evaluation for stenoses and for small aneurysms. The intracranial internal carotid arteries are patent. There is an apparent severe stenosis within the right ICA at the level of the skull base, which may be accentuated by artifact (series 1030, image 7). No other significant stenosis is identified within the internal carotid arteries. The M1 middle cerebral arteries are patent without significant stenosis. There is an attenuated appearance of multiple mid M2 and more distal right MCA branch vessels. Additionally, there is an attenuated appearance of multiple distal M2 and more distal left MCA branch vessels. Findings may be related to motion artifact. High-grade stenoses within these vessels cannot be excluded. The anterior cerebral arteries are patent without significant proximal stenosis. No intracranial aneurysm is identified. The intracranial vertebral arteries are patent bilaterally with the right being slightly dominant. The basilar artery is patent without significant stenosis. The posterior cerebral arteries are patent proximally without significant stenosis. Attenuated appear of the P3 and more distal PCA branches bilaterally which may reflect motion artifact.  Stay high-grade stenoses within these vessels cannot be excluded. IMPRESSION: MRI brain: 1. Unchanged size of an acute left thalamic parenchymal hemorrhage. Surrounding edema has also not significantly changed. No significant ventricular effacement. No midline shift. 2. Similar appearance of ventricular extension into the left lateral ventricle. New from prior examination, there is a small amount of hemorrhage within the right occipital horn. No evidence of hydrocephalus at this time. 3. Mild generalized parenchymal atrophy and chronic small vessel ischemic disease. 4. Mild ethmoid sinus mucosal thickening. 5. Trace bilateral mastoid effusions. MRA head: 1. Mild-to-moderate motion degradation limits evaluation. 2. No intracranial aneurysm is identified. 3. Apparent severe stenosis within the right ICA at the level of the skull base, which may be accentuated by susceptibility artifact at this level. 4. Attenuated appearance of multiple mid M2 and more distal right MCA branch vessels. Additionally, there is an attenuated appearance of multiple distal M2 and more distal left MCA branch vessels. Findings may be related to motion artifact. High-grade stenoses within these vessels cannot be excluded. 5. Attenuated appearance of the P3 and more distal PCA branches bilaterally. These findings may also reflect motion artifact. High-grade stenoses within these vessels cannot be excluded. Electronically Signed   By: Jackey Loge DO   On: 05/13/2020 14:45   DG Chest Port 1 View  Result Date: 05/27/2020 CLINICAL DATA:  Altered mental status.  Weakness. EXAM: PORTABLE CHEST 1 VIEW COMPARISON:  05/12/2020 FINDINGS: Lungs are adequately inflated without focal airspace consolidation, effusion or pneumothorax. Interval resolution of the previously seen moderate right effusion. Cardiomediastinal silhouette and remainder of the exam is unchanged. IMPRESSION: No acute cardiopulmonary disease. Electronically Signed   By: Elberta Fortis M.D.   On: 05/27/2020 16:30   ECHOCARDIOGRAM COMPLETE  Result Date: 05/12/2020  ECHOCARDIOGRAM REPORT   Patient Name:   JANET DECESARE Date of Exam: 05/12/2020 Medical Rec #:  [id number]    Height: Accession #:    [id number]   Weight:       171.3 lb Date of Birth:  October 26, 1956    BSA:          1.821 m Patient Age:    64 years     BP:           133/70 mmHg Patient Gender: M            HR:           60 bpm. Exam Location:  Inpatient Procedure: 2D Echo Indications:    stroke 434.91  History:        Patient has no prior history of Echocardiogram examinations.  Sonographer:    Delcie Roch Referring Phys: [phone] ERIC LINDZEN IMPRESSIONS  1. Left ventricular ejection fraction, by estimation, is 60 to 65%. The left ventricle has normal function. The left ventricle has no regional wall motion abnormalities. There is mild concentric left ventricular hypertrophy. Left ventricular diastolic parameters are consistent with Grade I diastolic dysfunction (impaired relaxation).  2. Right ventricular systolic function is normal. The right ventricular size is normal. Tricuspid regurgitation signal is inadequate for assessing PA pressure.  3. The mitral valve is normal in structure. Trivial mitral valve regurgitation. No evidence of mitral stenosis.  4. The aortic valve is normal in structure. Aortic valve regurgitation is not visualized. No aortic stenosis is present.  5. The inferior vena cava is normal in size with greater than 50% respiratory variability, suggesting right atrial pressure of 3 mmHg. FINDINGS  Left Ventricle: Left ventricular ejection fraction, by estimation, is 60 to 65%. The left ventricle has normal function. The left ventricle has no regional wall motion abnormalities. The left ventricular internal cavity size was normal in size. There is  mild concentric left ventricular hypertrophy. Left ventricular diastolic parameters are consistent with Grade I diastolic dysfunction (impaired relaxation). Normal  left ventricular filling pressure. Right Ventricle: The right ventricular size is normal. No increase in right ventricular wall thickness. Right ventricular systolic function is normal. Tricuspid regurgitation signal is inadequate for assessing PA pressure. Left Atrium: Left atrial size was normal in size. Right Atrium: Right atrial size was normal in size. Pericardium: There is no evidence of pericardial effusion. Mitral Valve: The mitral valve is normal in structure. Normal mobility of the mitral valve leaflets. Trivial mitral valve regurgitation. No evidence of mitral valve stenosis. Tricuspid Valve: The tricuspid valve is normal in structure. Tricuspid valve regurgitation is trivial. No evidence of tricuspid stenosis. Aortic Valve: The aortic valve is normal in structure. Aortic valve regurgitation is not visualized. No aortic stenosis is present. Pulmonic Valve: The pulmonic valve was normal in structure. Pulmonic valve regurgitation is not visualized. No evidence of pulmonic stenosis. Aorta: The aortic root is normal in size and structure. Venous: The inferior vena cava was not well visualized. The inferior vena cava is normal in size with greater than 50% respiratory variability, suggesting right atrial pressure of 3 mmHg. IAS/Shunts: No atrial level shunt detected by color flow Doppler.  LEFT VENTRICLE PLAX 2D LVIDd:         4.20 cm  Diastology LVIDs:         2.80 cm  LV e' lateral:   8.49 cm/s LV PW:         1.30 cm  LV E/e' lateral: 8.1 LV IVS:  1.20 cm  LV e' medial:    6.31 cm/s LVOT diam:     1.90 cm  LV E/e' medial:  10.9 LV SV:         82 LV SV Index:   45 LVOT Area:     2.84 cm  RIGHT VENTRICLE RV S prime:     12.80 cm/s TAPSE (M-mode): 2.3 cm LEFT ATRIUM           Index       RIGHT ATRIUM           Index LA diam:      3.30 cm 1.81 cm/m  RA Area:     15.50 cm LA Vol (A2C): 61.3 ml 33.66 ml/m RA Volume:   36.60 ml  20.10 ml/m  AORTIC VALVE LVOT Vmax:   108.00 cm/s LVOT Vmean:  77.600 cm/s  LVOT VTI:    0.289 m  AORTA Ao Root diam: 3.30 cm MITRAL VALVE MV Area (PHT): 3.27 cm    SHUNTS MV Decel Time: 232 msec    Systemic VTI:  0.29 m MV E velocity: 69.00 cm/s  Systemic Diam: 1.90 cm MV A velocity: 98.10 cm/s MV E/A ratio:  0.70 Armanda Magicraci Turner MD Electronically signed by Armanda Magicraci Turner MD Signature Date/Time: 05/12/2020/4:28:50 PM    Final    CT HEAD CODE STROKE WO CONTRAST  Result Date: 05/12/2020 CLINICAL DATA:  Code stroke. Right-sided weakness and right facial droop. EXAM: CT HEAD WITHOUT CONTRAST TECHNIQUE: Contiguous axial images were obtained from the base of the skull through the vertex without intravenous contrast. COMPARISON:  None. FINDINGS: Brain: 1.6 by 1.7 x 2.4 cm in diameter intraparenchymal hematoma in the left thalamus with intraventricular penetration. Surrounding edema. No hydrocephalus. Chronic small-vessel ischemic changes affect the pons and cerebral hemispheric white matter elsewhere. Vascular: There is atherosclerotic calcification of the major vessels at the base of the brain. Skull: Negative Sinuses/Orbits: Inflammatory changes of the right division of the sphenoid sinus. Orbits negative. Other: None ASPECTS (Alberta Stroke Program Early CT Score) - Ganglionic level infarction (caudate, lentiform nuclei, internal capsule, insula, M1-M3 cortex): 7 - Supraganglionic infarction (M4-M6 cortex): 3 Total score (0-10 with 10 being normal): 10 IMPRESSION: 1. 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 Intraventricular penetration with blood in the left lateral ventricle. No ventricular obstruction. 2. ASPECTS is 10. 3. These results were communicated to Dr. Otelia LimesLindzen at 8:57 amon 6/5/2021by text page via the [facility] messaging system. Electronically Signed   By: Paulina FusiMark  Shogry M.D.   On: 05/12/2020 08:58    Time Spent in minutes  30   Susa RaringPrashant Jammy Plotkin M.D on 05/30/2020 at 9:12 AM  To page go to  [email or web address] - password [email or web address]

## 2020-05-30 NOTE — Progress Notes (Signed)
PT Cancellation and discharge Note  Patient Details Name: Terry Barton MRN: [id number] DOB: 10/29/1956   Cancelled Treatment:    Reason Eval/Treat Not Completed: PT screened, no needs identified, will sign off  Chart reviewed and noted pt is total care at home since 2016. Noted during last hospitalization pt was evaluated by PT 05/16/20 with PT recommending SNF and family preferred home. Son and brother care for him.  No new needs identified. Patient sleeping soundly and discussed with RN and agreed no need for PT evaluation.   Jerolyn Center, PT Pager [phone]   Zena Amos 05/30/2020, 11:56 AM

## 2020-05-31 LAB — CBC WITH DIFFERENTIAL/PLATELET
Abs Immature Granulocytes: 0.17 10*3/uL — ABNORMAL HIGH (ref 0.00–0.07)
Basophils Absolute: 0 10*3/uL (ref 0.0–0.1)
Basophils Relative: 0 %
Eosinophils Absolute: 0 10*3/uL (ref 0.0–0.5)
Eosinophils Relative: 0 %
HCT: 33.7 % — ABNORMAL LOW (ref 39.0–52.0)
Hemoglobin: 11 g/dL — ABNORMAL LOW (ref 13.0–17.0)
Immature Granulocytes: 2 %
Lymphocytes Relative: 17 %
Lymphs Abs: 1.9 10*3/uL (ref 0.7–4.0)
MCH: 30.5 pg (ref 26.0–34.0)
MCHC: 32.6 g/dL (ref 30.0–36.0)
MCV: 93.4 fL (ref 80.0–100.0)
Monocytes Absolute: 0.9 10*3/uL (ref 0.1–1.0)
Monocytes Relative: 8 %
Neutro Abs: 8 10*3/uL — ABNORMAL HIGH (ref 1.7–7.7)
Neutrophils Relative %: 73 %
Platelets: 385 10*3/uL (ref 150–400)
RBC: 3.61 MIL/uL — ABNORMAL LOW (ref 4.22–5.81)
RDW: 12.6 % (ref 11.5–15.5)
WBC: 10.9 10*3/uL — ABNORMAL HIGH (ref 4.0–10.5)
nRBC: 0 % (ref 0.0–0.2)

## 2020-05-31 LAB — COMPREHENSIVE METABOLIC PANEL
ALT: 27 U/L (ref 0–44)
AST: 20 U/L (ref 15–41)
Albumin: 2.6 g/dL — ABNORMAL LOW (ref 3.5–5.0)
Alkaline Phosphatase: 94 U/L (ref 38–126)
Anion gap: 11 (ref 5–15)
BUN: 37 mg/dL — ABNORMAL HIGH (ref 8–23)
CO2: 19 mmol/L — ABNORMAL LOW (ref 22–32)
Calcium: 9.1 mg/dL (ref 8.9–10.3)
Chloride: 103 mmol/L (ref 98–111)
Creatinine, Ser: 2.19 mg/dL — ABNORMAL HIGH (ref 0.61–1.24)
GFR calc Af Amer: 35 mL/min — ABNORMAL LOW (ref >60)
GFR calc non Af Amer: 30 mL/min — ABNORMAL LOW (ref >60)
Glucose, Bld: 242 mg/dL — ABNORMAL HIGH (ref 70–99)
Potassium: 4.3 mmol/L (ref 3.5–5.1)
Sodium: 133 mmol/L — ABNORMAL LOW (ref 135–145)
Total Bilirubin: 0.6 mg/dL (ref 0.3–1.2)
Total Protein: 7.3 g/dL (ref 6.5–8.1)

## 2020-05-31 LAB — C-REACTIVE PROTEIN: CRP: 6.5 mg/dL — ABNORMAL HIGH (ref <1.0)

## 2020-05-31 LAB — MAGNESIUM: Magnesium: 1.4 mg/dL — ABNORMAL LOW (ref 1.7–2.4)

## 2020-05-31 LAB — BRAIN NATRIURETIC PEPTIDE: B Natriuretic Peptide: 126.4 pg/mL — ABNORMAL HIGH (ref 0.0–100.0)

## 2020-05-31 LAB — GLUCOSE, CAPILLARY
Glucose-Capillary: 211 mg/dL — ABNORMAL HIGH (ref 70–99)
Glucose-Capillary: 218 mg/dL — ABNORMAL HIGH (ref 70–99)
Glucose-Capillary: 312 mg/dL — ABNORMAL HIGH (ref 70–99)

## 2020-05-31 MED ORDER — LACTATED RINGERS IV SOLN: INTRAVENOUS | Status: AC

## 2020-05-31 MED ORDER — MAGNESIUM SULFATE IN D5W 1-5 GM/100ML-% IV SOLN
1.0000 g | Freq: Once | INTRAVENOUS | Status: AC
  Administered 2020-05-31: 1 g via INTRAVENOUS
  Filled 2020-05-31: qty 100

## 2020-05-31 MED ORDER — MAGNESIUM SULFATE 2 GM/50ML IV SOLN
2.0000 g | Freq: Once | INTRAVENOUS | Status: AC
  Administered 2020-05-31: 2 g via INTRAVENOUS
  Filled 2020-05-31: qty 50

## 2020-05-31 NOTE — Plan of Care (Signed)
Patient A&O x1-2, easily reorient. VSS. Free from falls. Patient turned in bed every two hours.  Pt tolerating fluids and meals. Pt voiding adequately during shift via male primafit.DVT prophylactic: SCDs. No c/o of pain. Bed wheels locked. Phone and call bell within reach. Pt is resting, no distress. POC reviewed with son.  Problem: Education: Goal: Knowledge of General Education information will improve Description: Including pain rating scale, medication(s)/side effects and non-pharmacologic comfort measures Outcome: Progressing   Problem: Health Behavior/Discharge Planning: Goal: Ability to manage health-related needs will improve Outcome: Progressing   Problem: Clinical Measurements: Goal: Ability to maintain clinical measurements within normal limits will improve Outcome: Progressing Goal: Will remain free from infection Outcome: Progressing Goal: Diagnostic test results will improve Outcome: Progressing Goal: Respiratory complications will improve Outcome: Progressing Goal: Cardiovascular complication will be avoided Outcome: Progressing   Problem: Activity: Goal: Risk for activity intolerance will decrease Outcome: Progressing   Problem: Nutrition: Goal: Adequate nutrition will be maintained Outcome: Progressing   Problem: Coping: Goal: Level of anxiety will decrease Outcome: Progressing   Problem: Elimination: Goal: Will not experience complications related to bowel motility Outcome: Progressing Goal: Will not experience complications related to urinary retention Outcome: Progressing   Problem: Skin Integrity: Goal: Risk for impaired skin integrity will decrease Outcome: Progressing   Problem: Education: Goal: Knowledge of disease and its progression will improve Outcome: Progressing Goal: Individualized Educational Video(s) Outcome: Progressing   Problem: Fluid Volume: Goal: Compliance with measures to maintain balanced fluid volume will improve Outcome:  Progressing   Problem: Health Behavior/Discharge Planning: Goal: Ability to manage health-related needs will improve Outcome: Progressing   Problem: Nutritional: Goal: Ability to make healthy dietary choices will improve Outcome: Progressing   Problem: Clinical Measurements: Goal: Complications related to the disease process, condition or treatment will be avoided or minimized Outcome: Progressing

## 2020-05-31 NOTE — Progress Notes (Signed)
  Speech Language Pathology Treatment: Dysphagia  Patient Details Name: Terry Barton MRN: [id number] DOB: 08/14/1956 Today's Date: 05/31/2020 Time: 1040-1056 SLP Time Calculation (min) (ACUTE ONLY): 16 min  Assessment / Plan / Recommendation Clinical Impression  Pt finishing with OT when SLP arrived and set up with breakfast tray. He has a tendency to take large bites with prolonged mastication and appears to pocket. When examined he did have residual panckes on left side of mouth but less than expected. Respiratory and deglutory systems intact without s/s aspiration. He was in agreement to continue Dys 3 (mechanical soft) when he discharges to rehab at [facility]. Noticed right effected arm drawn to chest and spastic. He was able to extend somewhat and therapist advised him to pay attention to arm/hand positioning periodically and reposition as needed (deferring to the OT for all extremity/motorical needs with UE).  No further ST needed in acute care venue.    HPI HPI:  Terry Barton is a 64 y.o. male with history of recent admission for intracranial bleed and discharged to home 6/9. Admitted with decreased alertness and mild confusion. 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 CXR no acute cardiopulmonary disease. CT Interval decrease in size of patient's acute hemorrhage over the left thalamus now measuring 1 x 1 x 1.6 cm (previously 1.6 x 1.7 x 2.4 cm). Stable mild adjacent edema. Interval improvement and      SLP Plan  All goals met;Discharge SLP treatment due to (comment)       Recommendations  Diet recommendations: Dysphagia 3 (mechanical soft);Thin liquid Liquids provided via: Cup;Straw Medication Administration: Whole meds with liquid Supervision: Patient able to self feed;Staff to assist with self feeding Compensations: Slow rate;Small sips/bites;Clear throat intermittently Postural Changes and/or Swallow Maneuvers: Seated upright 90 degrees                Oral Care  Recommendations: Oral care BID Follow up Recommendations: Skilled Nursing facility SLP Visit Diagnosis: Dysphagia, unspecified (R13.10) Plan: All goals met;Discharge SLP treatment due to (comment)                       Houston Siren 05/31/2020, 11:09 AM   Orbie Pyo Colvin Caroli.Ed Risk analyst [phone] Office [phone]

## 2020-05-31 NOTE — Progress Notes (Signed)
PROGRESS NOTE                                                                                                                                                                                                             Patient Demographics:    Terry Barton, is a 64 y.o. male, DOB - 28-Feb-1956, ZOX:[id number]  Admit date - 05/27/2020   Admitting Physician Eduard Clos, MD  Outpatient Primary MD for the patient is Patient, No Pcp Per  LOS - 4  Chief Complaint  Patient presents with  . 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  He was now brought to the hospital for increased confusion and lack of responsiveness, was diagnosed with UTI and dehydration and admitted.   Subjective:   Patient in bed, appears comfortable, denies any headache, no fever, no chest pain or pressure, no shortness of breath , no abdominal pain. No focal weakness.   Assessment  & Plan :     1.  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  Discussed with son, goal of care is medical treatment, he is DNR, if declines focus on comfort.  Clinically he has improved.   2.  Dysphagia.  Due to #1 above.  Speech  following currently on dysphagia 3 diet.  3.  Hypertension.  Continue Norvasc, metoprolol with as needed hydralazine and monitor.  4.  Anemia of chronic disease.  Stable.  5.  AKI on CKD 3 with baseline creatinine is between 1.6-2.  AKI due to dehydration much improved with IV fluids continue monitoring.     Condition - Extremely Guarded  Family Communication  :  Son Terry Barton on his cell phone  [phone] on 05/30/2020    Code Status :  DNR  Consults  : None  Procedures  :    CT head  X 2 - 1. Unchanged size of left thalamic intraparenchymal hematoma with mild surrounding edema. 2. Unchanged small amount of blood layering in both occipital horns. 3. No new site of hemorrhage  PUD Prophylaxis : PPI  Disposition Plan  :    Status is: Inpatient  Remains inpatient appropriate because:IV treatments appropriate due to intensity of illness or inability to take PO   Dispo: The patient is from: Home              Anticipated d/c is to: Home              Anticipated d/c date is: > 3 days              Patient currently is not medically stable to d/c.  DVT Prophylaxis  :   SCDs   Lab Results  Component Value Date   PLT 385 05/31/2020    Diet :  Diet Order            DIET DYS 3 Room service appropriate? No; Fluid consistency: Thin  Diet effective now                  Inpatient Medications  Scheduled Meds: . amLODipine  5 mg Oral Daily  . ascorbic acid  500 mg Oral Daily  . cyproheptadine  4 mg Oral TID  . dexamethasone (DECADRON) injection  6 mg Intravenous Q24H  . ferrous sulfate  325 mg Oral TID WC  . mouth rinse  15 mL Mouth Rinse BID  . metoprolol tartrate  25 mg Oral BID  . pantoprazole  40 mg Oral Daily   Continuous Infusions: . cefTRIAXone (ROCEPHIN)  IV 1 g (05/31/20 0959)  . magnesium sulfate bolus IVPB 2 g (05/31/20 0954)  . sodium chloride     PRN Meds:.hydrALAZINE, [DISCONTINUED] ondansetron **OR** ondansetron (ZOFRAN) IV  Antibiotics  :     Anti-infectives (From admission, onward)   Start     Dose/Rate Route Frequency Ordered Stop   05/30/20 0800  cefTRIAXone (ROCEPHIN) 1 g in sodium chloride 0.9 % 100 mL IVPB     Discontinue     1 g 200 mL/hr over 30 Minutes Intravenous Daily 05/30/20 0723 06/04/20 0959         Objective:   Vitals:   05/30/20 2000 05/31/20 0009 05/31/20 0400 05/31/20 0951  BP: (!) 143/80 (!) 141/64 (!) 149/71 132/64  Pulse: 75 63 68 64  Resp: 19 18 15    Temp: 98.3 F (36.8 C) 97.6 F (36.4 C) 97.7 F (36.5 C)   TempSrc: Axillary Oral Oral   SpO2: 100% 100% 100%   Weight:      Height:        SpO2: 100 %  Wt Readings from Last 3 Encounters:  05/29/20 79.2 kg  05/12/20 77.7 kg     Intake/Output Summary (Last 24 hours) at 05/31/2020 1003 Last data filed at 05/31/2020 0603 Gross per 24 hour  Intake 2545.95 ml  Output 1050 ml  Net 1495.95 ml     Physical Exam  Awake more alert this morning, right-sided facial droop, 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  Symmetrical Chest wall movement, Good air movement bilaterally, CTAB  RRR,No Gallops, Rubs or new Murmurs, No Parasternal Heave +ve B.Sounds, Abd Soft, No tenderness, No organomegaly appriciated, No rebound - guarding or rigidity. No Cyanosis, Clubbing or edema, No new Rash or bruise     Data Review:    Recent Labs  Lab 05/27/20 2059 05/27/20 2217 05/28/20 0755 05/30/20 0324 05/31/20 0358  WBC  --  8.5 12.5* 9.6 10.9*  HGB 11.9* 12.3* 12.6* 10.2* 11.0*  HCT 35.0* 38.7* 40.2 31.1* 33.7*  PLT  --  390 413* 383 385  MCV  --  98.0 98.3 94.2 93.4  MCH  --  31.1 30.8 30.9 30.5  MCHC  --  31.8 31.3 32.8 32.6  RDW  --  12.8 12.9 12.8 12.6  LYMPHSABS  --  1.8 2.0 2.2 1.9  MONOABS  --  0.8 2.1* 1.3* 0.9  EOSABS  --  0.1 0.1 0.1 0.0  BASOSABS   --  0.0 0.1 0.0 0.0    Recent Labs  Lab 05/27/20 1815 05/27/20 2059 05/27/20 2217 05/28/20 0755 05/28/20 0806 05/29/20 1010 05/30/20 0324 05/30/20 0753 05/31/20 0358  NA  --    < > 134* 137  --  136 132*  --  133*  K  --    < > 3.3* 3.8  --  3.9 3.5  --  4.3  CL  --   --  99 103  --  104 102  --  103  CO2  --   --  25 21*  --  21* 19*  --  19*  GLUCOSE  --   --  159* 121*  --  149* 154*  --  242*  BUN  --   --  29* 32*  --  30* 32*  --  37*  CREATININE  --   --  2.86* 2.64*  --  2.24* 2.27*  --  2.19*  CALCIUM  --   --  9.3 9.3  --  8.9 8.7*  --  9.1  AST  --   --  23 20  18   --   --  20  --  20  ALT  --   --  47* 39  39  --   --  26  --  27  ALKPHOS  --   --  143* 132*  133*  --   --  97  --  94  BILITOT  --   --  0.9 0.6  0.7  --   --  0.7  --  0.6  ALBUMIN  --   --  3.1* 3.1*  3.2*  --   --  2.6*  --  2.6*  MG  --   --   --  1.6*  --   --   --  1.4* 1.4*  CRP  --   --   --   --   --   --   --   --  6.5*  INR 1.1  --   --   --   --   --   --   --   --   TSH  --   --   --  1.767  --   --   --   --   --   AMMONIA  --   --   --   --  22  --   --   --   --   BNP  --   --   --   --   --   --   --   --  126.4*   < > = values in this interval not displayed.    Recent Labs  Lab 05/27/20 1538 05/31/20 0358  CRP  --  6.5*  BNP  --  126.4*  SARSCOV2NAA NEGATIVE  --     ------------------------------------------------------------------------------------------------------------------ No results for input(s): CHOL, HDL, LDLCALC, TRIG, CHOLHDL, LDLDIRECT in the last 72 hours.  Lab Results  Component Value Date   HGBA1C 5.2 05/12/2020   ------------------------------------------------------------------------------------------------------------------ No results for input(s): TSH, T4TOTAL, T3FREE, THYROIDAB in the last 72 hours.  Invalid input(s): FREET3 ------------------------------------------------------------------------------------------------------------------ No  results for input(s): VITAMINB12, FOLATE, FERRITIN, TIBC, IRON, RETICCTPCT in the last 72 hours.  Coagulation profile Recent Labs  Lab 05/27/20 1815  INR 1.1    No results for input(s): DDIMER in the last 72 hours.  Cardiac Enzymes No results for input(s): CKMB, TROPONINI, MYOGLOBIN in the last 168 hours.  Invalid input(s): CK ------------------------------------------------------------------------------------------------------------------    Component Value Date/Time   BNP 126.4 (H) 05/31/2020 0358    Micro Results Recent Results (from the past 240 hour(s))  SARS Coronavirus 2 by RT PCR (hospital order, performed in [facility] hospital lab) Nasopharyngeal Nasopharyngeal Swab     Status: None   Collection Time: 05/27/20  3:38 PM   Specimen: Nasopharyngeal Swab  Result Value Ref Range Status   SARS Coronavirus 2 NEGATIVE NEGATIVE Final    Comment: (NOTE) SARS-CoV-2 target nucleic acids are NOT DETECTED.  The SARS-CoV-2 RNA is generally detectable in upper and lower respiratory specimens during the acute phase of infection. The lowest concentration of SARS-CoV-2 viral copies this assay can detect is 250 copies / mL. A negative result does not preclude SARS-CoV-2 infection and should not be used as the sole basis for treatment or other patient management decisions.  A negative result may occur with improper specimen collection / handling, submission of specimen other than nasopharyngeal swab, presence of viral mutation(s) within the areas targeted by this assay, and inadequate number of viral copies (<250 copies / mL). A negative result must be combined with clinical observations, patient history, and epidemiological information.  Fact Sheet for Patients:   BoilerBrush.com.cy  Fact Sheet for Healthcare Providers: https://pope.com/  This test is not yet approved or  cleared by the [place] FDA and has been authorized  for detection and/or diagnosis of SARS-CoV-2 by FDA under an Emergency Use Authorization (EUA).  This EUA will remain in effect (meaning this test can be used) for the duration of the COVID-19 declaration under Section 564(b)(1) of the Act, 21 U.S.C. section 360bbb-3(b)(1), unless the authorization is terminated or revoked sooner.  Performed at [facility], [address], [place], [place] [address]   Culture, blood (routine x 2)     Status: None (Preliminary result)   Collection Time: 05/27/20  6:15 PM   Specimen: BLOOD  Result Value Ref Range Status   Specimen Description BLOOD SITE NOT SPECIFIED  Final   Special Requests   Final    BOTTLES DRAWN AEROBIC AND ANAEROBIC Blood Culture adequate volume   Culture   Final    NO GROWTH 4 DAYS Performed at [facility], [address] [place], [place] [address]    Report Status PENDING  Incomplete  Culture, blood (routine x 2)     Status: None (Preliminary result)   Collection Time: 05/27/20 10:16 PM   Specimen: BLOOD  Result Value Ref Range Status   Specimen Description BLOOD LEFT UPPER ARM  Final   Special Requests   Final  BOTTLES DRAWN AEROBIC AND ANAEROBIC Blood Culture adequate volume   Culture   Final    NO GROWTH 4 DAYS Performed at [facility], [address], [place], [place] [address]    Report Status PENDING  Incomplete  Culture, Urine     Status: Abnormal (Preliminary result)   Collection Time: 05/30/20  7:24 AM   Specimen: Urine, Clean Catch  Result Value Ref Range Status   Specimen Description URINE, CLEAN CATCH  Final   Special Requests   Final    NONE Performed at [facility], [address], [place], [place] [address]    Culture 70,000 COLONIES/mL GRAM NEGATIVE RODS (A)  Final   Report Status PENDING  Incomplete    Radiology Reports Chest 2 View  Result Date: 05/12/2020 CLINICAL DATA:  New onset slurred speech and right arm and right leg weakness. EXAM: CHEST - 2 VIEW  COMPARISON:  None. FINDINGS: There is right-sided opacity extending from the mid lung to the right lung base, obscuring the right hemidiaphragm and portions of the right heart border. In part this is due to a moderate pleural effusion. There is associated parenchymal opacity that is consistent with atelectasis or infection. Remainder of the lungs is clear. No left pleural effusion. No pneumothorax. Cardiac silhouette is top-normal in size. No mediastinal or hilar masses or convincing adenopathy. Skeletal structures are grossly intact. IMPRESSION: 1. Moderate right-sided pleural effusion associated with right lung base parenchymal opacity, the latter consistent with atelectasis or pneumonia. 2. No other evidence of acute cardiopulmonary disease. No pulmonary edema. Electronically Signed   By: Amie Portland M.D.   On: 05/12/2020 11:08   CT HEAD WO CONTRAST  Result Date: 05/29/2020 CLINICAL DATA:  Stroke follow-up EXAM: CT HEAD WITHOUT CONTRAST TECHNIQUE: Contiguous axial images were obtained from the base of the skull through the vertex without intravenous contrast. COMPARISON:  Head CT 05/27/2020 FINDINGS: Brain: Unchanged size of left thalamic intraparenchymal hematoma mild surrounding edema. Small amount of blood layering in both occipital horns is unchanged. No new site of hemorrhage. Size and configuration of the ventricles are unchanged. There is generalized volume loss. Vascular: No abnormal hyperdensity of the major intracranial arteries or dural venous sinuses. No intracranial atherosclerosis. Skull: The visualized skull base, calvarium and extracranial soft tissues are normal. Sinuses/Orbits: Small amount of fluid in the sphenoid sinus. The orbits are normal. IMPRESSION: 1. Unchanged size of left thalamic intraparenchymal hematoma with mild surrounding edema. 2. Unchanged small amount of blood layering in both occipital horns. 3. No new site of hemorrhage. Electronically Signed   By: Deatra Robinson M.D.    On: 05/29/2020 21:14   CT Head Wo Contrast  Result Date: 05/27/2020 CLINICAL DATA:  Encephalopathy.  Follow-up left thalamic hemorrhage. EXAM: CT HEAD WITHOUT CONTRAST TECHNIQUE: Contiguous axial images were obtained from the base of the skull through the vertex without intravenous contrast. COMPARISON:  05/12/2020 FINDINGS: Brain: Examination demonstrates interval decrease in size of patient's acute focal hemorrhage over the region of the left thalamus now measuring 1 x 1 x 1.6 cm (previously 1.6 x 1.7 x 2.4 cm). Persistent mild adjacent edema. Interval improvement with tiny amount of residual intraventricular hemorrhage over the dependent portion of the occipital horns. No new areas of hemorrhage. Ventricles, cisterns and other CSF spaces otherwise unchanged. No evidence of mass or mass effect. No midline shift. Evidence of chronic ischemic microvascular disease. Vascular: No hyperdense vessel or unexpected calcification. Skull: Normal. Negative for fracture or focal lesion.  Sinuses/Orbits: Orbits are normal. Paranasal sinuses are well developed with small air-fluid levels over the sphenoid sinuses. Other: None. IMPRESSION: 1. Interval decrease in size of patient's acute hemorrhage over the left thalamus now measuring 1 x 1 x 1.6 cm (previously 1.6 x 1.7 x 2.4 cm). Stable mild adjacent edema. Interval improvement and associated intraventricular hemorrhage with only minimal residual hemorrhage over the occipital horns. No new areas of hemorrhage. 2.  Mild chronic ischemic microvascular disease. Electronically Signed   By: Elberta Fortis M.D.   On: 05/27/2020 16:37   MR ANGIO HEAD WO CONTRAST  Result Date: 05/13/2020 CLINICAL DATA:  Stroke, follow-up. EXAM: MRI HEAD WITHOUT CONTRAST MRA HEAD WITHOUT CONTRAST TECHNIQUE: Multiplanar, multiecho pulse sequences of the brain and surrounding structures were obtained without intravenous contrast. Angiographic images of the head were obtained using MRA technique  without contrast. COMPARISON:  Noncontrast head CT performed earlier the same day 05/12/2020 FINDINGS: MRI HEAD FINDINGS Brain: Mild intermittent motion degradation. An acute parenchymal hemorrhage centered within the left thalamus has not significantly changed in size, measuring 1.7 x 1.8 cm in transaxial dimensions. Similar appearance of surrounding edema. There is no significant ventricular effacement. No midline shift. Intraventricular extension into the left lateral ventricle does not appear significantly changed. However, there is a new small amount of hemorrhage within the right occipital horn. No hydrocephalus at this time. Mild patchy T2/FLAIR hyperintensity within the cerebral white matter is nonspecific, but consistent with chronic small vessel ischemic disease. Redemonstrated mild generalized parenchymal atrophy. No evidence of intracranial mass. No extra-axial fluid collection. Vascular: Reported below. Skull and upper cervical spine: No focal marrow lesion. Sinuses/Orbits: Visualized orbits show no acute finding. Mild ethmoid, sphenoid and maxillary sinus mucosal thickening. Trace bilateral mastoid effusions. MRA HEAD FINDINGS The examination is mild to moderately motion degraded, which limits evaluation for stenoses and for small aneurysms. The intracranial internal carotid arteries are patent. There is an apparent severe stenosis within the right ICA at the level of the skull base, which may be accentuated by artifact (series 1030, image 7). No other significant stenosis is identified within the internal carotid arteries. The M1 middle cerebral arteries are patent without significant stenosis. There is an attenuated appearance of multiple mid M2 and more distal right MCA branch vessels. Additionally, there is an attenuated appearance of multiple distal M2 and more distal left MCA branch vessels. Findings may be related to motion artifact. High-grade stenoses within these vessels cannot be excluded.  The anterior cerebral arteries are patent without significant proximal stenosis. No intracranial aneurysm is identified. The intracranial vertebral arteries are patent bilaterally with the right being slightly dominant. The basilar artery is patent without significant stenosis. The posterior cerebral arteries are patent proximally without significant stenosis. Attenuated appear of the P3 and more distal PCA branches bilaterally which may reflect motion artifact. Stay high-grade stenoses within these vessels cannot be excluded. IMPRESSION: MRI brain: 1. Unchanged size of an acute left thalamic parenchymal hemorrhage. Surrounding edema has also not significantly changed. No significant ventricular effacement. No midline shift. 2. Similar appearance of ventricular extension into the left lateral ventricle. New from prior examination, there is a small amount of hemorrhage within the right occipital horn. No evidence of hydrocephalus at this time. 3. Mild generalized parenchymal atrophy and chronic small vessel ischemic disease. 4. Mild ethmoid sinus mucosal thickening. 5. Trace bilateral mastoid effusions. MRA head: 1. Mild-to-moderate motion degradation limits evaluation. 2. No intracranial aneurysm is identified. 3. Apparent severe stenosis within the right ICA at  the level of the skull base, which may be accentuated by susceptibility artifact at this level. 4. Attenuated appearance of multiple mid M2 and more distal right MCA branch vessels. Additionally, there is an attenuated appearance of multiple distal M2 and more distal left MCA branch vessels. Findings may be related to motion artifact. High-grade stenoses within these vessels cannot be excluded. 5. Attenuated appearance of the P3 and more distal PCA branches bilaterally. These findings may also reflect motion artifact. High-grade stenoses within these vessels cannot be excluded. Electronically Signed   By: Jackey Loge DO   On: 05/13/2020 14:45   MR BRAIN  WO CONTRAST  Result Date: 05/13/2020 CLINICAL DATA:  Stroke, follow-up. EXAM: MRI HEAD WITHOUT CONTRAST MRA HEAD WITHOUT CONTRAST TECHNIQUE: Multiplanar, multiecho pulse sequences of the brain and surrounding structures were obtained without intravenous contrast. Angiographic images of the head were obtained using MRA technique without contrast. COMPARISON:  Noncontrast head CT performed earlier the same day 05/12/2020 FINDINGS: MRI HEAD FINDINGS Brain: Mild intermittent motion degradation. An acute parenchymal hemorrhage centered within the left thalamus has not significantly changed in size, measuring 1.7 x 1.8 cm in transaxial dimensions. Similar appearance of surrounding edema. There is no significant ventricular effacement. No midline shift. Intraventricular extension into the left lateral ventricle does not appear significantly changed. However, there is a new small amount of hemorrhage within the right occipital horn. No hydrocephalus at this time. Mild patchy T2/FLAIR hyperintensity within the cerebral white matter is nonspecific, but consistent with chronic small vessel ischemic disease. Redemonstrated mild generalized parenchymal atrophy. No evidence of intracranial mass. No extra-axial fluid collection. Vascular: Reported below. Skull and upper cervical spine: No focal marrow lesion. Sinuses/Orbits: Visualized orbits show no acute finding. Mild ethmoid, sphenoid and maxillary sinus mucosal thickening. Trace bilateral mastoid effusions. MRA HEAD FINDINGS The examination is mild to moderately motion degraded, which limits evaluation for stenoses and for small aneurysms. The intracranial internal carotid arteries are patent. There is an apparent severe stenosis within the right ICA at the level of the skull base, which may be accentuated by artifact (series 1030, image 7). No other significant stenosis is identified within the internal carotid arteries. The M1 middle cerebral arteries are patent without  significant stenosis. There is an attenuated appearance of multiple mid M2 and more distal right MCA branch vessels. Additionally, there is an attenuated appearance of multiple distal M2 and more distal left MCA branch vessels. Findings may be related to motion artifact. High-grade stenoses within these vessels cannot be excluded. The anterior cerebral arteries are patent without significant proximal stenosis. No intracranial aneurysm is identified. The intracranial vertebral arteries are patent bilaterally with the right being slightly dominant. The basilar artery is patent without significant stenosis. The posterior cerebral arteries are patent proximally without significant stenosis. Attenuated appear of the P3 and more distal PCA branches bilaterally which may reflect motion artifact. Stay high-grade stenoses within these vessels cannot be excluded. IMPRESSION: MRI brain: 1. Unchanged size of an acute left thalamic parenchymal hemorrhage. Surrounding edema has also not significantly changed. No significant ventricular effacement. No midline shift. 2. Similar appearance of ventricular extension into the left lateral ventricle. New from prior examination, there is a small amount of hemorrhage within the right occipital horn. No evidence of hydrocephalus at this time. 3. Mild generalized parenchymal atrophy and chronic small vessel ischemic disease. 4. Mild ethmoid sinus mucosal thickening. 5. Trace bilateral mastoid effusions. MRA head: 1. Mild-to-moderate motion degradation limits evaluation. 2. No intracranial aneurysm is identified.  3. Apparent severe stenosis within the right ICA at the level of the skull base, which may be accentuated by susceptibility artifact at this level. 4. Attenuated appearance of multiple mid M2 and more distal right MCA branch vessels. Additionally, there is an attenuated appearance of multiple distal M2 and more distal left MCA branch vessels. Findings may be related to motion  artifact. High-grade stenoses within these vessels cannot be excluded. 5. Attenuated appearance of the P3 and more distal PCA branches bilaterally. These findings may also reflect motion artifact. High-grade stenoses within these vessels cannot be excluded. Electronically Signed   By: Jackey LogeKyle  Golden DO   On: 05/13/2020 14:45   DG Chest Port 1 View  Result Date: 05/27/2020 CLINICAL DATA:  Altered mental status.  Weakness. EXAM: PORTABLE CHEST 1 VIEW COMPARISON:  05/12/2020 FINDINGS: Lungs are adequately inflated without focal airspace consolidation, effusion or pneumothorax. Interval resolution of the previously seen moderate right effusion. Cardiomediastinal silhouette and remainder of the exam is unchanged. IMPRESSION: No acute cardiopulmonary disease. Electronically Signed   By: Elberta Fortisaniel  Boyle M.D.   On: 05/27/2020 16:30   ECHOCARDIOGRAM COMPLETE  Result Date: 05/12/2020    ECHOCARDIOGRAM REPORT   Patient Name:   Sharmon RevereDONALD Gerstel Date of Exam: 05/12/2020 Medical Rec #:  [id number]    Height: Accession #:    [id number]   Weight:       171.3 lb Date of Birth:  May 31, 1956    BSA:          1.821 m Patient Age:    64 years     BP:           133/70 mmHg Patient Gender: M            HR:           60 bpm. Exam Location:  Inpatient Procedure: 2D Echo Indications:    stroke 434.91  History:        Patient has no prior history of Echocardiogram examinations.  Sonographer:    Delcie RochLauren Pennington Referring Phys: [phone] ERIC LINDZEN IMPRESSIONS  1. Left ventricular ejection fraction, by estimation, is 60 to 65%. The left ventricle has normal function. The left ventricle has no regional wall motion abnormalities. There is mild concentric left ventricular hypertrophy. Left ventricular diastolic parameters are consistent with Grade I diastolic dysfunction (impaired relaxation).  2. Right ventricular systolic function is normal. The right ventricular size is normal. Tricuspid regurgitation signal is inadequate for assessing PA pressure.   3. The mitral valve is normal in structure. Trivial mitral valve regurgitation. No evidence of mitral stenosis.  4. The aortic valve is normal in structure. Aortic valve regurgitation is not visualized. No aortic stenosis is present.  5. The inferior vena cava is normal in size with greater than 50% respiratory variability, suggesting right atrial pressure of 3 mmHg. FINDINGS  Left Ventricle: Left ventricular ejection fraction, by estimation, is 60 to 65%. The left ventricle has normal function. The left ventricle has no regional wall motion abnormalities. The left ventricular internal cavity size was normal in size. There is  mild concentric left ventricular hypertrophy. Left ventricular diastolic parameters are consistent with Grade I diastolic dysfunction (impaired relaxation). Normal left ventricular filling pressure. Right Ventricle: The right ventricular size is normal. No increase in right ventricular wall thickness. Right ventricular systolic function is normal. Tricuspid regurgitation signal is inadequate for assessing PA pressure. Left Atrium: Left atrial size was normal in size. Right Atrium: Right atrial size was normal in size. Pericardium:  There is no evidence of pericardial effusion. Mitral Valve: The mitral valve is normal in structure. Normal mobility of the mitral valve leaflets. Trivial mitral valve regurgitation. No evidence of mitral valve stenosis. Tricuspid Valve: The tricuspid valve is normal in structure. Tricuspid valve regurgitation is trivial. No evidence of tricuspid stenosis. Aortic Valve: The aortic valve is normal in structure. Aortic valve regurgitation is not visualized. No aortic stenosis is present. Pulmonic Valve: The pulmonic valve was normal in structure. Pulmonic valve regurgitation is not visualized. No evidence of pulmonic stenosis. Aorta: The aortic root is normal in size and structure. Venous: The inferior vena cava was not well visualized. The inferior vena cava is normal  in size with greater than 50% respiratory variability, suggesting right atrial pressure of 3 mmHg. IAS/Shunts: No atrial level shunt detected by color flow Doppler.  LEFT VENTRICLE PLAX 2D LVIDd:         4.20 cm  Diastology LVIDs:         2.80 cm  LV e' lateral:   8.49 cm/s LV PW:         1.30 cm  LV E/e' lateral: 8.1 LV IVS:        1.20 cm  LV e' medial:    6.31 cm/s LVOT diam:     1.90 cm  LV E/e' medial:  10.9 LV SV:         82 LV SV Index:   45 LVOT Area:     2.84 cm  RIGHT VENTRICLE RV S prime:     12.80 cm/s TAPSE (M-mode): 2.3 cm LEFT ATRIUM           Index       RIGHT ATRIUM           Index LA diam:      3.30 cm 1.81 cm/m  RA Area:     15.50 cm LA Vol (A2C): 61.3 ml 33.66 ml/m RA Volume:   36.60 ml  20.10 ml/m  AORTIC VALVE LVOT Vmax:   108.00 cm/s LVOT Vmean:  77.600 cm/s LVOT VTI:    0.289 m  AORTA Ao Root diam: 3.30 cm MITRAL VALVE MV Area (PHT): 3.27 cm    SHUNTS MV Decel Time: 232 msec    Systemic VTI:  0.29 m MV E velocity: 69.00 cm/s  Systemic Diam: 1.90 cm MV A velocity: 98.10 cm/s MV E/A ratio:  0.70 Armanda Magic MD Electronically signed by Armanda Magic MD Signature Date/Time: 05/12/2020/4:28:50 PM    Final    CT HEAD CODE STROKE WO CONTRAST  Result Date: 05/12/2020 CLINICAL DATA:  Code stroke. Right-sided weakness and right facial droop. EXAM: CT HEAD WITHOUT CONTRAST TECHNIQUE: Contiguous axial images were obtained from the base of the skull through the vertex without intravenous contrast. COMPARISON:  None. FINDINGS: Brain: 1.6 by 1.7 x 2.4 cm in diameter intraparenchymal hematoma in the left thalamus with intraventricular penetration. Surrounding edema. No hydrocephalus. Chronic small-vessel ischemic changes affect the pons and cerebral hemispheric white matter elsewhere. Vascular: There is atherosclerotic calcification of the major vessels at the base of the brain. Skull: Negative Sinuses/Orbits: Inflammatory changes of the right division of the sphenoid sinus. Orbits negative. Other:  None ASPECTS (Alberta Stroke Program Early CT Score) - Ganglionic level infarction (caudate, lentiform nuclei, internal capsule, insula, M1-M3 cortex): 7 - Supraganglionic infarction (M4-M6 cortex): 3 Total score (0-10 with 10 being normal): 10 IMPRESSION: 1. Acute intraparenchymal hemorrhage in the left thalamus with measurements of 1.6 x 1.7 x 2.4 (volume =  3.4) Cm mild surrounding edema. Intraventricular penetration with blood in the left lateral ventricle. No ventricular obstruction. 2. ASPECTS is 10. 3. These results were communicated to Dr. Cheral Marker at 8:57 amon 6/5/2021by text page via the [facility] messaging system. Electronically Signed   By: Nelson Chimes M.D.   On: 05/12/2020 08:58    Time Spent in minutes  30   Lala Lund M.D on 05/31/2020 at 10:03 AM  To page go to [email or web address] - password [email or web address]

## 2020-05-31 NOTE — Progress Notes (Signed)
Occupational Therapy Evaluation Patient Details Name: Terry Barton MRN: [id number] DOB: November 01, 1956 Today's Date: 05/31/2020    History of Present Illness 64 y.o. male with history of recent admission for left thalamic intracranial bleed in the setting of hypertension was discharged home on May 16, 2020 and as per the patient's son was doing fine until the last 24 hours when patient has become less alert. 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 +Acute encephalopathy, acute renal failure, hypokalemia. Significant PMH also includes HTN   Clinical Impression   Patient lives in an apartment with his brother and son.  He is total assist with mobility and LB ADLs at home, and can typically feed him self with set up and perform grooming/dressing with min assist (prior to 6/9).  Today patient needing min assist with feeing and mod assist with UB dressing.  Patient R UE has increased tone, decreased strength, and limited ROM.  Patient would benefit from OT to work on increasing R UE mobility to improve his independence level.      Follow Up Recommendations  SNF    Equipment Recommendations  Hospital bed    Recommendations for Other Services       Precautions / Restrictions Precautions Precautions: Fall Restrictions Weight Bearing Restrictions: No      Mobility Bed Mobility Overal bed mobility: Needs Assistance Bed Mobility: Rolling Rolling: +2 for physical assistance;Total assist            Transfers                 General transfer comment: did not attempt during this session.  Will require maxi move    Balance                                           ADL either performed or assessed with clinical judgement   ADL Overall ADL's : Needs assistance/impaired Eating/Feeding: Set up;Bed level Eating/Feeding Details (indicate cue type and reason): pt requires everyting placed toward L side and   set up Grooming: Minimal assistance   Upper Body Bathing: Moderate assistance   Lower Body Bathing: Total assistance   Upper Body Dressing : Moderate assistance   Lower Body Dressing: Total assistance   Toilet Transfer: Total assistance;+2 for physical assistance Toilet Transfer Details (indicate cue type and reason): maxi move         Functional mobility during ADLs: Total assistance;+2 for physical assistance;+2 for safety/equipment       Vision Patient Visual Report: Blurring of vision (reports more blurry than normal) Vision Assessment?: Yes Eye Alignment: Within Functional Limits Ocular Range of Motion: Within Functional Limits Tracking/Visual Pursuits: Able to track stimulus in all quads without difficulty Visual Fields: No apparent deficits     Perception     Praxis      Pertinent Vitals/Pain Pain Assessment: No/denies pain     Hand Dominance Left   Extremity/Trunk Assessment Upper Extremity Assessment Upper Extremity Assessment: RUE deficits/detail;LUE deficits/detail RUE Deficits / Details: Increased tone with arm in flexor synergy, though unable to fully flex digits to make fist.  Shoulder flexion to 20 degrees, elbow extension to 40 degrees. Shouler strength and elbow extension 2-/5, rest of arm 2/5 RUE Sensation: decreased light touch;decreased proprioception RUE Coordination: decreased gross motor;decreased fine motor LUE Deficits / Details: AROM digits wrist and elbow. pt  with AAROM 90 degree flexion shoulder then reports discomfort.. Other motions in L UE WFL   Lower Extremity Assessment Lower Extremity Assessment: Defer to PT evaluation       Communication Communication Communication: No difficulties   Cognition Arousal/Alertness: Awake/alert Behavior During Therapy: Flat affect;WFL for tasks assessed/performed Overall Cognitive Status: No family/caregiver present to determine baseline cognitive functioning Area of Impairment: Problem  solving;Memory                     Memory: Decreased short-term memory       Problem Solving: Slow processing;Requires verbal cues General Comments: Patient flat and had some trouble explaining full home set up/history   General Comments  vitals WNL    Exercises     Shoulder Instructions      Home Living Family/patient expects to be discharged to:: Private residence Living Arrangements: Children Available Help at Discharge: Family;Available 24 hours/day (brother there all day, son also lives there) Type of Home: Apartment Home Access: Level entry     Home Layout: Two level;Able to live on main level with bedroom/bathroom     Bathroom Shower/Tub: Other (comment) (sponge bathes)         Home Equipment: Wheelchair - manual;Other (comment);Wheelchair - power (lift)          Prior Functioning/Environment Level of Independence: Needs assistance  Gait / Transfers Assistance Needed: uses power chair ADL's / Homemaking Assistance Needed: sponge bath total (A) in bed            OT Problem List: Decreased strength;Decreased activity tolerance;Decreased range of motion;Impaired balance (sitting and/or standing);Decreased cognition;Decreased safety awareness;Decreased knowledge of use of DME or AE;Obesity;Impaired UE functional use      OT Treatment/Interventions: Self-care/ADL training;Therapeutic exercise;Neuromuscular education;Energy conservation;DME and/or AE instruction;Manual therapy;Modalities;Splinting;Therapeutic activities;Cognitive remediation/compensation;Patient/family education;Balance training    OT Goals(Current goals can be found in the care plan section) Acute Rehab OT Goals Patient Stated Goal: Move R arm better OT Goal Formulation: With patient Time For Goal Achievement: 06/14/20 Potential to Achieve Goals: Good  OT Frequency: Min 2X/week   Barriers to D/C:            Co-evaluation              AM-PAC OT "6 Clicks" Daily  Activity     Outcome Measure Help from another person eating meals?: A Little Help from another person taking care of personal grooming?: A Lot Help from another person toileting, which includes using toliet, bedpan, or urinal?: Total Help from another person bathing (including washing, rinsing, drying)?: Total Help from another person to put on and taking off regular upper body clothing?: A Lot Help from another person to put on and taking off regular lower body clothing?: Total 6 Click Score: 10   End of Session Nurse Communication: Mobility status;Need for lift equipment  Activity Tolerance: Patient tolerated treatment well Patient left: in bed;with call bell/phone within reach;with bed alarm set;Other (comment) (with Electrical engineer)  OT Visit Diagnosis: Muscle weakness (generalized) (M62.81)                Time: 7622-6333 OT Time Calculation (min): 18 min Charges:  OT General Charges $OT Visit: 1 Visit OT Evaluation $OT Eval Moderate Complexity: 1 [address] OTR/L   Phylliss Bob 05/31/2020, 1:44 PM

## 2020-06-01 LAB — CBC WITH DIFFERENTIAL/PLATELET
Abs Immature Granulocytes: 0.51 10*3/uL — ABNORMAL HIGH (ref 0.00–0.07)
Basophils Absolute: 0.1 10*3/uL (ref 0.0–0.1)
Basophils Relative: 0 %
Eosinophils Absolute: 0 10*3/uL (ref 0.0–0.5)
Eosinophils Relative: 0 %
HCT: 34.2 % — ABNORMAL LOW (ref 39.0–52.0)
Hemoglobin: 11.2 g/dL — ABNORMAL LOW (ref 13.0–17.0)
Immature Granulocytes: 4 %
Lymphocytes Relative: 14 %
Lymphs Abs: 1.9 10*3/uL (ref 0.7–4.0)
MCH: 31.4 pg (ref 26.0–34.0)
MCHC: 32.7 g/dL (ref 30.0–36.0)
MCV: 95.8 fL (ref 80.0–100.0)
Monocytes Absolute: 1 10*3/uL (ref 0.1–1.0)
Monocytes Relative: 8 %
Neutro Abs: 9.7 10*3/uL — ABNORMAL HIGH (ref 1.7–7.7)
Neutrophils Relative %: 74 %
Platelets: 425 10*3/uL — ABNORMAL HIGH (ref 150–400)
RBC: 3.57 MIL/uL — ABNORMAL LOW (ref 4.22–5.81)
RDW: 12.7 % (ref 11.5–15.5)
WBC: 13.1 10*3/uL — ABNORMAL HIGH (ref 4.0–10.5)
nRBC: 0 % (ref 0.0–0.2)

## 2020-06-01 LAB — CULTURE, BLOOD (ROUTINE X 2)
Culture: NO GROWTH
Culture: NO GROWTH
Special Requests: ADEQUATE
Special Requests: ADEQUATE

## 2020-06-01 LAB — BRAIN NATRIURETIC PEPTIDE: B Natriuretic Peptide: 207.1 pg/mL — ABNORMAL HIGH (ref 0.0–100.0)

## 2020-06-01 LAB — C-REACTIVE PROTEIN: CRP: 1.4 mg/dL — ABNORMAL HIGH (ref <1.0)

## 2020-06-01 LAB — COMPREHENSIVE METABOLIC PANEL
ALT: 38 U/L (ref 0–44)
AST: 34 U/L (ref 15–41)
Albumin: 2.7 g/dL — ABNORMAL LOW (ref 3.5–5.0)
Alkaline Phosphatase: 89 U/L (ref 38–126)
Anion gap: 12 (ref 5–15)
BUN: 40 mg/dL — ABNORMAL HIGH (ref 8–23)
CO2: 18 mmol/L — ABNORMAL LOW (ref 22–32)
Calcium: 9.1 mg/dL (ref 8.9–10.3)
Chloride: 104 mmol/L (ref 98–111)
Creatinine, Ser: 2.02 mg/dL — ABNORMAL HIGH (ref 0.61–1.24)
GFR calc Af Amer: 39 mL/min — ABNORMAL LOW (ref >60)
GFR calc non Af Amer: 33 mL/min — ABNORMAL LOW (ref >60)
Glucose, Bld: 239 mg/dL — ABNORMAL HIGH (ref 70–99)
Potassium: 4.2 mmol/L (ref 3.5–5.1)
Sodium: 134 mmol/L — ABNORMAL LOW (ref 135–145)
Total Bilirubin: 0.5 mg/dL (ref 0.3–1.2)
Total Protein: 7.2 g/dL (ref 6.5–8.1)

## 2020-06-01 LAB — URINE CULTURE: Culture: 70000 — AB

## 2020-06-01 LAB — GLUCOSE, CAPILLARY: Glucose-Capillary: 216 mg/dL — ABNORMAL HIGH (ref 70–99)

## 2020-06-01 LAB — MAGNESIUM: Magnesium: 2.1 mg/dL (ref 1.7–2.4)

## 2020-06-01 MED ORDER — METOPROLOL TARTRATE 50 MG PO TABS: 50.0000 mg | ORAL_TABLET | Freq: Two times a day (BID) | ORAL | 0 refills | Status: DC

## 2020-06-01 MED ORDER — PANTOPRAZOLE SODIUM 40 MG PO TBEC: 40.0000 mg | DELAYED_RELEASE_TABLET | Freq: Every day | ORAL | 0 refills | Status: DC

## 2020-06-01 MED ORDER — AMLODIPINE BESYLATE 10 MG PO TABS: 10.0000 mg | ORAL_TABLET | Freq: Every day | ORAL | 0 refills | Status: DC

## 2020-06-01 MED ORDER — AMLODIPINE BESYLATE 10 MG PO TABS: 10.0000 mg | ORAL_TABLET | Freq: Every day | ORAL | Status: DC

## 2020-06-01 MED ORDER — AMLODIPINE BESYLATE 5 MG PO TABS
5.0000 mg | ORAL_TABLET | Freq: Once | ORAL | Status: AC
  Administered 2020-06-01: 5 mg via ORAL
  Filled 2020-06-01: qty 1

## 2020-06-01 MED ORDER — METOPROLOL TARTRATE 25 MG PO TABS
25.0000 mg | ORAL_TABLET | Freq: Once | ORAL | Status: AC
  Administered 2020-06-01: 25 mg via ORAL
  Filled 2020-06-01: qty 1

## 2020-06-01 MED ORDER — METOPROLOL TARTRATE 50 MG PO TABS: 50.0000 mg | ORAL_TABLET | Freq: Two times a day (BID) | ORAL | Status: DC

## 2020-06-01 MED ORDER — CEPHALEXIN 500 MG PO CAPS: 500.0000 mg | ORAL_CAPSULE | Freq: Four times a day (QID) | ORAL | 0 refills | Status: AC

## 2020-06-01 MED ORDER — TRAZODONE HCL 50 MG PO TABS: 50.0000 mg | ORAL_TABLET | Freq: Every evening | ORAL | 0 refills | Status: AC | PRN

## 2020-06-01 NOTE — Progress Notes (Signed)
Terry Barton to be D/C'd Home per MD order.  Discussed with the patient and all questions fully answered.  VSS, Skin clean, dry and intact without evidence of skin break down, no evidence of skin tears noted. IV catheter discontinued intact. Site without signs and symptoms of complications. Dressing and pressure applied.  An After Visit Summary was printed and given to the ambulance transport to give to Son Terry Barton. Patient's son received prescription information  D/c education completed with family including follow up instructions, medication list, d/c activities limitations if indicated, with other d/c instructions as indicated by MD - patient's son able to verbalize understanding, all questions fully answered.   Patient's son instructed to return to ED, call 911, or call MD for any changes in condition.   Patient escorted via stretcher, and D/C home via ambulance transport.  Terry Barton 06/01/2020 2:16 PM

## 2020-06-01 NOTE — Discharge Instructions (Signed)
Follow with Primary MD  in 7 days   Get CBC, CMP, Magnesium checked next visit within 1 week by Primary MD   Activity: As tolerated with Full fall precautions use walker/cane & assistance as needed  Disposition Home   Diet: Soft heart healthy diet with feeding assistance and aspiration precautions.   Special Instructions: If you have smoked or chewed Tobacco  in the last 2 yrs please stop smoking, stop any regular Alcohol  and or any Recreational drug use.  On your next visit with your primary care physician please Get Medicines reviewed and adjusted.  Please request your Prim.MD to go over all Hospital Tests and Procedure/Radiological results at the follow up, please get all Hospital records sent to your Prim MD by signing hospital release before you go home.  If you experience worsening of your admission symptoms, develop shortness of breath, life threatening emergency, suicidal or homicidal thoughts you must seek medical attention immediately by calling 911 or calling your MD immediately  if symptoms less severe.  You Must read complete instructions/literature along with all the possible adverse reactions/side effects for all the Medicines you take and that have been prescribed to you. Take any new Medicines after you have completely understood and accpet all the possible adverse reactions/side effects.

## 2020-06-01 NOTE — Progress Notes (Signed)
Inpatient Diabetes Program Recommendations  AACE/ADA: New Consensus Statement on Inpatient Glycemic Control (2015)  Target Ranges:  Prepandial:   less than 140 mg/dL      Peak postprandial:   less than 180 mg/dL (1-2 hours)      Critically ill patients:  140 - 180 mg/dL   Results for TRUXTON, STUPKA (MRN [id number]) as of 06/01/2020 09:50  Ref. Range 05/30/2020 04:15 05/30/2020 07:24 05/30/2020 16:08 05/30/2020 19:58  Glucose-Capillary Latest Ref Range: 70 - 99 mg/dL 431 (H) 427 (H) 670 (H) 271 (H)   Results for DERRIN, CURREY (MRN [id number]) as of 06/01/2020 09:50  Ref. Range 05/31/2020 04:10 05/31/2020 11:51 05/31/2020 20:43 06/01/2020 07:37  Glucose-Capillary Latest Ref Range: 70 - 99 mg/dL 164 (H) 353 (H) 912 (H) 216 (H)    Admit with: AMS  History: Spinal Cord Injury  No History of Diabetes     MD- Note patient getting Decadron 6 mg Daily.  Getting CBGs checked Q8 hours and having CBGs >200 likely due to the Decadron.  Please consider adding Novolog Sensitive Correction Scale/ SSI (0-9 units) TID AC + HS    --Will follow patient during hospitalization--  Ambrose Finland RN, MSN, CDE Diabetes Coordinator Inpatient Glycemic Control Team Team Pager: [phone] (8a-5p)

## 2020-06-01 NOTE — TOC Initial Note (Signed)
Transition of Care [facility]) - Initial/Assessment Note    Patient Details  Name: Terry Barton MRN: [id number] Date of Birth: 1956-08-26  Transition of [facility]) CM/SW Contact:    Bartholomew Crews, RN Phone Number: [phone] 06/01/2020, 11:55 AM  Clinical Narrative:                  Notified by nursing of patient needing ambulance transport home. Spoke with son, [name], on the phone. Patient is established with CAP services, and he has a hospital bed, hoyer, and wheelchair. Son is requesting 3N1. DME order placed and referral sent to [employer] to deliver to room. Son to bring clothes for patient to wear home and will pick up 3N1 from the room. Discussed PCP possibilities for patient to become established, and encouraged follow up with insurance for in network. Discharge prescriptions reviewed. Verified home address for transport. TOC following for transition needs.   Expected Discharge Plan: Home/Self Care Barriers to Discharge: No Barriers Identified   Patient Goals and CMS Choice Patient states their goals for this hospitalization and ongoing recovery are:: return home with family CMS Medicare.gov Compare Post Acute Care list provided to:: Patient Represenative (must comment) ([name] Dorey, son) Choice offered to / list presented to : Adult Children ([name] Ahart, son)  Expected Discharge Plan and Services Expected Discharge Plan: Home/Self Care In-house Referral: NA Discharge Planning Services: CM Consult Post Acute Care Choice: Durable Medical Equipment Living arrangements for the past 2 months: Single Family Home Expected Discharge Date: 06/01/20               DME Arranged: 3-N-1 DME Agency: [employer] Date DME Agency Contacted: 06/01/20 Time DME Agency Contacted: 68 Representative spoke with at DME Agency: [place]: NA [place] Agency: NA        Prior Living Arrangements/Services Living arrangements for the past 2 months: [place] with:: Adult  Children, Self, Siblings          Need for Family Participation in Patient Care: Yes (Comment) Care giver support system in place?: Yes (comment) Current home services: DME, Other (comment) (wheelchair, hoyer, hospital bed; CAP services) Criminal Activity/Legal Involvement Pertinent to Current Situation/Hospitalization: No - Comment as needed  Activities of Daily Living Home Assistive Devices/Equipment: Blood pressure cuff, Electric scooter, Eyeglasses, [employer] fast streamer ADL Screening (condition at time of admission) Patient's cognitive ability adequate to safely complete daily activities?: No Is the patient deaf or have difficulty hearing?: No Does the patient have difficulty seeing, even when wearing glasses/contacts?: No Does the patient have difficulty concentrating, remembering, or making decisions?: Yes Patient able to express need for assistance with ADLs?: Yes Does the patient have difficulty dressing or bathing?: Yes Independently performs ADLs?: No Communication: Independent Dressing (OT): Dependent Is this a change from baseline?: Pre-admission baseline Grooming: Dependent Is this a change from baseline?: Pre-admission baseline Feeding: Needs assistance Is this a change from baseline?: Pre-admission baseline Bathing: Dependent Is this a change from baseline?: Pre-admission baseline Toileting: Dependent Is this a change from baseline?: Pre-admission baseline In/Out Bed: Dependent Is this a change from baseline?: Pre-admission baseline Walks in Home: Dependent Is this a change from baseline?: Pre-admission baseline Does the patient have difficulty walking or climbing stairs?: Yes Weakness of Legs: Both Weakness of Arms/Hands: Both  Permission Sought/Granted                  Emotional Assessment         Alcohol / Substance Use: Not Applicable Psych Involvement:  No (comment)  Admission diagnosis:  Dehydration [E86.0] ARF (acute renal failure) (HCC)  [N17.9] Weakness [R53.1] Patient Active Problem List   Diagnosis Date Noted  . Acute encephalopathy 05/28/2020  . ARF (acute renal failure) (HCC) 05/27/2020  . Essential hypertension 05/17/2020  . Hyperlipidemia 05/17/2020  . CKD (chronic kidney disease), stage III 05/17/2020  . Hypokalemia 05/17/2020  . ICH (intracerebral hemorrhage) (HCC) d/t HTN 05/12/2020   PCP:  Patient, No Pcp Per Pharmacy:   [id number] - [place], [place] - [address] AT [facility] OF [address] [place] [address] Phone: [phone] Fax: [phone]     Social Determinants of Health (SDOH) Interventions    Readmission Risk Interventions No flowsheet data found.

## 2020-06-01 NOTE — Discharge Summary (Signed)
Kohlton Gilpatrick ZOX:[id number] DOB: [birth year] DOA: 05/27/2020  PCP: Patient, No Pcp Per  Admit date: 05/27/2020  Discharge date: 06/01/2020  Admitted From: Home   Disposition:  Home   Recommendations for Outpatient Follow-up:   Follow up with PCP in 1-2 weeks  PCP Please obtain BMP/CBC, 2 view CXR in 1week,  (see Discharge instructions)   PCP Please follow up on the following pending results:    Home Health: None   Equipment/Devices: None  Consultations: None  Discharge Condition: Stable    CODE STATUS: Full    Diet Recommendation: Soft Diet  Diet Order            DIET DYS 3 Room service appropriate? No; Fluid consistency: Thin  Diet effective now                  Chief Complaint  Patient presents with  . 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  He was now brought to the hospital for increased confusion and lack of responsiveness, was diagnosed with UTI and dehydration and admitted.                                                                 Hospital Course    1.  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 coli UTI, was treated with IV fluids and IV Rocephin.  He was also given short course of IV steroids as he had a hint of edema around his recent brain bleed, with supportive care he has remarkably improved and now close to baseline, upon discharge will give him 5 more days of oral Keflex, discontinue his HCTZ, follow with PCP in a week.   2.  Dysphagia.  Due to #1 above.  Speech saw the patient, stable on on dysphagia 3 diet.  3.  Hypertension.    After hydration blood pressure is actually on the higher side when he came he was hypotensive, have placed him on 10 mg of Norvasc instead of 5, Lopressor dose increased as well, HCTZ discontinued.  PCP to monitor and adjust.  If he gets hypotensive  please rule out dehydration.  4.  Anemia of chronic disease.  Stable.  5.  AKI on CKD 3 with baseline creatinine is between 1.6-2.  AKI due to dehydration much improved with IV fluids, discontinue HCTZ upon discharge, PCP to monitor.   Discharge diagnosis     Active Problems:   ICH (intracerebral hemorrhage) (HCC) d/t HTN   Essential hypertension   CKD (chronic kidney disease), stage III   ARF (acute renal failure) (HCC)   Acute encephalopathy    Discharge instructions    Discharge Instructions    Discharge instructions   Complete by: As directed    Follow with Primary MD  in 7 days   Get CBC, CMP, Magnesium checked next visit within 1 week by Primary MD   Activity: As tolerated with Full fall precautions use walker/cane & assistance as needed  Disposition Home   Diet: Soft heart healthy diet with feeding assistance and aspiration precautions.   Special Instructions: If you have smoked or chewed Tobacco  in the last 2 yrs please stop smoking, stop any regular Alcohol  and or any Recreational drug use.  On your next visit with your primary care physician please Get Medicines reviewed and adjusted.  Please request your Prim.MD to go over all Hospital Tests and  Procedure/Radiological results at the follow up, please get all Hospital records sent to your Prim MD by signing hospital release before you go home.  If you experience worsening of your admission symptoms, develop shortness of breath, life threatening emergency, suicidal or homicidal thoughts you must seek medical attention immediately by calling 911 or calling your MD immediately  if symptoms less severe.  You Must read complete instructions/literature along with all the possible adverse reactions/side effects for all the Medicines you take and that have been prescribed to you. Take any new Medicines after you have completely understood and accpet all the possible adverse reactions/side effects.   Increase activity slowly   Complete by: As directed       Discharge Medications   Allergies as of 06/01/2020   No Known Allergies     Medication List    STOP taking these medications   hydrochlorothiazide 25 MG tablet Commonly known as: HYDRODIURIL     TAKE these medications   acetaminophen 500 MG tablet Commonly known as: TYLENOL Take 1,000 mg by mouth every 6 (six) hours as needed.   amLODipine 10 MG tablet Commonly known as: NORVASC Take 1 tablet (10 mg total) by mouth daily. What changed:   medication strength  how much to take   cephALEXin 500 MG capsule Commonly known as: KEFLEX Take 1 capsule (500 mg total) by mouth 4 (four) times daily for 3 days.   cyproheptadine 4 MG tablet Commonly known as: PERIACTIN Take 4 mg by mouth 3 (three) times daily.   ferrous sulfate 325 (65 FE) MG EC tablet Take 325 mg by mouth 3 (three) times daily with meals.   metoprolol tartrate 50 MG tablet Commonly known as: LOPRESSOR Take 1 tablet (50 mg total) by mouth 2 (two) times daily. What changed:   medication strength  how much to take   pantoprazole 40 MG tablet Commonly known as: PROTONIX Take 1 tablet (40 mg total) by mouth daily. Start taking on: June 02, 2020     traZODone 50 MG tablet Commonly known as: DESYREL Take 50 mg by mouth at bedtime as needed for sleep.   Vitamin C 500 MG Caps Take 1 capsule by mouth daily.  Follow-up Information    PCP. Schedule an appointment as soon as possible for a visit in 1 week(s).               Major procedures and Radiology Reports - PLEASE review detailed and final reports thoroughly  -        Chest 2 View  Result Date: 05/12/2020 CLINICAL DATA:  New onset slurred speech and right arm and right leg weakness. EXAM: CHEST - 2 VIEW COMPARISON:  None. FINDINGS: There is right-sided opacity extending from the mid lung to the right lung base, obscuring the right hemidiaphragm and portions of the right heart border. In part this is due to a moderate pleural effusion. There is associated parenchymal opacity that is consistent with atelectasis or infection. Remainder of the lungs is clear. No left pleural effusion. No pneumothorax. Cardiac silhouette is top-normal in size. No mediastinal or hilar masses or convincing adenopathy. Skeletal structures are grossly intact. IMPRESSION: 1. Moderate right-sided pleural effusion associated with right lung base parenchymal opacity, the latter consistent with atelectasis or pneumonia. 2. No other evidence of acute cardiopulmonary disease. No pulmonary edema. Electronically Signed   By: Amie Portlandavid  Ormond M.D.   On: 05/12/2020 11:08   CT HEAD WO CONTRAST  Result Date: 05/29/2020 CLINICAL DATA:  Stroke follow-up EXAM: CT HEAD WITHOUT CONTRAST TECHNIQUE: Contiguous axial images were obtained from the base of the skull through the vertex without intravenous contrast. COMPARISON:  Head CT 05/27/2020 FINDINGS: Brain: Unchanged size of left thalamic intraparenchymal hematoma mild surrounding edema. Small amount of blood layering in both occipital horns is unchanged. No new site of hemorrhage. Size and configuration of the ventricles are unchanged. There is generalized volume loss.  Vascular: No abnormal hyperdensity of the major intracranial arteries or dural venous sinuses. No intracranial atherosclerosis. Skull: The visualized skull base, calvarium and extracranial soft tissues are normal. Sinuses/Orbits: Small amount of fluid in the sphenoid sinus. The orbits are normal. IMPRESSION: 1. Unchanged size of left thalamic intraparenchymal hematoma with mild surrounding edema. 2. Unchanged small amount of blood layering in both occipital horns. 3. No new site of hemorrhage. Electronically Signed   By: Deatra RobinsonKevin  Herman M.D.   On: 05/29/2020 21:14   CT Head Wo Contrast  Result Date: 05/27/2020 CLINICAL DATA:  Encephalopathy.  Follow-up left thalamic hemorrhage. EXAM: CT HEAD WITHOUT CONTRAST TECHNIQUE: Contiguous axial images were obtained from the base of the skull through the vertex without intravenous contrast. COMPARISON:  05/12/2020 FINDINGS: Brain: Examination demonstrates interval decrease in size of patient's acute focal hemorrhage over the region of the left thalamus now measuring 1 x 1 x 1.6 cm (previously 1.6 x 1.7 x 2.4 cm). Persistent mild adjacent edema. Interval improvement with tiny amount of residual intraventricular hemorrhage over the dependent portion of the occipital horns. No new areas of hemorrhage. Ventricles, cisterns and other CSF spaces otherwise unchanged. No evidence of mass or mass effect. No midline shift. Evidence of chronic ischemic microvascular disease. Vascular: No hyperdense vessel or unexpected calcification. Skull: Normal. Negative for fracture or focal lesion. Sinuses/Orbits: Orbits are normal. Paranasal sinuses are well developed with small air-fluid levels over the sphenoid sinuses. Other: None. IMPRESSION: 1. Interval decrease in size of patient's acute hemorrhage over the left thalamus now measuring 1 x 1 x 1.6 cm (previously 1.6 x 1.7 x 2.4 cm). Stable mild adjacent edema. Interval improvement and associated intraventricular hemorrhage with only minimal  residual hemorrhage over the occipital horns. No new areas of hemorrhage. 2.  Mild chronic  ischemic microvascular disease. Electronically Signed   By: Elberta Fortis M.D.   On: 05/27/2020 16:37   MR ANGIO HEAD WO CONTRAST  Result Date: 05/13/2020 CLINICAL DATA:  Stroke, follow-up. EXAM: MRI HEAD WITHOUT CONTRAST MRA HEAD WITHOUT CONTRAST TECHNIQUE: Multiplanar, multiecho pulse sequences of the brain and surrounding structures were obtained without intravenous contrast. Angiographic images of the head were obtained using MRA technique without contrast. COMPARISON:  Noncontrast head CT performed earlier the same day 05/12/2020 FINDINGS: MRI HEAD FINDINGS Brain: Mild intermittent motion degradation. An acute parenchymal hemorrhage centered within the left thalamus has not significantly changed in size, measuring 1.7 x 1.8 cm in transaxial dimensions. Similar appearance of surrounding edema. There is no significant ventricular effacement. No midline shift. Intraventricular extension into the left lateral ventricle does not appear significantly changed. However, there is a new small amount of hemorrhage within the right occipital horn. No hydrocephalus at this time. Mild patchy T2/FLAIR hyperintensity within the cerebral white matter is nonspecific, but consistent with chronic small vessel ischemic disease. Redemonstrated mild generalized parenchymal atrophy. No evidence of intracranial mass. No extra-axial fluid collection. Vascular: Reported below. Skull and upper cervical spine: No focal marrow lesion. Sinuses/Orbits: Visualized orbits show no acute finding. Mild ethmoid, sphenoid and maxillary sinus mucosal thickening. Trace bilateral mastoid effusions. MRA HEAD FINDINGS The examination is mild to moderately motion degraded, which limits evaluation for stenoses and for small aneurysms. The intracranial internal carotid arteries are patent. There is an apparent severe stenosis within the right ICA at the level of  the skull base, which may be accentuated by artifact (series 1030, image 7). No other significant stenosis is identified within the internal carotid arteries. The M1 middle cerebral arteries are patent without significant stenosis. There is an attenuated appearance of multiple mid M2 and more distal right MCA branch vessels. Additionally, there is an attenuated appearance of multiple distal M2 and more distal left MCA branch vessels. Findings may be related to motion artifact. High-grade stenoses within these vessels cannot be excluded. The anterior cerebral arteries are patent without significant proximal stenosis. No intracranial aneurysm is identified. The intracranial vertebral arteries are patent bilaterally with the right being slightly dominant. The basilar artery is patent without significant stenosis. The posterior cerebral arteries are patent proximally without significant stenosis. Attenuated appear of the P3 and more distal PCA branches bilaterally which may reflect motion artifact. Stay high-grade stenoses within these vessels cannot be excluded. IMPRESSION: MRI brain: 1. Unchanged size of an acute left thalamic parenchymal hemorrhage. Surrounding edema has also not significantly changed. No significant ventricular effacement. No midline shift. 2. Similar appearance of ventricular extension into the left lateral ventricle. New from prior examination, there is a small amount of hemorrhage within the right occipital horn. No evidence of hydrocephalus at this time. 3. Mild generalized parenchymal atrophy and chronic small vessel ischemic disease. 4. Mild ethmoid sinus mucosal thickening. 5. Trace bilateral mastoid effusions. MRA head: 1. Mild-to-moderate motion degradation limits evaluation. 2. No intracranial aneurysm is identified. 3. Apparent severe stenosis within the right ICA at the level of the skull base, which may be accentuated by susceptibility artifact at this level. 4. Attenuated appearance of  multiple mid M2 and more distal right MCA branch vessels. Additionally, there is an attenuated appearance of multiple distal M2 and more distal left MCA branch vessels. Findings may be related to motion artifact. High-grade stenoses within these vessels cannot be excluded. 5. Attenuated appearance of the P3 and more distal PCA branches bilaterally. These findings  may also reflect motion artifact. High-grade stenoses within these vessels cannot be excluded. Electronically Signed   By: Jackey Loge DO   On: 05/13/2020 14:45   MR BRAIN WO CONTRAST  Result Date: 05/13/2020 CLINICAL DATA:  Stroke, follow-up. EXAM: MRI HEAD WITHOUT CONTRAST MRA HEAD WITHOUT CONTRAST TECHNIQUE: Multiplanar, multiecho pulse sequences of the brain and surrounding structures were obtained without intravenous contrast. Angiographic images of the head were obtained using MRA technique without contrast. COMPARISON:  Noncontrast head CT performed earlier the same day 05/12/2020 FINDINGS: MRI HEAD FINDINGS Brain: Mild intermittent motion degradation. An acute parenchymal hemorrhage centered within the left thalamus has not significantly changed in size, measuring 1.7 x 1.8 cm in transaxial dimensions. Similar appearance of surrounding edema. There is no significant ventricular effacement. No midline shift. Intraventricular extension into the left lateral ventricle does not appear significantly changed. However, there is a new small amount of hemorrhage within the right occipital horn. No hydrocephalus at this time. Mild patchy T2/FLAIR hyperintensity within the cerebral white matter is nonspecific, but consistent with chronic small vessel ischemic disease. Redemonstrated mild generalized parenchymal atrophy. No evidence of intracranial mass. No extra-axial fluid collection. Vascular: Reported below. Skull and upper cervical spine: No focal marrow lesion. Sinuses/Orbits: Visualized orbits show no acute finding. Mild ethmoid, sphenoid and  maxillary sinus mucosal thickening. Trace bilateral mastoid effusions. MRA HEAD FINDINGS The examination is mild to moderately motion degraded, which limits evaluation for stenoses and for small aneurysms. The intracranial internal carotid arteries are patent. There is an apparent severe stenosis within the right ICA at the level of the skull base, which may be accentuated by artifact (series 1030, image 7). No other significant stenosis is identified within the internal carotid arteries. The M1 middle cerebral arteries are patent without significant stenosis. There is an attenuated appearance of multiple mid M2 and more distal right MCA branch vessels. Additionally, there is an attenuated appearance of multiple distal M2 and more distal left MCA branch vessels. Findings may be related to motion artifact. High-grade stenoses within these vessels cannot be excluded. The anterior cerebral arteries are patent without significant proximal stenosis. No intracranial aneurysm is identified. The intracranial vertebral arteries are patent bilaterally with the right being slightly dominant. The basilar artery is patent without significant stenosis. The posterior cerebral arteries are patent proximally without significant stenosis. Attenuated appear of the P3 and more distal PCA branches bilaterally which may reflect motion artifact. Stay high-grade stenoses within these vessels cannot be excluded. IMPRESSION: MRI brain: 1. Unchanged size of an acute left thalamic parenchymal hemorrhage. Surrounding edema has also not significantly changed. No significant ventricular effacement. No midline shift. 2. Similar appearance of ventricular extension into the left lateral ventricle. New from prior examination, there is a small amount of hemorrhage within the right occipital horn. No evidence of hydrocephalus at this time. 3. Mild generalized parenchymal atrophy and chronic small vessel ischemic disease. 4. Mild ethmoid sinus mucosal  thickening. 5. Trace bilateral mastoid effusions. MRA head: 1. Mild-to-moderate motion degradation limits evaluation. 2. No intracranial aneurysm is identified. 3. Apparent severe stenosis within the right ICA at the level of the skull base, which may be accentuated by susceptibility artifact at this level. 4. Attenuated appearance of multiple mid M2 and more distal right MCA branch vessels. Additionally, there is an attenuated appearance of multiple distal M2 and more distal left MCA branch vessels. Findings may be related to motion artifact. High-grade stenoses within these vessels cannot be excluded. 5. Attenuated appearance of the  P3 and more distal PCA branches bilaterally. These findings may also reflect motion artifact. High-grade stenoses within these vessels cannot be excluded. Electronically Signed   By: Jackey Loge DO   On: 05/13/2020 14:45   DG Chest Port 1 View  Result Date: 05/27/2020 CLINICAL DATA:  Altered mental status.  Weakness. EXAM: PORTABLE CHEST 1 VIEW COMPARISON:  05/12/2020 FINDINGS: Lungs are adequately inflated without focal airspace consolidation, effusion or pneumothorax. Interval resolution of the previously seen moderate right effusion. Cardiomediastinal silhouette and remainder of the exam is unchanged. IMPRESSION: No acute cardiopulmonary disease. Electronically Signed   By: Elberta Fortis M.D.   On: 05/27/2020 16:30   ECHOCARDIOGRAM COMPLETE  Result Date: 05/12/2020    ECHOCARDIOGRAM REPORT   Patient Name:   LIAM CAMMARATA Date of Exam: 05/12/2020 Medical Rec #:  [id number]    Height: Accession #:    [id number]   Weight:       171.3 lb Date of Birth:  1956/01/03    BSA:          1.821 m Patient Age:    64 years     BP:           133/70 mmHg Patient Gender: M            HR:           60 bpm. Exam Location:  Inpatient Procedure: 2D Echo Indications:    stroke 434.91  History:        Patient has no prior history of Echocardiogram examinations.  Sonographer:    Delcie Roch  Referring Phys: [id number] ERIC LINDZEN IMPRESSIONS  1. Left ventricular ejection fraction, by estimation, is 60 to 65%. The left ventricle has normal function. The left ventricle has no regional wall motion abnormalities. There is mild concentric left ventricular hypertrophy. Left ventricular diastolic parameters are consistent with Grade I diastolic dysfunction (impaired relaxation).  2. Right ventricular systolic function is normal. The right ventricular size is normal. Tricuspid regurgitation signal is inadequate for assessing PA pressure.  3. The mitral valve is normal in structure. Trivial mitral valve regurgitation. No evidence of mitral stenosis.  4. The aortic valve is normal in structure. Aortic valve regurgitation is not visualized. No aortic stenosis is present.  5. The inferior vena cava is normal in size with greater than 50% respiratory variability, suggesting right atrial pressure of 3 mmHg. FINDINGS  Left Ventricle: Left ventricular ejection fraction, by estimation, is 60 to 65%. The left ventricle has normal function. The left ventricle has no regional wall motion abnormalities. The left ventricular internal cavity size was normal in size. There is  mild concentric left ventricular hypertrophy. Left ventricular diastolic parameters are consistent with Grade I diastolic dysfunction (impaired relaxation). Normal left ventricular filling pressure. Right Ventricle: The right ventricular size is normal. No increase in right ventricular wall thickness. Right ventricular systolic function is normal. Tricuspid regurgitation signal is inadequate for assessing PA pressure. Left Atrium: Left atrial size was normal in size. Right Atrium: Right atrial size was normal in size. Pericardium: There is no evidence of pericardial effusion. Mitral Valve: The mitral valve is normal in structure. Normal mobility of the mitral valve leaflets. Trivial mitral valve regurgitation. No evidence of mitral valve stenosis. Tricuspid  Valve: The tricuspid valve is normal in structure. Tricuspid valve regurgitation is trivial. No evidence of tricuspid stenosis. Aortic Valve: The aortic valve is normal in structure. Aortic valve regurgitation is not visualized. No aortic stenosis is present. Pulmonic Valve: The pulmonic  valve was normal in structure. Pulmonic valve regurgitation is not visualized. No evidence of pulmonic stenosis. Aorta: The aortic root is normal in size and structure. Venous: The inferior vena cava was not well visualized. The inferior vena cava is normal in size with greater than 50% respiratory variability, suggesting right atrial pressure of 3 mmHg. IAS/Shunts: No atrial level shunt detected by color flow Doppler.  LEFT VENTRICLE PLAX 2D LVIDd:         4.20 cm  Diastology LVIDs:         2.80 cm  LV e' lateral:   8.49 cm/s LV PW:         1.30 cm  LV E/e' lateral: 8.1 LV IVS:        1.20 cm  LV e' medial:    6.31 cm/s LVOT diam:     1.90 cm  LV E/e' medial:  10.9 LV SV:         82 LV SV Index:   45 LVOT Area:     2.84 cm  RIGHT VENTRICLE RV S prime:     12.80 cm/s TAPSE (M-mode): 2.3 cm LEFT ATRIUM           Index       RIGHT ATRIUM           Index LA diam:      3.30 cm 1.81 cm/m  RA Area:     15.50 cm LA Vol (A2C): 61.3 ml 33.66 ml/m RA Volume:   36.60 ml  20.10 ml/m  AORTIC VALVE LVOT Vmax:   108.00 cm/s LVOT Vmean:  77.600 cm/s LVOT VTI:    0.289 m  AORTA Ao Root diam: 3.30 cm MITRAL VALVE MV Area (PHT): 3.27 cm    SHUNTS MV Decel Time: 232 msec    Systemic VTI:  0.29 m MV E velocity: 69.00 cm/s  Systemic Diam: 1.90 cm MV A velocity: 98.10 cm/s MV E/A ratio:  0.70 Armanda Magic MD Electronically signed by Armanda Magic MD Signature Date/Time: 05/12/2020/4:28:50 PM    Final    CT HEAD CODE STROKE WO CONTRAST  Result Date: 05/12/2020 CLINICAL DATA:  Code stroke. Right-sided weakness and right facial droop. EXAM: CT HEAD WITHOUT CONTRAST TECHNIQUE: Contiguous axial images were obtained from the base of the skull through the  vertex without intravenous contrast. COMPARISON:  None. FINDINGS: Brain: 1.6 by 1.7 x 2.4 cm in diameter intraparenchymal hematoma in the left thalamus with intraventricular penetration. Surrounding edema. No hydrocephalus. Chronic small-vessel ischemic changes affect the pons and cerebral hemispheric white matter elsewhere. Vascular: There is atherosclerotic calcification of the major vessels at the base of the brain. Skull: Negative Sinuses/Orbits: Inflammatory changes of the right division of the sphenoid sinus. Orbits negative. Other: None ASPECTS ([place] Stroke Program Early CT Score) - Ganglionic level infarction (caudate, lentiform nuclei, internal capsule, insula, M1-M3 cortex): 7 - Supraganglionic infarction (M4-M6 cortex): 3 Total score (0-10 with 10 being normal): 10 IMPRESSION: 1. 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 Intraventricular penetration with blood in the left lateral ventricle. No ventricular obstruction. 2. ASPECTS is 10. 3. These results were communicated to Dr. Otelia Limes at 8:57 amon 6/5/2021by text page via the [facility] messaging system. Electronically Signed   By: Paulina Fusi M.D.   On: 05/12/2020 08:58    Micro Results     Recent Results (from the past 240 hour(s))  SARS Coronavirus 2 by RT  PCR (hospital order, performed in [facility] hospital lab) Nasopharyngeal Nasopharyngeal Swab     Status: None   Collection Time: 05/27/20  3:38 PM   Specimen: Nasopharyngeal Swab  Result Value Ref Range Status   SARS Coronavirus 2 NEGATIVE NEGATIVE Final    Comment: (NOTE) SARS-CoV-2 target nucleic acids are NOT DETECTED.  The SARS-CoV-2 RNA is generally detectable in upper and lower respiratory specimens during the acute phase of infection. The lowest concentration of SARS-CoV-2 viral copies this assay can detect is 250 copies / mL. A negative result does not preclude SARS-CoV-2  infection and should not be used as the sole basis for treatment or other patient management decisions.  A negative result may occur with improper specimen collection / handling, submission of specimen other than nasopharyngeal swab, presence of viral mutation(s) within the areas targeted by this assay, and inadequate number of viral copies (<250 copies / mL). A negative result must be combined with clinical observations, patient history, and epidemiological information.  Fact Sheet for Patients:   BoilerBrush.com.cy  Fact Sheet for Healthcare Providers: https://pope.com/  This test is not yet approved or  cleared by the [place] FDA and has been authorized for detection and/or diagnosis of SARS-CoV-2 by FDA under an Emergency Use Authorization (EUA).  This EUA will remain in effect (meaning this test can be used) for the duration of the COVID-19 declaration under Section 564(b)(1) of the Act, 21 U.S.C. section 360bbb-3(b)(1), unless the authorization is terminated or revoked sooner.  Performed at [facility], [address], [place], [place] [address]   Culture, blood (routine x 2)     Status: None (Preliminary result)   Collection Time: 05/27/20  6:15 PM   Specimen: BLOOD  Result Value Ref Range Status   Specimen Description BLOOD SITE NOT SPECIFIED  Final   Special Requests   Final    BOTTLES DRAWN AEROBIC AND ANAEROBIC Blood Culture adequate volume   Culture   Final    NO GROWTH 4 DAYS Performed at [facility], [address], [place], [place] [address]    Report Status PENDING  Incomplete  Culture, blood (routine x 2)     Status: None (Preliminary result)   Collection Time: 05/27/20 10:16 PM   Specimen: BLOOD  Result Value Ref Range Status   Specimen Description BLOOD LEFT UPPER ARM  Final   Special Requests   Final    BOTTLES DRAWN AEROBIC AND ANAEROBIC Blood Culture adequate volume   Culture   Final     NO GROWTH 4 DAYS Performed at [facility], [address], [place], [place] [address]    Report Status PENDING  Incomplete  Culture, Urine     Status: Abnormal   Collection Time: 05/30/20  7:24 AM   Specimen: Urine, Clean Catch  Result Value Ref Range Status   Specimen Description URINE, CLEAN CATCH  Final   Special Requests NONE  Final   Culture (A)  Final    70,000 COLONIES/mL ESCHERICHIA COLI >=100,000 COLONIES/mL GROUP B STREP(S.AGALACTIAE)ISOLATED TESTING AGAINST S. AGALACTIAE NOT ROUTINELY PERFORMED DUE TO PREDICTABILITY OF AMP/PEN/VAN SUSCEPTIBILITY. Performed at [facility], [address] [place], [place] [address]    Report Status 06/01/2020 FINAL  Final   Organism ID, Bacteria ESCHERICHIA COLI (A)  Final      Susceptibility   Escherichia coli - MIC*    AMPICILLIN >=32 RESISTANT Resistant     CEFAZOLIN <=4 SENSITIVE Sensitive  CEFTRIAXONE <=0.25 SENSITIVE Sensitive     CIPROFLOXACIN <=0.25 SENSITIVE Sensitive     GENTAMICIN >=16 RESISTANT Resistant     IMIPENEM <=0.25 SENSITIVE Sensitive     NITROFURANTOIN <=16 SENSITIVE Sensitive     TRIMETH/SULFA >=320 RESISTANT Resistant     AMPICILLIN/SULBACTAM 16 INTERMEDIATE Intermediate     PIP/TAZO <=4 SENSITIVE Sensitive     * 70,000 COLONIES/mL ESCHERICHIA COLI    Today   Subjective    Terry Barton today has no headache,no chest abdominal pain,no new weakness tingling or numbness, feels much better wants to go home today.     Objective   Blood pressure (!) 164/57, pulse 68, temperature 97.6 F (36.4 C), temperature source Oral, resp. rate 13, height 6' (1.829 m), weight 105 kg, SpO2 100 %.   Intake/Output Summary (Last 24 hours) at 06/01/2020 1006 Last data filed at 06/01/2020 1000 Gross per 24 hour  Intake 2030 ml  Output 2100 ml  Net -70 ml    Exam  Awake Alert, No new F.N deficits, Normal affect [place].AT,PERRAL Supple Neck,No JVD, No cervical lymphadenopathy appriciated.  Symmetrical  Chest wall movement, Good air movement bilaterally, CTAB RRR,No Gallops,Rubs or new Murmurs, No Parasternal Heave +ve B.Sounds, Abd Soft, Non tender, No organomegaly appriciated, No rebound -guarding or rigidity. No Cyanosis, Clubbing or edema, No new Rash or bruise   Data Review   CBC w Diff:  Lab Results  Component Value Date   WBC 13.1 (H) 06/01/2020   HGB 11.2 (L) 06/01/2020   HCT 34.2 (L) 06/01/2020   PLT 425 (H) 06/01/2020   LYMPHOPCT 14 06/01/2020   MONOPCT 8 06/01/2020   EOSPCT 0 06/01/2020   BASOPCT 0 06/01/2020    CMP:  Lab Results  Component Value Date   NA 134 (L) 06/01/2020   K 4.2 06/01/2020   CL 104 06/01/2020   CO2 18 (L) 06/01/2020   BUN 40 (H) 06/01/2020   CREATININE 2.02 (H) 06/01/2020   PROT 7.2 06/01/2020   ALBUMIN 2.7 (L) 06/01/2020   BILITOT 0.5 06/01/2020   ALKPHOS 89 06/01/2020   AST 34 06/01/2020   ALT 38 06/01/2020  .   Total Time in preparing paper work, data evaluation and todays exam - 61 minutes  Lala Lund M.D on 06/01/2020 at 10:06 AM  [facility]   Office  [phone]

## 2020-06-01 NOTE — TOC Transition Note (Signed)
Transition of Care [facility]) - CM/SW Discharge Note   Patient Details  Name: Luverne Zerkle MRN: [id number] Date of Birth: 1956-04-22  Transition of [facility] CM/SW Contact:  Bess Kinds, RN Phone Number: [phone] 06/01/2020, 2:32 PM   Clinical Narrative:     Patient transported home via [facility]. Son picked up 3N1 from hospital to take home. No further TOC needs identified.    Final next level of care: Home/Self Care Barriers to Discharge: No Barriers Identified   Patient Goals and CMS Choice Patient states their goals for this hospitalization and ongoing recovery are:: return home with family CMS Medicare.gov Compare Post Acute Care list provided to:: Patient Choice offered to / list presented to : Adult Children  Discharge Placement                Patient to be transferred to facility by: [facility] Name of family member notified: [name] Arko Patient and family notified of of transfer: 06/01/20  Discharge Plan and Services In-house Referral: NA Discharge Planning Services: CM Consult Post Acute Care Choice: Durable Medical Equipment          DME Arranged: 3-N-1 DME Agency: [employer] Date DME Agency Contacted: 06/01/20 Time DME Agency Contacted: 1130 Representative spoke with at DME Agency: Ian Malkin HH Arranged: NA HH Agency: NA        Social Determinants of Health (SDOH) Interventions     Readmission Risk Interventions No flowsheet data found.

## 2020-06-19 ENCOUNTER — Ambulatory Visit (INDEPENDENT_AMBULATORY_CARE_PROVIDER_SITE_OTHER): Payer: Medicare Other | Admitting: Adult Health

## 2020-06-19 ENCOUNTER — Encounter: Payer: Self-pay | Admitting: Adult Health

## 2020-06-19 VITALS — BP 127/64 | HR 52 | Ht 72.0 in | Wt 220.0 lb

## 2020-06-19 DIAGNOSIS — E782 Mixed hyperlipidemia: Secondary | ICD-10-CM | POA: Diagnosis not present

## 2020-06-19 DIAGNOSIS — I69351 Hemiplegia and hemiparesis following cerebral infarction affecting right dominant side: Secondary | ICD-10-CM | POA: Diagnosis not present

## 2020-06-19 DIAGNOSIS — I10 Essential (primary) hypertension: Secondary | ICD-10-CM | POA: Diagnosis not present

## 2020-06-19 DIAGNOSIS — I610 Nontraumatic intracerebral hemorrhage in hemisphere, subcortical: Secondary | ICD-10-CM | POA: Diagnosis not present

## 2020-06-19 NOTE — Progress Notes (Signed)
[facility] [address] [place]. [place] [address] [phone]       HOSPITAL FOLLOW UP NOTE  [name] Date of Birth:  1956/01/16 Medical Record Number:  [id number]   Reason for Referral:  hospital stroke follow up    SUBJECTIVE:   CHIEF COMPLAINT:  Chief Complaint  Patient presents with  . Follow-up    [address] here for a stroke f/u.    HPI:   TerryTerry Barton a 64 y.o.malewith history of spinal stenosisassociated with right sided weakness at baseline causing him to use a wheelchair who presented to [facility] on 05/12/2020 after he developed acute onset of worsened right sided weakness, confusion and new onset slurred speech with elevated BP at 227/99.  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   Hypertensive emergency treated with Cleviprex and discharged on low-dose metoprolol and added amlodipine and hydrochlorothiazide.  LDL 110 and recommended consideration of initiating statin at follow-up due to contraindication with ICH.  Other stroke risk factors include advanced age but no prior stroke history.  Residual deficits of cognitive impairment, aphasia, saccadic dysmetria on left lateral gaze, right lower facial weakness, RUE weakness and lower extremity weakness with history of paresthesias from spinal stenosis.  Therapies initially recommended SNF but family requested return home as he is wheelchair-bound at baseline and is stable since 2015.  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  Resultant increased right arm weakness. 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   Code Stroke CT Head- Acute intraparenchymal hemorrhage in the left thalamus with measurements of 1.6 x 1.7 x 2.4 (volume = 3.4) Cm mild surrounding edema. Intraventricular penetration with blood in the left lateral ventricle. No ventricular obstruction. ASPECTS is 10.    MRI head -stable thalamic hemorrhage with mild intraventricular extension but no hydrocephalus or midline shift.  MRA head -moderate bilateral M2 stenosis CTA H&N - not ordered   2D Echo -normal ejection fraction. No cardiac source of embolism Terry Barton Virus 2 - negative  LDL - 110  HgbA1c- 5.2  No antithromboticprior to admission, now on No antithromboticgiven ICH  Therapy recommendations: SNF  Disposition:Return home - w/c bound, lived w/ son and brother Terry Barton. Disabled since 2015 - they do not desire HH or any additional equipement  He returned to ED on 05/27/2020 due to increased confusion and lack of responsiveness found to have UTI and dehydration with metabolic and toxic encephalopathy treated with IV antibiotics.  Also found to have small edema around prior hemorrhage requiring IV steroids.  He was discharged back  Today, 06/19/2020, Terry Barton is being seen for hospital follow-up accompanied by his brother. Residual deficits with right sided weakness and right facial droop with gradual improvement.  Denies residual speech difficulty, swallowing difficulty or cognitive impairment.  Wheelchair-bound at baseline.  He lives with his son who is his main caregiver.  Denies new or worsening stroke/TIA symptoms.  He is planning to establish care with PCP at the end of this month.  Blood pressure routinely monitored at home which has been stable and today's level 127/64.  No concerns at this time.    ROS:   14 system review of systems performed and negative with exception of weakness  PMH:  Past Medical History:  Diagnosis Date  . Hypertension   . Intracranial bleeding (HCC)     PSH: No past surgical history on file.  Social History:  Social History   Socioeconomic History  .  Marital status: Single    Spouse name: Not on file  . Number of children: Not on file  . Years of education: Not on file  . Highest education level: Not on file  Occupational History  .  Not on file  Tobacco Use  . Smoking status: Never Smoker  . Smokeless tobacco: Never Used  Vaping Use  . Vaping Use: Never used  Substance and Sexual Activity  . Alcohol use: Not Currently  . Drug use: Not Currently  . Sexual activity: Not Currently  Other Topics Concern  . Not on file  Social History Narrative  . Not on file   Social Determinants of Health   Financial Resource Strain:   . Difficulty of Paying Living Expenses:   Food Insecurity:   . Worried About Programme researcher, broadcasting/film/video in the Last Year:   . Barista in the Last Year:   Transportation Needs:   . Freight forwarder (Medical):   Marland Kitchen Lack of Transportation (Non-Medical):   Physical Activity:   . Days of Exercise per Week:   . Minutes of Exercise per Session:   Stress:   . Feeling of Stress :   Social Connections:   . Frequency of Communication with Friends and Family:   . Frequency of Social Gatherings with Friends and Family:   . Attends Religious Services:   . Active Member of Clubs or Organizations:   . Attends Banker Meetings:   Marland Kitchen Marital Status:   Intimate Partner Violence:   . Fear of Current or Ex-Partner:   . Emotionally Abused:   Marland Kitchen Physically Abused:   . Sexually Abused:     Family History:  Family History  Family history unknown: Yes    Medications:   Current Outpatient Medications on File Prior to Visit  Medication Sig Dispense Refill  . acetaminophen (TYLENOL) 500 MG tablet Take 1,000 mg by mouth every 6 (six) hours as needed.    Marland Kitchen amLODipine (NORVASC) 10 MG tablet Take 1 tablet (10 mg total) by mouth daily. 30 tablet 0  . Ascorbic Acid (VITAMIN C) 500 MG CAPS Take 1 capsule by mouth daily.    . cyproheptadine (PERIACTIN) 4 MG tablet Take 4 mg by mouth 3 (three) times daily.     . ferrous sulfate 325 (65 FE) MG EC tablet Take 325 mg by mouth 3 (three) times daily with meals.    . metoprolol tartrate (LOPRESSOR) 50 MG tablet Take 1 tablet (50 mg total) by mouth 2  (two) times daily. 60 tablet 0  . pantoprazole (PROTONIX) 40 MG tablet Take 1 tablet (40 mg total) by mouth daily. 30 tablet 0  . traZODone (DESYREL) 50 MG tablet Take 1 tablet (50 mg total) by mouth at bedtime as needed for sleep. 30 tablet 0   No current facility-administered medications on file prior to visit.    Allergies:  No Known Allergies    OBJECTIVE:  Physical Exam  Vitals:   06/19/20 1452  BP: (!) 127/4  Pulse: (!) 52  Weight: 220 lb (99.8 kg)  Height: 6' (1.829 m)   Body mass index is 29.84 kg/m. No exam data present  General: well developed, well nourished,  pleasant middle-aged African-American male, seated, in no evident distress Head: head normocephalic and atraumatic.   Neck: supple with no carotid or supraclavicular bruits Cardiovascular: regular rate and rhythm, no murmurs Musculoskeletal: no deformity Skin:  no rash/petichiae Vascular:  Normal pulses all extremities   Neurologic  Exam Mental Status: Awake and fully alert.   Fluent speech and language.  Oriented to place and time. Recent and remote memory intact. Attention span, concentration and fund of knowledge appropriate. Mood and affect appropriate.  Cranial Nerves: Fundoscopic exam reveals sharp disc margins. Pupils equal, briskly reactive to light. Extraocular movements full without nystagmus. Visual fields full to confrontation. Hearing intact. Facial sensation intact.  Mild right lower facial weakness. tongue, and palate moves normally and symmetrically.  Motor:  RUE: 3/5 with moderate weakness right grip and increased tone LUE: 5/5 Bilateral paraparesis RLE 1/5 and LLE 2/5 Sensory.: intact to touch , pinprick , position and vibratory sensation.  Coordination: Rapid alternating movements normal in all extremities except decreased right hand. Finger-to-nose performed accurately LUE and heel-to-shin unable to perform due to paraparesis. Gait and Station: Deferred Reflexes: 1+ and symmetric. Toes  downgoing.     NIHSS  10 (score increased due to baseline paraparesis) Modified Rankin  3-4      ASSESSMENT: Terry Barton is a 64 y.o. year old male presented with worsening right-sided weakness, confusion and slurred speech on 05/12/2020 with stroke work-up revealing acute left thalamic hemorrhage likely secondary to uncontrolled HTN with hypertensive emergency upon arrival. Vascular risk factors include uncontrolled HTN, HLD and advanced age.  History of spinal stenosis with resultant paraparesis     PLAN:  1. Left thalamic ICH:  -Residual deficit: Right hemiparesis.  Order placed for home health PT/OT.  Reported during admission son refused home health but was under the impression this was for nursing service which was not needed. -No indication to initiate antithrombotic as no prior stroke history or cardiovascular history.  Advised to avoid aspirin, aspirin-containing products and ibuprofen products.   -Maintain strict control of hypertension with blood pressure goal below 130/90, diabetes with hemoglobin A1c goal below 6.5% and cholesterol with LDL cholesterol (bad cholesterol) goal below 70 mg/dL.  I also advised the patient to eat a healthy diet with plenty of whole grains, cereals, fruits and vegetables, exercise regularly with at least 30 minutes of continuous activity daily and maintain ideal body weight. 2. HTN: BP goal<130/90.  Stable.  Continue current regimen and ongoing follow-up with PCP for monitoring and management 3. HLD: LDL goal<70.  Recent LDL 110.  Advised him to speak to PCP in regards to repeating lipid panel at follow-up visit and if LDL remains above goal to initiate statin for secondary stroke prevention.    Follow up in 3 months or call earlier if needed   I spent 45 minutes of face-to-face and non-face-to-face time with patient and son.  This included previsit chart review, lab review, study review, order entry, electronic health record documentation,  patient education regarding recent stroke, residual deficits, importance of managing stroke risk factors and answered all questions to patient satisfaction     Ihor Austin, [facility]  [facility] [address] [place], [place] [address]  Phone [phone] Fax [phone] Note: This document was prepared with digital dictation and possible smart phrase technology. Any transcriptional errors that result from this process are unintentional.

## 2020-06-19 NOTE — Progress Notes (Signed)
I agree with the above plan 

## 2020-06-19 NOTE — Patient Instructions (Signed)
Order placed to start home health physical and occupational therapy for further recovery  Continue to follow up with PCP regarding cholesterol and blood pressure management   When you have follow-up with your new PCP, please ensure cholesterol levels are checked and if LDL<70, would recommend initiating cholesterol medication for stroke prevention  Continue to monitor blood pressure at home  Maintain strict control of hypertension with blood pressure goal below 130/90, diabetes with hemoglobin A1c goal below 6.5% and cholesterol with LDL cholesterol (bad cholesterol) goal below 70 mg/dL. I also advised the patient to eat a healthy diet with plenty of whole grains, cereals, fruits and vegetables, exercise regularly and maintain ideal body weight.  Followup in the future with me in 3 months or call earlier if needed       Thank you for coming to see [place] at [facility]. I hope we have been able to provide you high quality care today.  You may receive a patient satisfaction survey over the next few weeks. We would appreciate your feedback and comments so that we may continue to improve ourselves and the health of our patients.

## 2020-10-02 ENCOUNTER — Ambulatory Visit (INDEPENDENT_AMBULATORY_CARE_PROVIDER_SITE_OTHER): Payer: Medicare Other | Admitting: Adult Health

## 2020-10-02 ENCOUNTER — Other Ambulatory Visit: Payer: Self-pay

## 2020-10-02 ENCOUNTER — Encounter: Payer: Self-pay | Admitting: Adult Health

## 2020-10-02 VITALS — BP 138/70 | HR 50

## 2020-10-02 DIAGNOSIS — I69351 Hemiplegia and hemiparesis following cerebral infarction affecting right dominant side: Secondary | ICD-10-CM

## 2020-10-02 DIAGNOSIS — I10 Essential (primary) hypertension: Secondary | ICD-10-CM

## 2020-10-02 DIAGNOSIS — E782 Mixed hyperlipidemia: Secondary | ICD-10-CM

## 2020-10-02 DIAGNOSIS — I610 Nontraumatic intracerebral hemorrhage in hemisphere, subcortical: Secondary | ICD-10-CM

## 2020-10-02 NOTE — Progress Notes (Signed)
[facility] [address] [place]. [place] [address] [phone]       STROKE FOLLOW UP NOTE  [name] Date of Birth:  [birth year] Medical Record Number:  [id number]   Reason for Referral: stroke follow up    SUBJECTIVE:   CHIEF COMPLAINT:  Chief Complaint  Patient presents with  . Follow-up    tx rm  . Cerebrovascular Accident    pt has no new sx    HPI:   Today, 10/02/2020, Terry Barton returns for stroke follow-up accompanied by his son. Reports residual right-sided weakness with improvement since prior visit. PCP referred to PT yesterday. Denies new or worsening stroke/TIA symptoms. Blood pressure today initially elevated but on recheck 138/70.  Monitors at home and typically stable at 130s/80s remaining on amlodipine and metoprolol per PCP. Recent lipid panel by PCP and was initiated on atorvastatin 20 mg daily tolerating well without side effects. Was recently started on levothyroxine for hypothyroidism per PCP. A1c satisfactory and able to d/c his diabetic medication.  No concerns at this time.    History provided for reference purposes only Initial visit 06/19/2020 JM: Terry Barton is being seen for hospital follow-up accompanied by his son. Residual deficits with right sided weakness and right facial droop with gradual improvement.  Denies residual speech difficulty, swallowing difficulty or cognitive impairment.  Wheelchair-bound at baseline.  He lives with his son who is his main caregiver.  Denies new or worsening stroke/TIA symptoms.  He is planning to establish care with PCP at the end of this month.  Blood pressure routinely monitored at home which has been stable and today's level 127/64.  No concerns at this time.  Stroke admission 05/12/2020 Terry Barton a 64 y.o.malewith history of spinal stenosisassociated with right sided weakness at baseline causing him to use a wheelchair who presented to [facility] on 05/12/2020 after he developed acute  onset of worsened right sided weakness, confusion and new onset slurred speech with elevated BP at 227/99.  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   Hypertensive emergency treated with Cleviprex and discharged on low-dose metoprolol and added amlodipine and hydrochlorothiazide.  LDL 110 and recommended consideration of initiating statin at follow-up due to contraindication with ICH.  Other stroke risk factors include advanced age but no prior stroke history.  Residual deficits of cognitive impairment, aphasia, saccadic dysmetria on left lateral gaze, right lower facial weakness, RUE weakness and lower extremity weakness with history of paresthesias from spinal stenosis.  Therapies initially recommended SNF but family requested return home as he is wheelchair-bound at baseline and is stable since 2015.  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  Resultant increased right arm weakness. 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   Code Stroke CT Head- Acute intraparenchymal hemorrhage in the left thalamus with measurements of 1.6 x 1.7 x 2.4 (volume = 3.4) Cm mild surrounding edema. Intraventricular penetration with blood in the left lateral ventricle. No ventricular obstruction. ASPECTS is 10.   MRI head -stable thalamic hemorrhage with mild intraventricular extension but no hydrocephalus or midline shift.  MRA head -moderate bilateral M2 stenosis CTA H&N - not ordered   2D Echo -normal ejection fraction. No cardiac source of embolism Terry Barton Virus 2 - negative  LDL - 110  HgbA1c- 5.2  No antithromboticprior to admission, now on No antithromboticgiven ICH  Therapy recommendations: SNF  Disposition:Return home - w/c bound, lived w/ son and brother Terry Barton. Disabled  since 2015 - they do not desire HH or any additional equipement  He returned to ED on 05/27/2020 due to increased  confusion and lack of responsiveness found to have UTI and dehydration with metabolic and toxic encephalopathy treated with IV antibiotics.  Also found to have small edema around prior hemorrhage requiring IV steroids.  He was discharged back     ROS:   14 system review of systems performed and negative with exception of weakness  PMH:  Past Medical History:  Diagnosis Date  . Hypertension   . Intracranial bleeding (HCC)     PSH: No past surgical history on file.  Social History:  Social History   Socioeconomic History  . Marital status: Single    Spouse name: Not on file  . Number of children: Not on file  . Years of education: Not on file  . Highest education level: Not on file  Occupational History  . Not on file  Tobacco Use  . Smoking status: Never Smoker  . Smokeless tobacco: Never Used  Vaping Use  . Vaping Use: Never used  Substance and Sexual Activity  . Alcohol use: Not Currently  . Drug use: Not Currently  . Sexual activity: Not Currently  Other Topics Concern  . Not on file  Social History Narrative  . Not on file   Social Determinants of Health   Financial Resource Strain:   . Difficulty of Paying Living Expenses: Not on file  Food Insecurity:   . Worried About Programme researcher, broadcasting/film/video in the Last Year: Not on file  . Ran Out of Food in the Last Year: Not on file  Transportation Needs:   . Lack of Transportation (Medical): Not on file  . Lack of Transportation (Non-Medical): Not on file  Physical Activity:   . Days of Exercise per Week: Not on file  . Minutes of Exercise per Session: Not on file  Stress:   . Feeling of Stress : Not on file  Social Connections:   . Frequency of Communication with Friends and Family: Not on file  . Frequency of Social Gatherings with Friends and Family: Not on file  . Attends Religious Services: Not on file  . Active Member of Clubs or Organizations: Not on file  . Attends Banker Meetings: Not on file   . Marital Status: Not on file  Intimate Partner Violence:   . Fear of Current or Ex-Partner: Not on file  . Emotionally Abused: Not on file  . Physically Abused: Not on file  . Sexually Abused: Not on file    Family History:  Family History  Family history unknown: Yes    Medications:   Current Outpatient Medications on File Prior to Visit  Medication Sig Dispense Refill  . acetaminophen (TYLENOL) 500 MG tablet Take 1,000 mg by mouth every 6 (six) hours as needed.    Marland Kitchen amLODipine (NORVASC) 10 MG tablet Take 1 tablet (10 mg total) by mouth daily. 30 tablet 0  . atorvastatin (LIPITOR) 20 MG tablet Take 20 mg by mouth daily.    . Baclofen 5 MG TABS Take by mouth.    . Cholecalciferol (VITAMIN D3) 50 MCG (2000 UT) TABS Take by mouth.    Marland Kitchen LEVOTHYROXINE SODIUM PO Take 50 mcg by mouth.    . metoprolol tartrate (LOPRESSOR) 50 MG tablet Take 1 tablet (50 mg total) by mouth 2 (two) times daily. 60 tablet 0  . potassium chloride 20 MEQ/100ML IVPB Inject 20 mEq  into the vein once.    . traZODone (DESYREL) 50 MG tablet Take 1 tablet (50 mg total) by mouth at bedtime as needed for sleep. 30 tablet 0   No current facility-administered medications on file prior to visit.    Allergies:  No Known Allergies    OBJECTIVE:  Physical Exam  Vitals:   10/02/20 0848  BP: 138/70  Pulse: (!) 50   There is no height or weight on file to calculate BMI. No exam data present  General: well developed, well nourished,  pleasant middle-aged African-American male, seated, in no evident distress Head: head normocephalic and atraumatic.   Neck: supple with no carotid or supraclavicular bruits Cardiovascular: regular rate and rhythm, no murmurs Musculoskeletal: no deformity Skin:  no rash/petichiae Vascular:  Normal pulses all extremities   Neurologic Exam Mental Status: Awake and fully alert. Fluent speech and language. Oriented to place and time. Recent and remote memory intact. Attention span,  concentration and fund of knowledge appropriate. Mood and affect appropriate.  Cranial Nerves: Pupils equal, briskly reactive to light. Extraocular movements full without nystagmus. Visual fields full to confrontation. Hearing intact. Facial sensation intact.  Mild right lower facial weakness. tongue, and palate moves normally and symmetrically.  Motor:  RUE: 4/5 deltoid, shoulder flexion and extension, minimal handgrip with increased tone throughout LUE: 5/5 Bilateral paraparesis RLE 2/5 and LLE 3/5 Sensory.: intact to touch , pinprick , position and vibratory sensation.  Coordination: Rapid alternating movements normal in all extremities except decreased right hand. Finger-to-nose performed accurately LUE and heel-to-shin unable to perform due to paraparesis. Gait and Station: Deferred as patient nonambulatory Reflexes: 1+ and symmetric. Toes downgoing.       ASSESSMENT/PLAN: Terry Barton is a 64 y.o. year old male presented with worsening right-sided weakness, confusion and slurred speech on 05/12/2020 with stroke work-up revealing acute left thalamic hemorrhage likely secondary to uncontrolled HTN with hypertensive emergency upon arrival. Vascular risk factors include uncontrolled HTN, HLD and advanced age.  History of spinal stenosis with resultant paraparesis    1. Left thalamic ICH:  a. Residual deficit: Right hemiparesis with improvement since prior visit.  PCP recently referred to outpatient PT. b. No indication to initiate antithrombotic as no prior stroke history or cardiovascular history.  Advised to avoid aspirin, aspirin-containing products and ibuprofen products.   c. Continue atorvastatin 20 mg daily d. Discussed secondary stroke prevention measures and importance of close PCP follow-up for aggressive stroke risk factor management 2. HTN: BP goal<130/90.  Stable.  On amlodipine and metoprolol per PCP.  3. HLD: LDL goal<70.  Recent LDL 110. Per son, PCP recently started patient  on atorvastatin 20 mg daily.  Advised to continue and ongoing follow-up with PCP for monitoring and management.   Follow up in 6 months or call earlier if needed  CC:  [facility] provider: Dr. Gray Bernhardt, Selena Batten, NP    I spent 30 minutes of face-to-face and non-face-to-face time with patient and son.  This included previsit chart review, lab review, study review, order entry, electronic health record documentation, patient education regarding L thalamic ICH and etiology, residual deficits, importance of managing stroke risk factors and answered all questions to patient and sons satisfaction    Ihor Austin, AGNP-BC  [facility] [address] [place], [place] [address]  Phone [phone] Fax [phone] Note: This document was prepared with digital dictation and possible smart phrase technology. Any transcriptional errors that result from this process are unintentional.

## 2020-10-02 NOTE — Patient Instructions (Addendum)
Please continue to follow with your PCP regarding monitoring of blood pressure and cholesterol levels. If your LDL or bad cholesterol is greater than 70, would recommend initating cholesterol lowering medication for secondary stroke prevention  Start working with therapy for hopeful further improvement - keep up the good work!!     Followup in the future with me in 6 months or call earlier if needed       Thank you for coming to see [place] at [facility]. I hope we have been able to provide you high quality care today.  You may receive a patient satisfaction survey over the next few weeks. We would appreciate your feedback and comments so that we may continue to improve ourselves and the health of our patients.   Hemorrhagic Stroke  A hemorrhagic stroke is the sudden death of brain tissue that occurs when a blood vessel in the brain leaks or bursts (ruptures), causing bleeding in or around the brain(hemorrhage). When this happens, areas of the brain do not get enough oxygen, and blood builds up and presses on areas of the brain. Lack of oxygen and pressure from hemorrhaging can lead to brain damage and death. There are two major types of hemorrhagic stroke:  Intracerebral hemorrhage. This happens if bleeding occurs within the brain tissue.  Subarachnoid hemorrhage. This happens if bleeding occurs in the area between the brain and the membrane that covers the brain (subarachnoid space). Hemorrhagic stroke is a medical emergency. It can cause temporary or permanent brain damage and loss of brain function. What are the causes? This condition is caused by a blood vessel leaking or rupturing, which may be the result of:  Part of a weakened blood vessel wall bulging or ballooning out (cerebral aneurysm).  A hardened, thin blood vessel cracking open and allowing blood to leak out. Blood vessels may become hardened and thin due to plaque buildup.  Tangled blood vessels in  the brain (brain arteriovenous malformation).  Protein buildup in artery walls in the brain (amyloid angiopathy).  Inflamed blood vessels (vasculitis).  A tumor in the brain.  High blood pressure (hypertension). What increases the risk? The following factors may make you more likely to develop this condition:  Hypertension.  Having abnormal blood vessels present since birth (congenital abnormality).  Bleeding disorders, such as hemophilia, sickle cell disease, or liver disease.  The blood becoming too thin while taking blood thinners (anticoagulants).  Aging.  Moderate or heavy alcohol use.  Using drugs, such as cocaine or methamphetamines. What are the signs or symptoms? Symptoms of this condition usually appear suddenly, and may include:  Weakness or numbness of the face, arm, or leg, especially on one side of the body.  Confusion.  Difficulty speaking (aphasia) or understanding speech.  Difficulty seeing out of one or both eyes.  Difficulty walking or moving the arms or legs.  Dizziness.  Loss of balance or coordination.  Seizures.  A severe headache with no known cause. This headache may feel like the worst headache a person has ever experienced. How is this diagnosed? This condition may be diagnosed based on:  Your symptoms.  Your medical history.  A physical exam.  Tests, including: ? Blood tests. ? CT scan. ? MRI. ? CT angiography (CTA) or magnetic resonance angiography (MRA).  Catheter angiogram. In this procedure, dye is injected through a long, thin tube (catheter) into one of your arteries. Then, X-rays are taken. The X-rays will show whether there is a blockage or a problem  in a blood vessel. How is this treated? This condition is a medical emergency that must be treated in a hospital immediately. The goals of treatment are to stop bleeding, reduce pressure on the brain, relieve symptoms, and prevent complications. Treatment for this condition  may include:  Medicines that: ? Lower blood pressure (antihypertensives). ? Relieve pain (analgesics). ? Relieve nausea or vomiting. ? Stop or prevent seizures (anticonvulsants). ? Relieve fever. ? Prevent blood vessels in the brain from spasming in response to bleeding. ? Control bleeding in the brain.  Assisted breathing (ventilation). This involves using a machine to help you breathe (ventilator).  Receiving donated blood products through an IV (transfusion). You will receive cells that help your blood clot.  Placement of a tube (shunt) in the brain to relieve pressure.  Physical, speech, or occupational therapy.  Surgery to stop bleeding, remove a blood clot or tumor, or reduce pressure. Treatment depends on the cause, severity, and duration of symptoms. Medicines and changes to your diet may be used to help treat and manage risk factors for stroke, such as diabetes and high blood pressure. Recovery from hemorrhagic stroke varies widely. Talk with your health care provider about what to expect during your recovery. Follow these instructions at home: Activity  Use a walker or a cane as told by your health care provider.  Return to your normal activities as told by your health care provider. Ask your health care provider what activities are safe for you.  Rest. Rest helps the brain to heal. Make sure you: ? Get plenty of sleep. Avoid staying up late at night. ? Keep a consistent sleep schedule. Try to go to sleep and wake up at about the same time every day. ? Avoid activities that cause physical or mental stress. Lifestyle  Do not drink alcohol if: ? Your health care provider tells you not to drink. ? You are pregnant, may be pregnant, or are planning to become pregnant.  If you drink alcohol: ? Limit how much you use to:  0-1 drink a day for women.  0-2 drinks a day for men. ? Be aware of how much alcohol is in your drink. In the [place] one drink equals one 12 oz bottle  of beer (355 mL), one 5 oz glass of wine (148 mL), or one 1 oz glass of hard liquor (44 mL). General instructions  Do not drive or use heavy machinery until your health care provider approves.  Take over-the-counter and prescription medicines only as told by your health care provider.  Keep all follow-up visits as told by your health care provider, including visits with therapists. This is important. How is this prevented? Your risk of stroke can be decreased by working with your health care provider to treat:  High blood pressure.  High cholesterol.  Diabetes.  Heart disease.  Obesity. Your risk of stroke can also be decreased by quitting smoking, limiting alcohol, and staying physically active. If you take the blood thinner warfarin, have your bloodwork monitored frequently by your health care provider. Contact a health care provider if: You develop any of the following symptoms:  Headaches that keep coming back (chronic headaches).  Nausea.  Vision problems.  Increased sensitivity to noise or light.  Depression or mood swings.  Anxiety or irritability.  Memory problems.  Difficulty concentrating or paying attention.  Sleep problems.  Feeling tired all of the time. Get help right away if you:  Have a partial or total loss of consciousness.  Are taking blood thinners and you fall or you experience minor injury to the head.  Have a bleeding disorder and you fall or you experience minor trauma to the head.  Have any symptoms of a stroke. "BE FAST" is an easy way to remember the main warning signs of a stroke: ? B - Balance. Signs are dizziness, sudden trouble walking, or loss of balance. ? E - Eyes. Signs are trouble seeing or a sudden change in vision. ? F - Face. Signs are sudden weakness or numbness of the face, or the face or eyelid drooping on one side. ? A - Arms. Signs are weakness or numbness in an arm. This happens suddenly and usually on one side of  the body. ? S - Speech. Signs are sudden trouble speaking, slurred speech, or trouble understanding what people say. ? T - Time. Time to call emergency services. Write down what time symptoms started.  Have other signs of a stroke, such as: ? A sudden, severe headache with no known cause. ? Nausea or vomiting. ? Seizure. These symptoms may represent a serious problem that is an emergency. Do not wait to see if the symptoms will go away. Get medical help right away. Call your local emergency services (911 in the [place] Do not drive yourself to the hospital. Summary  Hemorrhagic stroke is a medical emergency.  Hemorrhagic stroke is caused by bleeding in or around the brain.  Know the signs and symptoms of stroke.  Know your stroke risk factors. Work with your health care provider to decrease your risk of stroke. This information is not intended to replace advice given to you by your health care provider. Make sure you discuss any questions you have with your health care provider. Document Revised: 12/30/2018 Document Reviewed: 12/31/2018 Elsevier Patient Education  2020 [employer].

## 2020-10-03 NOTE — Progress Notes (Signed)
I agree with the above plan 

## 2021-03-22 ENCOUNTER — Other Ambulatory Visit: Payer: Self-pay

## 2021-03-22 ENCOUNTER — Emergency Department (HOSPITAL_BASED_OUTPATIENT_CLINIC_OR_DEPARTMENT_OTHER): Payer: Medicare Other

## 2021-03-22 ENCOUNTER — Emergency Department (HOSPITAL_BASED_OUTPATIENT_CLINIC_OR_DEPARTMENT_OTHER)
Admission: EM | Admit: 2021-03-22 | Discharge: 2021-03-23 | Disposition: A | Discharge status: Home / Self Care | Payer: Medicare Other | Attending: Emergency Medicine | Admitting: Emergency Medicine

## 2021-03-22 ENCOUNTER — Encounter (HOSPITAL_BASED_OUTPATIENT_CLINIC_OR_DEPARTMENT_OTHER): Payer: Self-pay | Admitting: Emergency Medicine

## 2021-03-22 DIAGNOSIS — M542 Cervicalgia: Secondary | ICD-10-CM | POA: Insufficient documentation

## 2021-03-22 DIAGNOSIS — Z79899 Other long term (current) drug therapy: Secondary | ICD-10-CM | POA: Diagnosis not present

## 2021-03-22 DIAGNOSIS — I129 Hypertensive chronic kidney disease with stage 1 through stage 4 chronic kidney disease, or unspecified chronic kidney disease: Secondary | ICD-10-CM | POA: Diagnosis not present

## 2021-03-22 DIAGNOSIS — N183 Chronic kidney disease, stage 3 unspecified: Secondary | ICD-10-CM | POA: Diagnosis not present

## 2021-03-22 DIAGNOSIS — R519 Headache, unspecified: Secondary | ICD-10-CM | POA: Insufficient documentation

## 2021-03-22 HISTORY — DX: Cerebral infarction, unspecified: I63.9

## 2021-03-22 LAB — BASIC METABOLIC PANEL
Anion gap: 10 (ref 5–15)
BUN: 19 mg/dL (ref 8–23)
CO2: 26 mmol/L (ref 22–32)
Calcium: 8.5 mg/dL — ABNORMAL LOW (ref 8.9–10.3)
Chloride: 104 mmol/L (ref 98–111)
Creatinine, Ser: 1.5 mg/dL — ABNORMAL HIGH (ref 0.61–1.24)
GFR, Estimated: 51 mL/min — ABNORMAL LOW (ref >60)
Glucose, Bld: 124 mg/dL — ABNORMAL HIGH (ref 70–99)
Potassium: 3.2 mmol/L — ABNORMAL LOW (ref 3.5–5.1)
Sodium: 140 mmol/L (ref 135–145)

## 2021-03-22 LAB — CBC
HCT: 34.5 % — ABNORMAL LOW (ref 39.0–52.0)
Hemoglobin: 11.2 g/dL — ABNORMAL LOW (ref 13.0–17.0)
MCH: 29.2 pg (ref 26.0–34.0)
MCHC: 32.5 g/dL (ref 30.0–36.0)
MCV: 90.1 fL (ref 80.0–100.0)
Platelets: 358 10*3/uL (ref 150–400)
RBC: 3.83 MIL/uL — ABNORMAL LOW (ref 4.22–5.81)
RDW: 15.4 % (ref 11.5–15.5)
WBC: 10.3 10*3/uL (ref 4.0–10.5)
nRBC: 0 % (ref 0.0–0.2)

## 2021-03-22 MED ORDER — MORPHINE SULFATE (PF) 4 MG/ML IV SOLN
4.0000 mg | Freq: Once | INTRAVENOUS | Status: AC
  Administered 2021-03-22: 4 mg via INTRAVENOUS
  Filled 2021-03-22: qty 1

## 2021-03-22 MED ORDER — PREDNISONE 20 MG PO TABS
40.0000 mg | ORAL_TABLET | Freq: Every day | ORAL | 0 refills | Status: AC
Start: 2021-03-22 — End: 2021-03-27

## 2021-03-22 MED ORDER — HYDROCODONE-ACETAMINOPHEN 5-325 MG PO TABS: 1.0000 | ORAL_TABLET | Freq: Four times a day (QID) | ORAL | 0 refills | Status: DC | PRN

## 2021-03-22 NOTE — ED Notes (Signed)
Waiting for transportation. Pt denies any needs.

## 2021-03-22 NOTE — ED Provider Notes (Signed)
[facility] EMERGENCY DEPT Provider Note   CSN: [id number] Arrival date & time: 03/22/21  1532     History Chief Complaint  Patient presents with  . Neck Pain    Terry Barton is a 65 y.o. male.  HPI   Patient presents to the ED with complaints of neck pain.  Patient states the symptoms have been ongoing for couple of weeks now.  The pain increases whenever he tries to move his neck.  Pain stays in the back of his neck as well as his posterior head.  Pain does not radiate.  He is not having any fevers or chills.  No focal numbness or weakness.  Patient states he saw his doctor the other day and he was given prescriptions.  He is not sure what those medications are.  They have not helped although he just started taking them yesterday.  Patient at baseline is not ambulatory.  He had an intracerebral hemorrhage and stroke back in 2021 although the patient tells me he has been bedridden for several years now.  Past Medical History:  Diagnosis Date  . Hypertension   . Intracranial bleeding (HCC)   . Stroke [facility])     Patient Active Problem List   Diagnosis Date Noted  . Acute encephalopathy 05/28/2020  . ARF (acute renal failure) (HCC) 05/27/2020  . Essential hypertension 05/17/2020  . Hyperlipidemia 05/17/2020  . CKD (chronic kidney disease), stage III (HCC) 05/17/2020  . Hypokalemia 05/17/2020  . ICH (intracerebral hemorrhage) (HCC) d/t HTN 05/12/2020    No past surgical history on file.     Family History  Family history unknown: Yes    Social History   Tobacco Use  . Smoking status: Never Smoker  . Smokeless tobacco: Never Used  Vaping Use  . Vaping Use: Never used  Substance Use Topics  . Alcohol use: Not Currently  . Drug use: Not Currently    Home Medications Prior to Admission medications   Medication Sig Start Date End Date Taking? Authorizing Provider  HYDROcodone-acetaminophen (NORCO/VICODIN) 5-325 MG tablet Take 1 tablet by mouth every 6  (six) hours as needed. 03/22/21  Yes Linwood Dibbles, MD  predniSONE (DELTASONE) 20 MG tablet Take 2 tablets (40 mg total) by mouth daily for 5 days. 03/22/21 03/27/21 Yes Linwood Dibbles, MD  acetaminophen (TYLENOL) 500 MG tablet Take 1,000 mg by mouth every 6 (six) hours as needed.    [provider]  amLODipine (NORVASC) 10 MG tablet Take 1 tablet (10 mg total) by mouth daily. 06/01/20   Leroy Sea, MD  atorvastatin (LIPITOR) 20 MG tablet Take 20 mg by mouth daily.    [provider]  Baclofen 5 MG TABS Take by mouth.    [provider]  Cholecalciferol (VITAMIN D3) 50 MCG (2000 UT) TABS Take by mouth.    [provider]  LEVOTHYROXINE SODIUM PO Take 50 mcg by mouth.    [provider]  metoprolol tartrate (LOPRESSOR) 50 MG tablet Take 1 tablet (50 mg total) by mouth 2 (two) times daily. 06/01/20   Leroy Sea, MD  potassium chloride 20 MEQ/100ML IVPB Inject 20 mEq into the vein once.    [provider]  traZODone (DESYREL) 50 MG tablet Take 1 tablet (50 mg total) by mouth at bedtime as needed for sleep. 06/01/20   Leroy Sea, MD    Allergies    Patient has no known allergies.  Review of Systems   Review of Systems  All  other systems reviewed and are negative.   Physical Exam Updated Vital Signs BP 134/65   Pulse (!) 49   Temp 98.2 F (36.8 C) (Oral)   Resp 18   Ht 1.829 m (6')   Wt 102.1 kg   SpO2 100%   BMI 30.52 kg/m   Physical Exam Vitals and nursing note reviewed.  Constitutional:      General: He is not in acute distress.    Appearance: He is well-developed.  HENT:     Head: Normocephalic and atraumatic.     Right Ear: External ear normal.     Left Ear: External ear normal.  Eyes:     General: No scleral icterus.       Right eye: No discharge.        Left eye: No discharge.     Conjunctiva/sclera: Conjunctivae normal.  Neck:     Trachea: No tracheal deviation.  Cardiovascular:     Rate and Rhythm:  Normal rate and regular rhythm.  Pulmonary:     Effort: Pulmonary effort is normal. No respiratory distress.     Breath sounds: Normal breath sounds. No stridor. No wheezing or rales.  Abdominal:     General: Bowel sounds are normal. There is no distension.     Palpations: Abdomen is soft.     Tenderness: There is no abdominal tenderness. There is no guarding or rebound.  Musculoskeletal:     Cervical back: Neck supple. Tenderness and bony tenderness present. No crepitus. Pain with movement present.     Thoracic back: Normal.     Lumbar back: Normal.  Skin:    General: Skin is warm and dry.     Findings: No rash.  Neurological:     Mental Status: He is alert.     Cranial Nerves: No cranial nerve deficit (no facial droop, extraocular movements intact, no slurred speech).     Sensory: No sensory deficit.     Motor: No abnormal muscle tone or seizure activity.     Coordination: Coordination normal.     ED Results / Procedures / Treatments   Labs (all labs ordered are listed, but only abnormal results are displayed) Labs Reviewed  CBC - Abnormal; Notable for the following components:      Result Value   RBC 3.83 (*)    Hemoglobin 11.2 (*)    HCT 34.5 (*)    All other components within normal limits  BASIC METABOLIC PANEL - Abnormal; Notable for the following components:   Potassium 3.2 (*)    Glucose, Bld 124 (*)    Creatinine, Ser 1.50 (*)    Calcium 8.5 (*)    GFR, Estimated 51 (*)    All other components within normal limits    EKG None  Radiology CT Head Wo Contrast  Result Date: 03/22/2021 CLINICAL DATA:  Headache and neck pain. EXAM: CT HEAD WITHOUT CONTRAST CT CERVICAL SPINE WITHOUT CONTRAST TECHNIQUE: Multidetector CT imaging of the head and cervical spine was performed following the standard protocol without intravenous contrast. Multiplanar CT image reconstructions of the cervical spine were also generated. COMPARISON:  05/29/2020 FINDINGS: CT HEAD FINDINGS  Brain: The brainstem, cerebellum, cerebral peduncles, thalami, basal ganglia, basilar cisterns, and ventricular system appear within normal limits. Periventricular white matter and corona radiata hypodensities favor chronic ischemic microvascular white matter disease. No intracranial hemorrhage, mass lesion, or acute CVA. Vascular: Unremarkable Skull: Unremarkable Sinuses/Orbits: Complete opacification of the right sphenoid sinus compatible with chronic or acute sinusitis. Small  right mastoid effusion. Other: No supplemental non-categorized findings. CT CERVICAL SPINE FINDINGS Alignment: Mild reversal the normal cervical lordosis. No significant subluxation identified. Skull base and vertebrae: There is solid fusion of enthesis fights extending from C2 down through T1 anterior to the vertebral body column. No fracture is observed. Posterior decompression extending from C3-4 through C6-7. Annular ligament calcification posterior to the odontoid with a small amount of erosion along the odontoid. Soft tissues and spinal canal: Unremarkable Disc levels: There is bony osseous foraminal stenosis on the left at C2-3, C3-4, and C5-6 due to intervertebral and facet spurring. There is some posterior interbody spurring at C5-6 and C6-7 but with posterior decompression at these levels. Upper chest: Unremarkable Other: No supplemental non-categorized findings. IMPRESSION: 1. No acute intracranial findings. 2. Solid bridging anterior interbody osteophytes from C2 through T1, no fracture identified. Prior surgical posterior decompression extending from C3-4 through C6-7. 3. Left osseous foraminal narrowing at C2-3, C3-4, and C5-6 due to spurring. 4. Periventricular white matter and corona radiata hypodensities favor chronic ischemic microvascular white matter disease. Electronically Signed   By: Gaylyn Rong M.D.   On: 03/22/2021 18:06   CT Cervical Spine Wo Contrast  Result Date: 03/22/2021 CLINICAL DATA:  Headache and  neck pain. EXAM: CT HEAD WITHOUT CONTRAST CT CERVICAL SPINE WITHOUT CONTRAST TECHNIQUE: Multidetector CT imaging of the head and cervical spine was performed following the standard protocol without intravenous contrast. Multiplanar CT image reconstructions of the cervical spine were also generated. COMPARISON:  05/29/2020 FINDINGS: CT HEAD FINDINGS Brain: The brainstem, cerebellum, cerebral peduncles, thalami, basal ganglia, basilar cisterns, and ventricular system appear within normal limits. Periventricular white matter and corona radiata hypodensities favor chronic ischemic microvascular white matter disease. No intracranial hemorrhage, mass lesion, or acute CVA. Vascular: Unremarkable Skull: Unremarkable Sinuses/Orbits: Complete opacification of the right sphenoid sinus compatible with chronic or acute sinusitis. Small right mastoid effusion. Other: No supplemental non-categorized findings. CT CERVICAL SPINE FINDINGS Alignment: Mild reversal the normal cervical lordosis. No significant subluxation identified. Skull base and vertebrae: There is solid fusion of enthesis fights extending from C2 down through T1 anterior to the vertebral body column. No fracture is observed. Posterior decompression extending from C3-4 through C6-7. Annular ligament calcification posterior to the odontoid with a small amount of erosion along the odontoid. Soft tissues and spinal canal: Unremarkable Disc levels: There is bony osseous foraminal stenosis on the left at C2-3, C3-4, and C5-6 due to intervertebral and facet spurring. There is some posterior interbody spurring at C5-6 and C6-7 but with posterior decompression at these levels. Upper chest: Unremarkable Other: No supplemental non-categorized findings. IMPRESSION: 1. No acute intracranial findings. 2. Solid bridging anterior interbody osteophytes from C2 through T1, no fracture identified. Prior surgical posterior decompression extending from C3-4 through C6-7. 3. Left osseous  foraminal narrowing at C2-3, C3-4, and C5-6 due to spurring. 4. Periventricular white matter and corona radiata hypodensities favor chronic ischemic microvascular white matter disease. Electronically Signed   By: Gaylyn Rong M.D.   On: 03/22/2021 18:06    Procedures Procedures   Medications Ordered in ED Medications  morphine 4 MG/ML injection 4 mg (4 mg Intravenous Given 03/22/21 1657)    ED Course  I have reviewed the triage vital signs and the nursing notes.  Pertinent labs & imaging results that were available during my care of the patient were reviewed by me and considered in my medical decision making (see chart for details).  Clinical Course as of 03/22/21 1843  Fri Mar 22, 2021  1749 Labs reviewed. [JK]  1749 Anemia stable compared to previous values. [JK]  1749 Metabolic shows creatinine improved compared to previous values [JK]  1811 CT and C-spine CT without acute changes.  Arthritic changes noted [JK]    Clinical Course User Index [JK] Linwood DibblesKnapp, Man Effertz, MD   MDM Rules/Calculators/A&P                          Patient presented to the ED for evaluation of of neck pain.  Patient does have history of prior neck surgery.  He has been trying tramadol and Robaxin.  There has been no recent fall.  Not have any fevers or chills.  No new neurologic deficits.  Head CT and C-spine CT did not show acute findings.  No signs of hemorrhage.  No lytic lesions noted on the cervical spine.  We will try a prescription of hydrocodone and short course of steroids.  Recommend outpatient follow-up with PCP. Final Clinical Impression(s) / ED Diagnoses Final diagnoses:  Neck pain    Rx / DC Orders ED Discharge Orders         Ordered    HYDROcodone-acetaminophen (NORCO/VICODIN) 5-325 MG tablet  Every 6 hours PRN        03/22/21 1843    predniSONE (DELTASONE) 20 MG tablet  Daily        03/22/21 1843           Linwood DibblesKnapp, Murrel Freet, MD 03/22/21 1844

## 2021-03-22 NOTE — ED Triage Notes (Signed)
Pt arrives via EMS c/o neck pain. Pt reports neck surgery 5 years ago. Pt denies injury. Pt saw PCP and was given tramadol and robaxin without relief. Pt placed in c collar due to EMS carrying pt down a steep incline.

## 2021-03-22 NOTE — Discharge Instructions (Addendum)
Take medications as needed for pain.  Follow-up with your doctor next week to be rechecked if symptoms persist

## 2021-03-23 NOTE — ED Notes (Signed)
Diaper changed and ready for transport.

## 2021-03-23 NOTE — ED Notes (Signed)
[name], son has been called and updated. [facility] is here and is taking patient.

## 2021-03-23 NOTE — ED Notes (Signed)
Pt sleeping. NAD noted 

## 2021-04-02 ENCOUNTER — Ambulatory Visit: Payer: Medicare Other | Admitting: Adult Health

## 2021-04-02 ENCOUNTER — Encounter: Payer: Self-pay | Admitting: Adult Health

## 2021-04-02 NOTE — Progress Notes (Deleted)
[facility] [address] [place]. [place] [address] [phone]       STROKE FOLLOW UP NOTE  Terry Barton Date of Birth:  1956/02/10 Medical Record Number:  [id number]   Reason for Referral: stroke follow up    SUBJECTIVE:   CHIEF COMPLAINT:  No chief complaint on file.   HPI:   Today, 04/02/2021, Terry Barton returns for 67-month stroke follow-up.  Stable since prior visit without new stroke/TIA symptoms.  Residual right-sided weakness ***.  Compliant on atorvastatin 20 mg daily without associated side effects.  Blood pressure today ***.  A1c *** and remains of DM regimen.      History provided for reference purposes only Update 10/02/2020 JM: Terry Barton returns for stroke follow-up accompanied by his son. Reports residual right-sided weakness with improvement since prior visit. PCP referred to PT yesterday. Denies new or worsening stroke/TIA symptoms. Blood pressure today initially elevated but on recheck 138/70.  Monitors at home and typically stable at 130s/80s remaining on amlodipine and metoprolol per PCP. Recent lipid panel by PCP and was initiated on atorvastatin 20 mg daily tolerating well without side effects. Was recently started on levothyroxine for hypothyroidism per PCP. A1c satisfactory and able to d/c his diabetic medication.  No concerns at this time.  Initial visit 06/19/2020 JM: Terry Barton is being seen for hospital follow-up accompanied by his son. Residual deficits with right sided weakness and right facial droop with gradual improvement.  Denies residual speech difficulty, swallowing difficulty or cognitive impairment.  Wheelchair-bound at baseline.  He lives with his son who is his main caregiver.  Denies new or worsening stroke/TIA symptoms.  He is planning to establish care with PCP at the end of this month.  Blood pressure routinely monitored at home which has been stable and today's level 127/64.  No concerns at this time.  Stroke  admission 05/12/2020 Terry Barton a 65 y.o.malewith history of spinal stenosisassociated with right sided weakness at baseline causing him to use a wheelchair who presented to [facility] on 05/12/2020 after he developed acute onset of worsened right sided weakness, confusion and new onset slurred speech with elevated BP at 227/99.  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   Hypertensive emergency treated with Cleviprex and discharged on low-dose metoprolol and added amlodipine and hydrochlorothiazide.  LDL 110 and recommended consideration of initiating statin at follow-up due to contraindication with ICH.  Other stroke risk factors include advanced age but no prior stroke history.  Residual deficits of cognitive impairment, aphasia, saccadic dysmetria on left lateral gaze, right lower facial weakness, RUE weakness and lower extremity weakness with history of paresthesias from spinal stenosis.  Therapies initially recommended SNF but family requested return home as he is wheelchair-bound at baseline and is stable since 2015.  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  Resultant increased right arm weakness. 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   Code Stroke CT Head- Acute intraparenchymal hemorrhage in the left thalamus with measurements of 1.6 x 1.7 x 2.4 (volume = 3.4) Cm mild surrounding edema. Intraventricular penetration with blood in the left lateral ventricle. No ventricular obstruction. ASPECTS is 10.   MRI head -stable thalamic hemorrhage with mild intraventricular extension but no hydrocephalus or midline shift.  MRA head -moderate bilateral M2 stenosis CTA H&N - not ordered   2D Echo -normal ejection fraction. No cardiac source of embolism Loyal Jacobson Virus 2 - negative  LDL - 110  HgbA1c- 5.2  No antithromboticprior to admission, now on No antithromboticgiven  ICH  Therapy recommendations: SNF  Disposition:Return home - w/c bound, lived w/ son and brother Terry Barton. Disabled since 2015 - they do not desire HH or any additional equipement  He returned to ED on 05/27/2020 due to increased confusion and lack of responsiveness found to have UTI and dehydration with metabolic and toxic encephalopathy treated with IV antibiotics.  Also found to have small edema around prior hemorrhage requiring IV steroids.  He was discharged back     ROS:   14 system review of systems performed and negative with exception of weakness  PMH:  Past Medical History:  Diagnosis Date  . Hypertension   . Intracranial bleeding (HCC)   . Stroke [facility])     PSH: No past surgical history on file.  Social History:  Social History   Socioeconomic History  . Marital status: Single    Spouse name: Not on file  . Number of children: Not on file  . Years of education: Not on file  . Highest education level: Not on file  Occupational History  . Not on file  Tobacco Use  . Smoking status: Never Smoker  . Smokeless tobacco: Never Used  Vaping Use  . Vaping Use: Never used  Substance and Sexual Activity  . Alcohol use: Not Currently  . Drug use: Not Currently  . Sexual activity: Not Currently  Other Topics Concern  . Not on file  Social History Narrative  . Not on file   Social Determinants of Health   Financial Resource Strain: Not on file  Food Insecurity: Not on file  Transportation Needs: Not on file  Physical Activity: Not on file  Stress: Not on file  Social Connections: Not on file  Intimate Partner Violence: Not on file    Family History:  Family History  Family history unknown: Yes    Medications:   Current Outpatient Medications on File Prior to Visit  Medication Sig Dispense Refill  . acetaminophen (TYLENOL) 500 MG tablet Take 1,000 mg by mouth every 6 (six) hours as needed.    Marland Kitchen amLODipine (NORVASC) 10 MG tablet Take 1 tablet (10 mg  total) by mouth daily. 30 tablet 0  . atorvastatin (LIPITOR) 20 MG tablet Take 20 mg by mouth daily.    . Baclofen 5 MG TABS Take by mouth.    . Cholecalciferol (VITAMIN D3) 50 MCG (2000 UT) TABS Take by mouth.    Marland Kitchen HYDROcodone-acetaminophen (NORCO/VICODIN) 5-325 MG tablet Take 1 tablet by mouth every 6 (six) hours as needed. 12 tablet 0  . LEVOTHYROXINE SODIUM PO Take 50 mcg by mouth.    . metoprolol tartrate (LOPRESSOR) 50 MG tablet Take 1 tablet (50 mg total) by mouth 2 (two) times daily. 60 tablet 0  . potassium chloride 20 MEQ/100ML IVPB Inject 20 mEq into the vein once.    . traZODone (DESYREL) 50 MG tablet Take 1 tablet (50 mg total) by mouth at bedtime as needed for sleep. 30 tablet 0   No current facility-administered medications on file prior to visit.    Allergies:  No Known Allergies    OBJECTIVE:  Physical Exam  There were no vitals filed for this visit. There is no height or weight on file to calculate BMI. No exam data present  General: well developed, well nourished,  pleasant middle-aged African-American male, seated, in no evident distress Head: head normocephalic and atraumatic.   Neck: supple with no carotid  or supraclavicular bruits Cardiovascular: regular rate and rhythm, no murmurs Musculoskeletal: no deformity Skin:  no rash/petichiae Vascular:  Normal pulses all extremities   Neurologic Exam Mental Status: Awake and fully alert. Fluent speech and language. Oriented to place and time. Recent and remote memory intact. Attention span, concentration and fund of knowledge appropriate. Mood and affect appropriate.  Cranial Nerves: Pupils equal, briskly reactive to light. Extraocular movements full without nystagmus. Visual fields full to confrontation. Hearing intact. Facial sensation intact.  Mild right lower facial weakness. tongue, and palate moves normally and symmetrically.  Motor:  RUE: 4/5 deltoid, shoulder flexion and extension, minimal handgrip with  increased tone throughout LUE: 5/5 Bilateral paraparesis RLE 2/5 and LLE 3/5 Sensory.: intact to touch , pinprick , position and vibratory sensation.  Coordination: Rapid alternating movements normal in all extremities except decreased right hand. Finger-to-nose performed accurately LUE and heel-to-shin unable to perform due to paraparesis. Gait and Station: Deferred as patient nonambulatory Reflexes: 1+ and symmetric. Toes downgoing.       ASSESSMENT/PLAN: Terry Barton is a 65 y.o. year old male presented with worsening right-sided weakness, confusion and slurred speech on 05/12/2020 with stroke work-up revealing acute left thalamic hemorrhage likely secondary to uncontrolled HTN with hypertensive emergency upon arrival. Vascular risk factors include uncontrolled HTN, HLD and advanced age.  History of spinal stenosis with resultant paraparesis    1. Left thalamic ICH:  a. Residual deficit: Right hemiparesis with improvement since prior visit.  PCP recently referred to outpatient PT. b. No indication to initiate antithrombotic as no prior stroke history or cardiovascular history.  Advised to avoid aspirin, aspirin-containing products and ibuprofen products.   c. Continue atorvastatin 20 mg daily d. Discussed secondary stroke prevention measures and importance of close PCP follow-up for aggressive stroke risk factor management 2. HTN: BP goal<130/90.  Stable.  On amlodipine and metoprolol per PCP.  3. HLD: LDL goal<70.  Recent LDL 110. Per son, PCP recently started patient on atorvastatin 20 mg daily.  Advised to continue and ongoing follow-up with PCP for monitoring and management.   Follow up in 6 months or call earlier if needed  CC:  [facility] provider: Dr. Gray Bernhardt, Selena Batten, NP    I spent 30 minutes of face-to-face and non-face-to-face time with patient and son.  This included previsit chart review, lab review, study review, order entry, electronic health record documentation, patient  education regarding L thalamic ICH and etiology, residual deficits, importance of managing stroke risk factors and answered all questions to patient and sons satisfaction   Ihor Austin, AGNP-BC  [facility] [address] [place], [place] [address]  Phone [phone] Fax [phone] Note: This document was prepared with digital dictation and possible smart phrase technology. Any transcriptional errors that result from this process are unintentional.

## 2022-03-29 ENCOUNTER — Emergency Department (HOSPITAL_COMMUNITY): Payer: Medicare Other

## 2022-03-29 ENCOUNTER — Encounter (HOSPITAL_COMMUNITY): Payer: Self-pay

## 2022-03-29 ENCOUNTER — Other Ambulatory Visit: Payer: Self-pay

## 2022-03-29 ENCOUNTER — Inpatient Hospital Stay (HOSPITAL_COMMUNITY)
Admission: EM | Admit: 2022-03-29 | Discharge: 2022-04-05 | DRG: 683 | Disposition: A | Discharge status: Home / Self Care | Payer: Medicare Other | Attending: Student in an Organized Health Care Education/Training Program | Admitting: Student in an Organized Health Care Education/Training Program

## 2022-03-29 DIAGNOSIS — E039 Hypothyroidism, unspecified: Secondary | ICD-10-CM | POA: Diagnosis present

## 2022-03-29 DIAGNOSIS — E872 Acidosis, unspecified: Secondary | ICD-10-CM | POA: Diagnosis present

## 2022-03-29 DIAGNOSIS — K8010 Calculus of gallbladder with chronic cholecystitis without obstruction: Secondary | ICD-10-CM | POA: Diagnosis present

## 2022-03-29 DIAGNOSIS — N170 Acute kidney failure with tubular necrosis: Principal | ICD-10-CM | POA: Diagnosis present

## 2022-03-29 DIAGNOSIS — Z79899 Other long term (current) drug therapy: Secondary | ICD-10-CM

## 2022-03-29 DIAGNOSIS — E1122 Type 2 diabetes mellitus with diabetic chronic kidney disease: Secondary | ICD-10-CM | POA: Diagnosis present

## 2022-03-29 DIAGNOSIS — E861 Hypovolemia: Secondary | ICD-10-CM | POA: Diagnosis present

## 2022-03-29 DIAGNOSIS — N1831 Chronic kidney disease, stage 3a: Secondary | ICD-10-CM

## 2022-03-29 DIAGNOSIS — N289 Disorder of kidney and ureter, unspecified: Secondary | ICD-10-CM

## 2022-03-29 DIAGNOSIS — N1832 Chronic kidney disease, stage 3b: Secondary | ICD-10-CM | POA: Diagnosis present

## 2022-03-29 DIAGNOSIS — Z8249 Family history of ischemic heart disease and other diseases of the circulatory system: Secondary | ICD-10-CM

## 2022-03-29 DIAGNOSIS — L89150 Pressure ulcer of sacral region, unstageable: Secondary | ICD-10-CM | POA: Diagnosis present

## 2022-03-29 DIAGNOSIS — M6284 Sarcopenia: Secondary | ICD-10-CM | POA: Diagnosis present

## 2022-03-29 DIAGNOSIS — I10 Essential (primary) hypertension: Secondary | ICD-10-CM | POA: Diagnosis present

## 2022-03-29 DIAGNOSIS — Z79890 Hormone replacement therapy (postmenopausal): Secondary | ICD-10-CM

## 2022-03-29 DIAGNOSIS — K529 Noninfective gastroenteritis and colitis, unspecified: Secondary | ICD-10-CM | POA: Diagnosis present

## 2022-03-29 DIAGNOSIS — E860 Dehydration: Secondary | ICD-10-CM | POA: Diagnosis present

## 2022-03-29 DIAGNOSIS — R633 Feeding difficulties, unspecified: Secondary | ICD-10-CM | POA: Diagnosis present

## 2022-03-29 DIAGNOSIS — E785 Hyperlipidemia, unspecified: Secondary | ICD-10-CM | POA: Diagnosis present

## 2022-03-29 DIAGNOSIS — K8020 Calculus of gallbladder without cholecystitis without obstruction: Secondary | ICD-10-CM

## 2022-03-29 DIAGNOSIS — Z7401 Bed confinement status: Secondary | ICD-10-CM

## 2022-03-29 DIAGNOSIS — Z20822 Contact with and (suspected) exposure to covid-19: Secondary | ICD-10-CM | POA: Diagnosis present

## 2022-03-29 DIAGNOSIS — J90 Pleural effusion, not elsewhere classified: Secondary | ICD-10-CM

## 2022-03-29 DIAGNOSIS — R1084 Generalized abdominal pain: Principal | ICD-10-CM

## 2022-03-29 DIAGNOSIS — I69351 Hemiplegia and hemiparesis following cerebral infarction affecting right dominant side: Secondary | ICD-10-CM

## 2022-03-29 DIAGNOSIS — I129 Hypertensive chronic kidney disease with stage 1 through stage 4 chronic kidney disease, or unspecified chronic kidney disease: Secondary | ICD-10-CM | POA: Diagnosis present

## 2022-03-29 DIAGNOSIS — N390 Urinary tract infection, site not specified: Secondary | ICD-10-CM | POA: Diagnosis present

## 2022-03-29 DIAGNOSIS — K838 Other specified diseases of biliary tract: Secondary | ICD-10-CM | POA: Diagnosis present

## 2022-03-29 DIAGNOSIS — D539 Nutritional anemia, unspecified: Secondary | ICD-10-CM | POA: Diagnosis present

## 2022-03-29 DIAGNOSIS — B9620 Unspecified Escherichia coli [E. coli] as the cause of diseases classified elsewhere: Secondary | ICD-10-CM | POA: Diagnosis present

## 2022-03-29 DIAGNOSIS — R159 Full incontinence of feces: Secondary | ICD-10-CM | POA: Diagnosis present

## 2022-03-29 DIAGNOSIS — S14109S Unspecified injury at unspecified level of cervical spinal cord, sequela: Secondary | ICD-10-CM

## 2022-03-29 DIAGNOSIS — X58XXXS Exposure to other specified factors, sequela: Secondary | ICD-10-CM | POA: Diagnosis present

## 2022-03-29 DIAGNOSIS — E440 Moderate protein-calorie malnutrition: Secondary | ICD-10-CM | POA: Diagnosis present

## 2022-03-29 DIAGNOSIS — R32 Unspecified urinary incontinence: Secondary | ICD-10-CM | POA: Diagnosis present

## 2022-03-29 LAB — URINALYSIS, ROUTINE W REFLEX MICROSCOPIC
Bilirubin Urine: NEGATIVE
Glucose, UA: NEGATIVE mg/dL
Hgb urine dipstick: NEGATIVE
Ketones, ur: NEGATIVE mg/dL
Nitrite: NEGATIVE
Protein, ur: 30 mg/dL — AB
Specific Gravity, Urine: 1.008 (ref 1.005–1.030)
WBC, UA: >50 WBC/hpf — ABNORMAL HIGH (ref 0–5)
pH: 5 (ref 5.0–8.0)

## 2022-03-29 LAB — CBC
HCT: 34.3 % — ABNORMAL LOW (ref 39.0–52.0)
Hemoglobin: 10.6 g/dL — ABNORMAL LOW (ref 13.0–17.0)
MCH: 31.6 pg (ref 26.0–34.0)
MCHC: 30.9 g/dL (ref 30.0–36.0)
MCV: 102.4 fL — ABNORMAL HIGH (ref 80.0–100.0)
Platelets: 360 10*3/uL (ref 150–400)
RBC: 3.35 MIL/uL — ABNORMAL LOW (ref 4.22–5.81)
RDW: 14.7 % (ref 11.5–15.5)
WBC: 22.9 10*3/uL — ABNORMAL HIGH (ref 4.0–10.5)
nRBC: 0.1 % (ref 0.0–0.2)

## 2022-03-29 LAB — COMPREHENSIVE METABOLIC PANEL
ALT: 24 U/L (ref 0–44)
AST: 23 U/L (ref 15–41)
Albumin: 2.8 g/dL — ABNORMAL LOW (ref 3.5–5.0)
Alkaline Phosphatase: 95 U/L (ref 38–126)
Anion gap: 8 (ref 5–15)
BUN: 56 mg/dL — ABNORMAL HIGH (ref 8–23)
CO2: 10 mmol/L — ABNORMAL LOW (ref 22–32)
Calcium: 9.5 mg/dL (ref 8.9–10.3)
Chloride: 118 mmol/L — ABNORMAL HIGH (ref 98–111)
Creatinine, Ser: 3.25 mg/dL — ABNORMAL HIGH (ref 0.61–1.24)
GFR, Estimated: 20 mL/min — ABNORMAL LOW (ref >60)
Glucose, Bld: 110 mg/dL — ABNORMAL HIGH (ref 70–99)
Potassium: 4.4 mmol/L (ref 3.5–5.1)
Sodium: 136 mmol/L (ref 135–145)
Total Bilirubin: 0.6 mg/dL (ref 0.3–1.2)
Total Protein: 7.4 g/dL (ref 6.5–8.1)

## 2022-03-29 LAB — LIPASE, BLOOD: Lipase: 27 U/L (ref 11–51)

## 2022-03-29 LAB — RESP PANEL BY RT-PCR (FLU A&B, COVID) ARPGX2
Influenza A by PCR: NEGATIVE
Influenza B by PCR: NEGATIVE
SARS Coronavirus 2 by RT PCR: NEGATIVE

## 2022-03-29 MED ORDER — HYDROMORPHONE HCL 1 MG/ML IJ SOLN
0.5000 mg | Freq: Once | INTRAMUSCULAR | Status: AC
  Administered 2022-03-29: 0.5 mg via INTRAVENOUS
  Filled 2022-03-29: qty 1

## 2022-03-29 MED ORDER — SODIUM CHLORIDE 0.9 % IV BOLUS
1000.0000 mL | Freq: Once | INTRAVENOUS | Status: AC
  Administered 2022-03-30: 1000 mL via INTRAVENOUS

## 2022-03-29 MED ORDER — ONDANSETRON HCL 4 MG/2ML IJ SOLN
4.0000 mg | Freq: Once | INTRAMUSCULAR | Status: AC
  Administered 2022-03-29: 4 mg via INTRAVENOUS
  Filled 2022-03-29: qty 2

## 2022-03-29 MED ORDER — METRONIDAZOLE 500 MG/100ML IV SOLN
500.0000 mg | Freq: Once | INTRAVENOUS | Status: AC
  Administered 2022-03-29: 500 mg via INTRAVENOUS
  Filled 2022-03-29: qty 100

## 2022-03-29 MED ORDER — CIPROFLOXACIN IN D5W 400 MG/200ML IV SOLN
400.0000 mg | Freq: Once | INTRAVENOUS | Status: AC
Start: 2022-03-29 — End: 2022-03-30
  Administered 2022-03-30: 400 mg via INTRAVENOUS
  Filled 2022-03-29: qty 200

## 2022-03-29 MED ORDER — SODIUM CHLORIDE 0.9 % IV BOLUS
1000.0000 mL | Freq: Once | INTRAVENOUS | Status: AC
  Administered 2022-03-29: 1000 mL via INTRAVENOUS

## 2022-03-29 NOTE — ED Provider Notes (Signed)
11:00 PM ?Care of the patient assumed at signout.  Now, on discussing results with him, his son and his son's friend, with leukocytosis, concern for acute kidney injury with creatinine elevated, BUN 56, patient will continue fluids, will receive antibiotics given his abdominal pain, nausea, vomiting, some suspicion for intra-abdominal processes contributing to his symptoms.  CT, ultrasound, not demonstrative of acute cholecystitis, no abscess.  There is a pleural effusion bilaterally, but patient continues to have no respiratory complaints, no chest pain is not hypoxic either. ?  ?Gerhard Munch, MD ?03/29/22 2303 ? ?

## 2022-03-29 NOTE — ED Triage Notes (Addendum)
[facility]d pain just above navel x3 days with decreased appetite, nausea, and vomiting. No diarrhea or cp. Patient A&Ox4, NAD at this time. Distension noted at pain site, no hx of hernia reported. ?

## 2022-03-29 NOTE — H&P (Addendum)
?Date: 03/30/2022     ?     ?     ?Patient Name:  Terry Barton MRN: [id number]  ?DOB: 16-Aug-1956 Age / Sex: 66 y.o., male   ?PCP: Loura Back, NP    ?     ?Medical Service: Internal Medicine Teaching Service    ?     ?Attending Physician: Dr. Oswaldo Done, Marquita Palms, *    ?First Contact: Adron Bene, MD Pager: [phone]  ?Second Contact: Thalia Bloodgood, DO Pager: [phone]  ?     ?After Hours (After 5p/  First Contact Pager: [phone]  ?weekends / holidays): Second Contact Pager: [phone]  ? ?SUBJECTIVE  ? ?Chief Complaint: Abdominal pain, Nausea, vomiting ? ?History of Present Illness:  ?[name] is a 66 year old male with a PMHx of HTN, hypothyroidism, T2DM, CKD IIIb, hx of stroke w/ residual right-sided weakness, and bowel/bladder incontinence presented with 3 day hx of abdominal pain, nausea and vomiting. The abdominal pain is localized above the umbilicus that does not radiate. Described as an 8/10 gnawing pain that comes and go's throughout the day. The pain is worse after eating and at night.  He endorsed at least two nonbloody nonbilious episodes of emesis per day for the last 3 days. With the emesis episode mostly occurring at night. Most recent emesis, day of admission. He had his teeth pulled 1 year ago and since then he has had low appetite but for the last 3 days his decreased appetite worsened.  He drinks lots of water, about ~8 bottles of water a day.  He experienced associated nausea, subjective fevers and intermittent chills.  He also has mild fatigue, remittent lightheaded/dizziness and weakness.  He did not take any over-the-counter supplements to relieve his symptoms.  He denies trouble swallowing, sore throat, chest pain, or shortness of breath, constipation or diarrhea.  He does however endorse a dry cough with some tenderness with deep breathing intermittently.  He noticed that he has been peeing more frequently, however denies dysuria, hematuria or foul-smelling urine.  At  baseline, he uses adult diapers and his son changes him 2-3 times daily.  His last bowel movement was this morning, he did not notice any stool changes.  No sick contacts. ? ?He recently moved to [place] 6 months ago to live with his son who is caring for him.  He primarily uses a wheelchair, and has been using such for the last 6 years.  He does not have a PCP currently.  Since arriving to the ED, he endorsed improvement in his stomach pain, now a 4 out of 10. ? ?ED Course: BIBA; patient arrived in no acute distress, Glasgow score 15; complaining of acute abdominal pain.  Vitals stable, afebrile, BP 140/74; heart rate 64 and saturating well on room air.  CT abdomen significant for cholelithiasis; and pancreas unremarkable, normal-appearing liver; no evidence of bowel wall thickening, distention or inflammatory changes.  No evidence of volvulus. Moderate right pleural effusion, ground glass opacities.  RUQ ultrasound significant for cholelithiasis with no evidence of acute cholecystitis.  Chest x-ray negative for any cardiopulmonary disease.  CBC significant for leukocytosis with neutrophil predominance; macrocytic anemia with a hemoglobin of 10.6, MCV 102.  CMP significant for elevated creatinine of 3.25 and low bicarb of 10.  Lipase within normal limits.  Patient received 0.5 Dilaudid for pain and Zofran for nausea.  Patient appears dry on exam and was started on 1 L fluid bolus.  Antibiotics was initiated for GI coverage,  metronidazole 500 mg and ciprofloxacin 400 mg. ? ?Meds:  ?Metop tartrate 50 mg BID ?Mirtazipine 7.5mg  ?Amlodipine 10mg  ?Atorvastatin 20mg  ?Levothyroxine 50mcg ?Vit D3 2000 IU ? ?Past Medical History:  ?Diagnosis Date  ? Hypertension   ? Intracranial bleeding (HCC)   ? Stroke [facility](HCC)   ? ?History reviewed. No pertinent surgical history. ? ?Social:  ?Lives With: Son ?Occupation: Retired, previously a Chiropractorbricklayer. ?Support: Son is primary caregiver. ?Level of Function: Wheelchair at baseline; wears  adult diapers due to incontinence; son assists with ADLs ?PCP: None ?Substances: Never smoker; drinks alcohol occasionally; denies illicit drug use. ? ?Family History: Mother and father has past history of hypertension. ? ?Allergies: ?Allergies as of 03/29/2022  ? (No Known Allergies)  ? ? ?Review of Systems: ?A complete ROS was negative except as per HPI.  ? ?OBJECTIVE:  ? ?Physical Exam: ?Blood pressure 135/64, pulse 62, temperature 97.9 ?F (36.6 ?C), temperature source Oral, resp. rate 19, SpO2 100 %.  ?Constitutional: Fatigue-appearing elderly man lying in bed, in no acute distress ?HENT: normocephalic atraumatic, dry mucous membranes ?Eyes: conjunctiva non-erythematous ?Neck: supple, no lymphadenopathy ?Cardiovascular: regular rate and rhythm, no m/r/g ?Pulmonary/Chest: Auscultated lungs sounds anteriorly, which were clear.  Patient unable to sit up or roll over, could not appreciate lung sounds posteriorly. ?Abdominal: soft, non-tender, non-distended, bowel sounds present ?MSK: normal bulk and tone ?Neurological: alert & oriented x 3, 2/5 strength of the right upper and lower extremity. 4/5 strength of the left upper and lower extremity. ?Skin: warm and dry ?Psych: Pleasant, calm, normal behavior ? ?Labs: ?CBC ?   ?Component Value Date/Time  ? WBC 22.9 (H) 03/29/2022 2205  ? RBC 3.35 (L) 03/29/2022 2205  ? HGB 10.6 (L) 03/29/2022 2205  ? HCT 34.3 (L) 03/29/2022 2205  ? PLT 360 03/29/2022 2205  ? MCV 102.4 (H) 03/29/2022 2205  ? MCH 31.6 03/29/2022 2205  ? MCHC 30.9 03/29/2022 2205  ? RDW 14.7 03/29/2022 2205  ? LYMPHSABS 1.9 06/01/2020 0724  ? MONOABS 1.0 06/01/2020 0724  ? EOSABS 0.0 06/01/2020 0724  ? BASOSABS 0.1 06/01/2020 0724  ?  ? ?CMP  ?   ?Component Value Date/Time  ? NA 136 03/29/2022 2205  ? K 4.4 03/29/2022 2205  ? CL 118 (H) 03/29/2022 2205  ? CO2 10 (L) 03/29/2022 2205  ? GLUCOSE 110 (H) 03/29/2022 2205  ? BUN 56 (H) 03/29/2022 2205  ? CREATININE 3.25 (H) 03/29/2022 2205  ? CALCIUM 9.5 03/29/2022  2205  ? PROT 7.4 03/29/2022 2205  ? ALBUMIN 2.8 (L) 03/29/2022 2205  ? AST 23 03/29/2022 2205  ? ALT 24 03/29/2022 2205  ? ALKPHOS 95 03/29/2022 2205  ? BILITOT 0.6 03/29/2022 2205  ? GFRNONAA 20 (L) 03/29/2022 2205  ? GFRAA 39 (L) 06/01/2020 0724  ? ? ?Imaging: ?CT Abdomen Pelvis Wo Contrast ? ?Result Date: 03/29/2022 ?CLINICAL DATA:  Abdominal pain. EXAM: CT ABDOMEN AND PELVIS WITHOUT CONTRAST TECHNIQUE: Multidetector CT imaging of the abdomen and pelvis was performed following the standard protocol without IV contrast. RADIATION DOSE REDUCTION: This exam was performed according to the departmental dose-optimization program which includes automated exposure control, adjustment of the mA and/or kV according to patient size and/or use of iterative reconstruction technique. COMPARISON:  None. FINDINGS: Lower chest: Moderate right pleural effusion. Hazy ground-glass opacities with peripheral distribution in bilateral lung bases. Calcific atherosclerotic disease of the coronary arteries. Hepatobiliary: Normal appearance of the liver. Large amount of sludge and gallstones within the gallbladder lumen. 9 mm calcific density along the  posterior wall of the gallbladder may represent layering calculi or potentially wall calcifications. Pancreas: Unremarkable. No pancreatic ductal dilatation or surrounding inflammatory changes. Spleen: Normal in size without focal abnormality. Adrenals/Urinary Tract: Normal adrenal glands. Bilateral perinephric stranding. No evidence of hydronephrosis. 1 cm exophytic lower pole right renal cyst. Stomach/Bowel: Stomach is within normal limits. Appendix appears normal. No evidence of bowel wall thickening, distention, or inflammatory changes. Nonspecific gaseous distension of the rectum. No evidence of volvulus. Vascular/Lymphatic: Aortic atherosclerosis. No enlarged abdominal or pelvic lymph nodes. Reproductive: Prostate is unremarkable. Other: No abdominal wall hernia or abnormality. No  abdominopelvic ascites. Musculoskeletal: L5-S1 spondylosis. IMPRESSION: 1. Large amount of sludge and gallstones within the gallbladder lumen. 9 mm calcific density along the posterior wall of the gallbladder

## 2022-03-29 NOTE — ED Provider Notes (Signed)
?[facility] EMERGENCY DEPARTMENT ?Provider Note ? ? ?CSN: [id number] ?Arrival date & time: 03/29/22  1834 ? ?  ? ?History ? ?Chief Complaint  ?Patient presents with  ? Abdominal Pain  ? ? ?Terry Barton is a 66 y.o. male. ? ?Patient c/o mid to lower abd pain in the past few days. Symptoms acute onset, moderate, persistent, dull, non radiating. Pt relatively poor historian - level 5 caveat. +nausea/vomiting. Denies bilious or bloody emesis. States did have bm today. No dysuria or gu c/o. No back/flank pain. No fever or chills. Denies prior same pain.  ? ?The history is provided by the patient, the EMS personnel and medical records. The history is limited by the condition of the patient.  ?Abdominal Pain ?Associated symptoms: nausea and vomiting   ?Associated symptoms: no chest pain, no dysuria, no fever, no shortness of breath and no sore throat   ? ?  ? ?Home Medications ?Prior to Admission medications   ?Medication Sig Start Date End Date Taking? Authorizing Provider  ?acetaminophen (TYLENOL) 500 MG tablet Take 1,000 mg by mouth every 6 (six) hours as needed.    [provider]  ?amLODipine (NORVASC) 10 MG tablet Take 1 tablet (10 mg total) by mouth daily. 06/01/20   Leroy Sea, MD  ?atorvastatin (LIPITOR) 20 MG tablet Take 20 mg by mouth daily.    [provider]  ?Baclofen 5 MG TABS Take by mouth.    [provider]  ?Cholecalciferol (VITAMIN D3) 50 MCG (2000 UT) TABS Take by mouth.    [provider]  ?HYDROcodone-acetaminophen (NORCO/VICODIN) 5-325 MG tablet Take 1 tablet by mouth every 6 (six) hours as needed. 03/22/21   Linwood Dibbles, MD  ?LEVOTHYROXINE SODIUM PO Take 50 mcg by mouth.    [provider]  ?metoprolol tartrate (LOPRESSOR) 50 MG tablet Take 1 tablet (50 mg total) by mouth 2 (two) times daily. 06/01/20   Leroy Sea, MD  ?potassium chloride 20 MEQ/100ML IVPB Inject 20 mEq into the vein once.    [provider]   ?traZODone (DESYREL) 50 MG tablet Take 1 tablet (50 mg total) by mouth at bedtime as needed for sleep. 06/01/20   Leroy Sea, MD  ?   ? ?Allergies    ?Patient has no known allergies.   ? ?Review of Systems   ?Review of Systems  ?Constitutional:  Negative for fever.  ?HENT:  Negative for sore throat.   ?Eyes:  Negative for redness.  ?Respiratory:  Negative for shortness of breath.   ?     Rare non prod cough noted.   ?Cardiovascular:  Negative for chest pain.  ?Gastrointestinal:  Positive for abdominal pain, nausea and vomiting.  ?Genitourinary:  Negative for dysuria and flank pain.  ?Musculoskeletal:  Negative for back pain and neck pain.  ?Skin:  Negative for rash.  ?Neurological:  Negative for headaches.  ?Hematological:  Does not bruise/bleed easily.  ?Psychiatric/Behavioral:  Negative for confusion.   ? ?Physical Exam ?Updated Vital Signs ?BP 140/74 (BP Location: Right Arm)   Pulse 64   Temp 97.9 ?F (36.6 ?C) (Oral)   Resp 16   SpO2 98%  ?Physical Exam ?Vitals and nursing note reviewed.  ?Constitutional:   ?   Appearance: Normal appearance. He is well-developed.  ?HENT:  ?   Head: Atraumatic.  ?   Nose: Nose normal.  ?   Mouth/Throat:  ?   Mouth: Mucous membranes are moist.  ?Eyes:  ?   General:  No scleral icterus. ?   Conjunctiva/sclera: Conjunctivae normal.  ?   Pupils: Pupils are equal, round, and reactive to light.  ?Neck:  ?   Trachea: No tracheal deviation.  ?Cardiovascular:  ?   Rate and Rhythm: Normal rate and regular rhythm.  ?   Pulses: Normal pulses.  ?   Heart sounds: Normal heart sounds. No murmur heard. ?  No friction rub. No gallop.  ?Pulmonary:  ?   Effort: Pulmonary effort is normal. No accessory muscle usage or respiratory distress.  ?   Breath sounds: Normal breath sounds.  ?Abdominal:  ?   General: Bowel sounds are normal. There is distension.  ?   Palpations: Abdomen is soft.  ?   Tenderness: There is abdominal tenderness. There is no guarding.  ?   Comments: Abd distended,  +tenderness.   ?Genitourinary: ?   Comments: No cva tenderness. Normal external gu exam.  ?Musculoskeletal:     ?   General: No swelling.  ?   Cervical back: Normal range of motion and neck supple. No rigidity.  ?Skin: ?   General: Skin is warm and dry.  ?   Findings: No rash.  ?Neurological:  ?   Mental Status: He is alert.  ?   Comments: Alert, speech clear.   ?Psychiatric:     ?   Mood and Affect: Mood normal.  ? ? ?ED Results / Procedures / Treatments   ?Labs ?(all labs ordered are listed, but only abnormal results are displayed) ?Results for orders placed or performed during the hospital encounter of 03/22/21  ?CBC  ?Result Value Ref Range  ? WBC 10.3 4.0 - 10.5 K/uL  ? RBC 3.83 (L) 4.22 - 5.81 MIL/uL  ? Hemoglobin 11.2 (L) 13.0 - 17.0 g/dL  ? HCT 34.5 (L) 39.0 - 52.0 %  ? MCV 90.1 80.0 - 100.0 fL  ? MCH 29.2 26.0 - 34.0 pg  ? MCHC 32.5 30.0 - 36.0 g/dL  ? RDW 15.4 11.5 - 15.5 %  ? Platelets 358 150 - 400 K/uL  ? nRBC 0.0 0.0 - 0.2 %  ?Basic metabolic panel  ?Result Value Ref Range  ? Sodium 140 135 - 145 mmol/L  ? Potassium 3.2 (L) 3.5 - 5.1 mmol/L  ? Chloride 104 98 - 111 mmol/L  ? CO2 26 22 - 32 mmol/L  ? Glucose, Bld 124 (H) 70 - 99 mg/dL  ? BUN 19 8 - 23 mg/dL  ? Creatinine, Ser 1.50 (H) 0.61 - 1.24 mg/dL  ? Calcium 8.5 (L) 8.9 - 10.3 mg/dL  ? GFR, Estimated 51 (L) >60 mL/min  ? Anion gap 10 5 - 15  ? ? ? ?EKG ?EKG Interpretation ? ?Date/Time:  Saturday March 29 2022 18:44:16 EDT ?Ventricular Rate:  58 ?PR Interval:    ?QRS Duration: 130 ?QT Interval:  420 ?QTC Calculation: 413 ?R Axis:   53 ?Text Interpretation: Sinus rhythm Artifact Confirmed by Cathren Laine ([id number]) on 03/29/2022 7:21:08 PM ? ?Radiology ?CT Abdomen Pelvis Wo Contrast ? ?Result Date: 03/29/2022 ?CLINICAL DATA:  Abdominal pain. EXAM: CT ABDOMEN AND PELVIS WITHOUT CONTRAST TECHNIQUE: Multidetector CT imaging of the abdomen and pelvis was performed following the standard protocol without IV contrast. RADIATION DOSE REDUCTION: This exam was  performed according to the departmental dose-optimization program which includes automated exposure control, adjustment of the mA and/or kV according to patient size and/or use of iterative reconstruction technique. COMPARISON:  None. FINDINGS: Lower chest: Moderate right pleural effusion. Hazy ground-glass opacities with  peripheral distribution in bilateral lung bases. Calcific atherosclerotic disease of the coronary arteries. Hepatobiliary: Normal appearance of the liver. Large amount of sludge and gallstones within the gallbladder lumen. 9 mm calcific density along the posterior wall of the gallbladder may represent layering calculi or potentially wall calcifications. Pancreas: Unremarkable. No pancreatic ductal dilatation or surrounding inflammatory changes. Spleen: Normal in size without focal abnormality. Adrenals/Urinary Tract: Normal adrenal glands. Bilateral perinephric stranding. No evidence of hydronephrosis. 1 cm exophytic lower pole right renal cyst. Stomach/Bowel: Stomach is within normal limits. Appendix appears normal. No evidence of bowel wall thickening, distention, or inflammatory changes. Nonspecific gaseous distension of the rectum. No evidence of volvulus. Vascular/Lymphatic: Aortic atherosclerosis. No enlarged abdominal or pelvic lymph nodes. Reproductive: Prostate is unremarkable. Other: No abdominal wall hernia or abnormality. No abdominopelvic ascites. Musculoskeletal: L5-S1 spondylosis. IMPRESSION: 1. Large amount of sludge and gallstones within the gallbladder lumen. 9 mm calcific density along the posterior wall of the gallbladder may represent layering calculi or potentially wall calcifications. Further evaluation with right upper quadrant ultrasound may be considered. 2. Nonspecific gaseous distension of the rectum. No evidence of volvulus. 3. Moderate right pleural effusion. 4. Hazy ground-glass opacities with peripheral distribution in bilateral lung bases. This may represent  atypical/viral pneumonia. Aortic Atherosclerosis (ICD10-I70.0). Electronically Signed   By: Ted Mcalpineobrinka  Dimitrova M.D.   On: 03/29/2022 20:02  ? ?US Abdomen Limited RUQ (LIVER/GB) ? ?Result Date: 03/29/2022 ?CLINICAL DATA:  Abdomina

## 2022-03-29 NOTE — Discharge Instructions (Addendum)
Dear Terry Barton, ? ?Thank you trusting [place] with your care. We treated you in the hospital for an acute kidney injury and UTI. ? ?For your kidney disease: ?- Please take the sodium bicarbonate tablets 650mg  three times daily. ?- Please also ask your primary care doctor about a kidney doctor (nephrologist) referral. ? ?Please take the multivitamin daily for anemia. ? ?Please ask your primary care doctor about wound care for your pressure ulcer. ? ?Please follow up with your primary care doctor in 1-2 weeks.  ?

## 2022-03-29 NOTE — ED Notes (Signed)
Patient transported to CT 

## 2022-03-29 NOTE — ED Notes (Signed)
IV team at bedside 

## 2022-03-30 ENCOUNTER — Observation Stay (HOSPITAL_COMMUNITY): Payer: Medicare Other

## 2022-03-30 DIAGNOSIS — N170 Acute kidney failure with tubular necrosis: Secondary | ICD-10-CM | POA: Diagnosis present

## 2022-03-30 DIAGNOSIS — K6289 Other specified diseases of anus and rectum: Secondary | ICD-10-CM | POA: Diagnosis not present

## 2022-03-30 DIAGNOSIS — E440 Moderate protein-calorie malnutrition: Secondary | ICD-10-CM | POA: Diagnosis present

## 2022-03-30 DIAGNOSIS — E669 Obesity, unspecified: Secondary | ICD-10-CM | POA: Diagnosis not present

## 2022-03-30 DIAGNOSIS — E1122 Type 2 diabetes mellitus with diabetic chronic kidney disease: Secondary | ICD-10-CM | POA: Diagnosis present

## 2022-03-30 DIAGNOSIS — N183 Chronic kidney disease, stage 3 unspecified: Secondary | ICD-10-CM | POA: Diagnosis not present

## 2022-03-30 DIAGNOSIS — K838 Other specified diseases of biliary tract: Secondary | ICD-10-CM | POA: Diagnosis present

## 2022-03-30 DIAGNOSIS — E785 Hyperlipidemia, unspecified: Secondary | ICD-10-CM | POA: Diagnosis present

## 2022-03-30 DIAGNOSIS — L89150 Pressure ulcer of sacral region, unstageable: Secondary | ICD-10-CM | POA: Diagnosis present

## 2022-03-30 DIAGNOSIS — K529 Noninfective gastroenteritis and colitis, unspecified: Secondary | ICD-10-CM | POA: Diagnosis present

## 2022-03-30 DIAGNOSIS — N1831 Chronic kidney disease, stage 3a: Secondary | ICD-10-CM

## 2022-03-30 DIAGNOSIS — N1832 Chronic kidney disease, stage 3b: Secondary | ICD-10-CM | POA: Diagnosis present

## 2022-03-30 DIAGNOSIS — K8010 Calculus of gallbladder with chronic cholecystitis without obstruction: Secondary | ICD-10-CM | POA: Diagnosis present

## 2022-03-30 DIAGNOSIS — N390 Urinary tract infection, site not specified: Secondary | ICD-10-CM

## 2022-03-30 DIAGNOSIS — R140 Abdominal distension (gaseous): Secondary | ICD-10-CM | POA: Diagnosis not present

## 2022-03-30 DIAGNOSIS — M6284 Sarcopenia: Secondary | ICD-10-CM | POA: Diagnosis present

## 2022-03-30 DIAGNOSIS — N179 Acute kidney failure, unspecified: Secondary | ICD-10-CM

## 2022-03-30 DIAGNOSIS — Z794 Long term (current) use of insulin: Secondary | ICD-10-CM | POA: Diagnosis not present

## 2022-03-30 DIAGNOSIS — E876 Hypokalemia: Secondary | ICD-10-CM | POA: Diagnosis not present

## 2022-03-30 DIAGNOSIS — R1084 Generalized abdominal pain: Secondary | ICD-10-CM | POA: Diagnosis present

## 2022-03-30 DIAGNOSIS — Z79899 Other long term (current) drug therapy: Secondary | ICD-10-CM | POA: Diagnosis not present

## 2022-03-30 DIAGNOSIS — K5939 Other megacolon: Secondary | ICD-10-CM | POA: Diagnosis not present

## 2022-03-30 DIAGNOSIS — I129 Hypertensive chronic kidney disease with stage 1 through stage 4 chronic kidney disease, or unspecified chronic kidney disease: Secondary | ICD-10-CM | POA: Diagnosis present

## 2022-03-30 DIAGNOSIS — R112 Nausea with vomiting, unspecified: Secondary | ICD-10-CM | POA: Diagnosis not present

## 2022-03-30 DIAGNOSIS — E872 Acidosis, unspecified: Secondary | ICD-10-CM | POA: Diagnosis present

## 2022-03-30 DIAGNOSIS — Z20822 Contact with and (suspected) exposure to covid-19: Secondary | ICD-10-CM | POA: Diagnosis present

## 2022-03-30 DIAGNOSIS — B9620 Unspecified Escherichia coli [E. coli] as the cause of diseases classified elsewhere: Secondary | ICD-10-CM | POA: Diagnosis present

## 2022-03-30 DIAGNOSIS — K5981 Ogilvie syndrome: Secondary | ICD-10-CM | POA: Diagnosis not present

## 2022-03-30 DIAGNOSIS — E1129 Type 2 diabetes mellitus with other diabetic kidney complication: Secondary | ICD-10-CM | POA: Diagnosis not present

## 2022-03-30 DIAGNOSIS — E861 Hypovolemia: Secondary | ICD-10-CM | POA: Diagnosis present

## 2022-03-30 DIAGNOSIS — N189 Chronic kidney disease, unspecified: Secondary | ICD-10-CM | POA: Diagnosis not present

## 2022-03-30 DIAGNOSIS — I10 Essential (primary) hypertension: Secondary | ICD-10-CM | POA: Diagnosis not present

## 2022-03-30 DIAGNOSIS — Z7401 Bed confinement status: Secondary | ICD-10-CM | POA: Diagnosis not present

## 2022-03-30 DIAGNOSIS — R1010 Upper abdominal pain, unspecified: Secondary | ICD-10-CM

## 2022-03-30 DIAGNOSIS — R32 Unspecified urinary incontinence: Secondary | ICD-10-CM | POA: Diagnosis present

## 2022-03-30 DIAGNOSIS — Z79890 Hormone replacement therapy (postmenopausal): Secondary | ICD-10-CM | POA: Diagnosis not present

## 2022-03-30 DIAGNOSIS — E039 Hypothyroidism, unspecified: Secondary | ICD-10-CM | POA: Diagnosis present

## 2022-03-30 DIAGNOSIS — S14109S Unspecified injury at unspecified level of cervical spinal cord, sequela: Secondary | ICD-10-CM | POA: Diagnosis not present

## 2022-03-30 DIAGNOSIS — D539 Nutritional anemia, unspecified: Secondary | ICD-10-CM | POA: Diagnosis present

## 2022-03-30 DIAGNOSIS — E860 Dehydration: Secondary | ICD-10-CM | POA: Diagnosis present

## 2022-03-30 DIAGNOSIS — I69351 Hemiplegia and hemiparesis following cerebral infarction affecting right dominant side: Secondary | ICD-10-CM | POA: Diagnosis not present

## 2022-03-30 DIAGNOSIS — X58XXXS Exposure to other specified factors, sequela: Secondary | ICD-10-CM | POA: Diagnosis present

## 2022-03-30 LAB — BASIC METABOLIC PANEL
Anion gap: 6 (ref 5–15)
BUN: 54 mg/dL — ABNORMAL HIGH (ref 8–23)
CO2: 13 mmol/L — ABNORMAL LOW (ref 22–32)
Calcium: 8.6 mg/dL — ABNORMAL LOW (ref 8.9–10.3)
Chloride: 119 mmol/L — ABNORMAL HIGH (ref 98–111)
Creatinine, Ser: 2.93 mg/dL — ABNORMAL HIGH (ref 0.61–1.24)
GFR, Estimated: 23 mL/min — ABNORMAL LOW (ref >60)
Glucose, Bld: 112 mg/dL — ABNORMAL HIGH (ref 70–99)
Potassium: 4.3 mmol/L (ref 3.5–5.1)
Sodium: 138 mmol/L (ref 135–145)

## 2022-03-30 LAB — CBC WITH DIFFERENTIAL/PLATELET
Abs Immature Granulocytes: 0.28 10*3/uL — ABNORMAL HIGH (ref 0.00–0.07)
Basophils Absolute: 0.1 10*3/uL (ref 0.0–0.1)
Basophils Relative: 0 %
Eosinophils Absolute: 0.2 10*3/uL (ref 0.0–0.5)
Eosinophils Relative: 1 %
HCT: 29.7 % — ABNORMAL LOW (ref 39.0–52.0)
Hemoglobin: 9.2 g/dL — ABNORMAL LOW (ref 13.0–17.0)
Immature Granulocytes: 1 %
Lymphocytes Relative: 9 %
Lymphs Abs: 1.7 10*3/uL (ref 0.7–4.0)
MCH: 31.5 pg (ref 26.0–34.0)
MCHC: 31 g/dL (ref 30.0–36.0)
MCV: 101.7 fL — ABNORMAL HIGH (ref 80.0–100.0)
Monocytes Absolute: 1.2 10*3/uL — ABNORMAL HIGH (ref 0.1–1.0)
Monocytes Relative: 6 %
Neutro Abs: 16 10*3/uL — ABNORMAL HIGH (ref 1.7–7.7)
Neutrophils Relative %: 83 %
Platelets: 344 10*3/uL (ref 150–400)
RBC: 2.92 MIL/uL — ABNORMAL LOW (ref 4.22–5.81)
RDW: 14.5 % (ref 11.5–15.5)
WBC: 19.4 10*3/uL — ABNORMAL HIGH (ref 4.0–10.5)
nRBC: 0 % (ref 0.0–0.2)

## 2022-03-30 LAB — IRON AND TIBC
Iron: 30 ug/dL — ABNORMAL LOW (ref 45–182)
Saturation Ratios: 29 % (ref 17.9–39.5)
TIBC: 104 ug/dL — ABNORMAL LOW (ref 250–450)
UIBC: 74 ug/dL

## 2022-03-30 LAB — TSH: TSH: 1.972 u[IU]/mL (ref 0.350–4.500)

## 2022-03-30 LAB — HEMOGLOBIN A1C
Hgb A1c MFr Bld: 5.2 % (ref 4.8–5.6)
Mean Plasma Glucose: 102.54 mg/dL

## 2022-03-30 LAB — FERRITIN: Ferritin: 924 ng/mL — ABNORMAL HIGH (ref 24–336)

## 2022-03-30 LAB — RETICULOCYTES
Immature Retic Fract: 20 % — ABNORMAL HIGH (ref 2.3–15.9)
RBC.: 2.88 MIL/uL — ABNORMAL LOW (ref 4.22–5.81)
Retic Count, Absolute: 109.7 10*3/uL (ref 19.0–186.0)
Retic Ct Pct: 3.8 % — ABNORMAL HIGH (ref 0.4–3.1)

## 2022-03-30 LAB — VITAMIN B12: Vitamin B-12: 621 pg/mL (ref 180–914)

## 2022-03-30 LAB — HIV ANTIBODY (ROUTINE TESTING W REFLEX): HIV Screen 4th Generation wRfx: NONREACTIVE

## 2022-03-30 LAB — FOLATE: Folate: 12.2 ng/mL (ref >5.9)

## 2022-03-30 LAB — LACTIC ACID, PLASMA: Lactic Acid, Venous: 1.2 mmol/L (ref 0.5–1.9)

## 2022-03-30 MED ORDER — SODIUM CHLORIDE 0.9 % IV SOLN: 1.0000 g | Freq: Every day | INTRAVENOUS | Status: DC

## 2022-03-30 MED ORDER — LEVOTHYROXINE SODIUM 50 MCG PO TABS
50.0000 ug | ORAL_TABLET | Freq: Every day | ORAL | Status: DC
  Administered 2022-03-30 – 2022-04-04 (×6): 50 ug via ORAL
  Filled 2022-03-30 (×2): qty 1
  Filled 2022-03-30: qty 2
  Filled 2022-03-30 (×3): qty 1

## 2022-03-30 MED ORDER — SODIUM BICARBONATE 8.4 % IV SOLN
100.0000 meq | Freq: Once | INTRAVENOUS | Status: AC
  Administered 2022-03-30: 100 meq via INTRAVENOUS
  Filled 2022-03-30: qty 50

## 2022-03-30 MED ORDER — PANTOPRAZOLE SODIUM 20 MG PO TBEC
20.0000 mg | DELAYED_RELEASE_TABLET | Freq: Every day | ORAL | Status: DC
Start: 2022-03-30 — End: 2022-04-05
  Administered 2022-03-30 – 2022-04-04 (×6): 20 mg via ORAL
  Filled 2022-03-30 (×7): qty 1

## 2022-03-30 MED ORDER — PIPERACILLIN-TAZOBACTAM 3.375 G IVPB
3.3750 g | Freq: Three times a day (TID) | INTRAVENOUS | Status: DC
  Administered 2022-03-30 – 2022-04-01 (×7): 3.375 g via INTRAVENOUS
  Filled 2022-03-30 (×8): qty 50

## 2022-03-30 MED ORDER — ACETAMINOPHEN 325 MG PO TABS: 650.0000 mg | ORAL_TABLET | Freq: Four times a day (QID) | ORAL | Status: DC | PRN

## 2022-03-30 MED ORDER — ACETAMINOPHEN 650 MG RE SUPP: 650.0000 mg | Freq: Four times a day (QID) | RECTAL | Status: DC | PRN

## 2022-03-30 MED ORDER — ADULT MULTIVITAMIN W/MINERALS CH
1.0000 | ORAL_TABLET | Freq: Every day | ORAL | Status: DC
  Administered 2022-03-30 – 2022-04-04 (×6): 1 via ORAL
  Filled 2022-03-30 (×6): qty 1

## 2022-03-30 MED ORDER — LACTATED RINGERS IV SOLN: INTRAVENOUS | Status: AC

## 2022-03-30 MED ORDER — ENOXAPARIN SODIUM 30 MG/0.3ML IJ SOSY
30.0000 mg | PREFILLED_SYRINGE | Freq: Every day | INTRAMUSCULAR | Status: DC
  Administered 2022-03-30 – 2022-04-02 (×4): 30 mg via SUBCUTANEOUS
  Filled 2022-03-30 (×4): qty 0.3

## 2022-03-30 MED ORDER — ATORVASTATIN CALCIUM 10 MG PO TABS
20.0000 mg | ORAL_TABLET | Freq: Every day | ORAL | Status: DC
  Administered 2022-03-30 – 2022-04-04 (×6): 20 mg via ORAL
  Filled 2022-03-30 (×6): qty 2

## 2022-03-30 MED ORDER — ALUM & MAG HYDROXIDE-SIMETH 200-200-20 MG/5ML PO SUSP: 15.0000 mL | Freq: Four times a day (QID) | ORAL | Status: DC | PRN

## 2022-03-30 MED ORDER — TECHNETIUM TC 99M MEBROFENIN IV KIT
5.5000 | PACK | Freq: Once | INTRAVENOUS | Status: AC | PRN
  Administered 2022-03-30: 5.5 via INTRAVENOUS

## 2022-03-30 MED ORDER — ORAL CARE MOUTH RINSE
15.0000 mL | Freq: Two times a day (BID) | OROMUCOSAL | Status: DC
  Administered 2022-03-31 – 2022-04-04 (×10): 15 mL via OROMUCOSAL

## 2022-03-30 NOTE — Progress Notes (Signed)
Pharmacy Antibiotic Note ? ?Terry Barton is a 66 y.o. male admitted on 03/29/2022 with concern for  intra-abdominal infection .  Pharmacy has been consulted for Unasyn dosing. NKDA.  ? 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 Discussed with Dr. Johnney Ou and decision made to change to Zosyn.  ? ?WBC elevated at 19, AKI - SCr 3.25 >>2.93 (Normalized CrCl ~24 mL/min). Making urine. Bradycardic. MAP 70-80s. Afebrile.  ? ?CT showing biliary sludging with cholelithiasis (no evidence of acute cholecystitis)  ? ?Plan: ?Zosyn 3.375g IV every 8 hours - 4 hr infusion. ?Monitor renal function closely and adjust if needed.  ?Follow-up culture results and clinical status.  ?  ? ?Temp (24hrs), Avg:97.9 ?F (36.6 ?C), Min:97.9 ?F (36.6 ?C), Max:97.9 ?F (36.6 ?C) ? ?Recent Labs  ?Lab 03/29/22 ?2205 03/30/22 ?0405  ?WBC 22.9* 19.4*  ?CREATININE 3.25* 2.93*  ?  ?CrCl cannot be calculated (Unknown ideal weight.).   ? ?No Known Allergies ? ?Antimicrobials this admission: ?Cipro 4/23 x1 ?Flagyl 4/23 x1 ?Unasyn 4/23 >> ? ?Dose adjustments this admission: ? ? ?Microbiology results: ?4/23 BCx: sent ?4/23 UCx: sent  ? ? ?Thank you for allowing pharmacy to be a part of this patient?s care. ? ?Sloan Leiter, PharmD, BCPS, BCCCP ?Clinical Pharmacist ?Please refer to [facility] for [place] numbers ?03/30/2022 7:45 AM ? ?

## 2022-03-30 NOTE — ED Notes (Signed)
Pt awaje wants to be turned no staff available to help at present ?

## 2022-03-30 NOTE — ED Notes (Signed)
Patient going to floor from Nuc Med study.  Report given to floor RN ?

## 2022-03-30 NOTE — ED Notes (Signed)
Requesting something to drink but patient is NPO for hida scan which was explained to patient.  ?

## 2022-03-30 NOTE — Progress Notes (Incomplete)
? ?HD#0 ?SUBJECTIVE:  ?Patient Summary: Terry Barton is a 66 y.o. with a pertinent PMH of HTN, hypothyroidism, T2DM, CKD IIIb, hx of stroke w/ residual right-sided weakness, and bowel/bladder incontinence, who presented with abdominal pain with nausea and vomiting and admitted for further evaluation of nausea/ vomiting syndrome.  ? ?Overnight Events: none ? ?Interim History: Patient assessed at bedside this AM. His son and daughter-in-law is at bedside. Patient states that he has not had a good appetite for the last few months. He has been passing gas. Denies pain in abdomen. His breathing feels ok, he has not been coughing recently. His other son, who is his primary caregiver will be at the hospital later today. ? ?OBJECTIVE:  ?Vital Signs: ?Vitals:  ? 03/30/22 0445 03/30/22 0530 03/30/22 0615 03/30/22 0700  ?BP:  (!) 124/53 124/73 (!) 126/56  ?Pulse:  (!) 53 (!) 47 (!) 49  ?Resp: 17 16  16   ?Temp:      ?TempSrc:      ?SpO2:  99% 99% 99%  ? ?Supplemental O2: {NAMES:3044014::"Room Air","Nasal Cannula","Simple Face Mask","Partial Rebreather","HFNC","Non Rebreather","Venturi Mask","Bag Valve Mask"} ?SpO2: 99 % ? ?There were no vitals filed for this visit. ? ? ?Intake/Output Summary (Last 24 hours) at 03/30/2022 0738 ?Last data filed at 03/29/2022 2240 ?Gross per 24 hour  ?Intake --  ?Output 400 ml  ?Net -400 ml  ? ?Net IO Since Admission: -400 mL [03/30/22 0738] ? ?Physical Exam: ?Physical Exam ? ?Patient Lines/Drains/Airways Status   ? ? Active Line/Drains/Airways   ? ? Name Placement date Placement time Site Days  ? Peripheral IV 03/29/22 22 G 1" Left;Upper Arm 03/29/22  2139  Arm  1  ? ?  ?  ? ?  ? ? ?Pertinent Labs: ? ?  Latest Ref Rng & Units 03/30/2022  ?  4:05 AM 03/29/2022  ? 10:05 PM 03/22/2021  ?  4:57 PM  ?CBC  ?WBC 4.0 - 10.5 K/uL 19.4   22.9   10.3    ?Hemoglobin 13.0 - 17.0 g/dL 9.2   10.6   11.2    ?Hematocrit 39.0 - 52.0 % 29.7   34.3   34.5    ?Platelets 150 - 400 K/uL 344   360   358    ? ? ? ?  Latest  Ref Rng & Units 03/30/2022  ?  4:05 AM 03/29/2022  ? 10:05 PM 03/22/2021  ?  4:57 PM  ?CMP  ?Glucose 70 - 99 mg/dL 112   110   124    ?BUN 8 - 23 mg/dL 54   56   19    ?Creatinine 0.61 - 1.24 mg/dL 2.93   3.25   1.50    ?Sodium 135 - 145 mmol/L 138   136   140    ?Potassium 3.5 - 5.1 mmol/L 4.3   4.4   3.2    ?Chloride 98 - 111 mmol/L 119   118   104    ?CO2 22 - 32 mmol/L 13   10   26     ?Calcium 8.9 - 10.3 mg/dL 8.6   9.5   8.5    ?Total Protein 6.5 - 8.1 g/dL  7.4     ?Total Bilirubin 0.3 - 1.2 mg/dL  0.6     ?Alkaline Phos 38 - 126 U/L  95     ?AST 15 - 41 U/L  23     ?ALT 0 - 44 U/L  24     ? ? ?No  results for input(s): GLUCAP in the last 72 hours.  ? ?Pertinent Imaging: ?CT Abdomen Pelvis Wo Contrast ? ?Result Date: 03/29/2022 ?CLINICAL DATA:  Abdominal pain. EXAM: CT ABDOMEN AND PELVIS WITHOUT CONTRAST TECHNIQUE: Multidetector CT imaging of the abdomen and pelvis was performed following the standard protocol without IV contrast. RADIATION DOSE REDUCTION: This exam was performed according to the departmental dose-optimization program which includes automated exposure control, adjustment of the mA and/or kV according to patient size and/or use of iterative reconstruction technique. COMPARISON:  None. FINDINGS: Lower chest: Moderate right pleural effusion. Hazy ground-glass opacities with peripheral distribution in bilateral lung bases. Calcific atherosclerotic disease of the coronary arteries. Hepatobiliary: Normal appearance of the liver. Large amount of sludge and gallstones within the gallbladder lumen. 9 mm calcific density along the posterior wall of the gallbladder may represent layering calculi or potentially wall calcifications. Pancreas: Unremarkable. No pancreatic ductal dilatation or surrounding inflammatory changes. Spleen: Normal in size without focal abnormality. Adrenals/Urinary Tract: Normal adrenal glands. Bilateral perinephric stranding. No evidence of hydronephrosis. 1 cm exophytic lower pole  right renal cyst. Stomach/Bowel: Stomach is within normal limits. Appendix appears normal. No evidence of bowel wall thickening, distention, or inflammatory changes. Nonspecific gaseous distension of the rectum. No evidence of volvulus. Vascular/Lymphatic: Aortic atherosclerosis. No enlarged abdominal or pelvic lymph nodes. Reproductive: Prostate is unremarkable. Other: No abdominal wall hernia or abnormality. No abdominopelvic ascites. Musculoskeletal: L5-S1 spondylosis. IMPRESSION: 1. Large amount of sludge and gallstones within the gallbladder lumen. 9 mm calcific density along the posterior wall of the gallbladder may represent layering calculi or potentially wall calcifications. Further evaluation with right upper quadrant ultrasound may be considered. 2. Nonspecific gaseous distension of the rectum. No evidence of volvulus. 3. Moderate right pleural effusion. 4. Hazy ground-glass opacities with peripheral distribution in bilateral lung bases. This may represent atypical/viral pneumonia. Aortic Atherosclerosis (ICD10-I70.0). Electronically Signed   By: Fidela Salisbury M.D.   On: 03/29/2022 20:02  ? ?DG Chest Port 1 View ? ?Result Date: 03/29/2022 ?CLINICAL DATA:  Cough EXAM: PORTABLE CHEST 1 VIEW COMPARISON:  05/27/2020 FINDINGS: The heart size and mediastinal contours are within normal limits. Both lungs are clear. The visualized skeletal structures are unremarkable. IMPRESSION: No active disease. Electronically Signed   By: Ulyses Jarred M.D.   On: 03/29/2022 22:01  ? ?US Abdomen Limited RUQ (LIVER/GB) ? ?Result Date: 03/29/2022 ?CLINICAL DATA:  Abdominal pain, abnormal CT EXAM: ULTRASOUND ABDOMEN LIMITED RIGHT UPPER QUADRANT COMPARISON:  CT today FINDINGS: Gallbladder: Layering stones and sludge within the gallbladder. No wall thickening or sonographic Murphy sign. Common bile duct: Diameter: Not visualized. Common bile duct was normal caliber on earlier CT. Liver: Difficult to visualize due to bowel gas.  No visible focal abnormality. Portal vein is patent on color Doppler imaging with normal direction of blood flow towards the liver. Other: None. IMPRESSION: Cholelithiasis.  No sonographic evidence of acute cholecystitis. Limited study due to bowel gas. Electronically Signed   By: Rolm Baptise M.D.   On: 03/29/2022 21:11   ? ?ASSESSMENT/PLAN:  ?Assessment: ?Principal Problem: ?  Gastroenteritis ?Active Problems: ?  Essential hypertension ?  Hyperlipidemia ?  CKD (chronic kidney disease), stage III ([place]) ?  UTI (urinary tract infection) ?  Macrocytic anemia ? ? ?Terry Barton is a 66 y.o. with a pertinent PMH of HTN, hypothyroidism, T2DM, CKD IIIb, hx of stroke w/ residual right-sided weakness, and bowel/bladder incontinence, who presented with abdominal pain with nausea and vomiting and admitted for further evaluation of nausea/ vomiting syndrome  on hospital day 1.  ? ?Plan: ?#*** ?CT showed biliary sludging with cholelithiasis  ? ?-Unasyn ?-blood culture ? ?#*** ?*** ? ?#*** ?*** ? ?Best Practice: ?Diet: NPO ?IVF: Fluids: {Meds; iv fluids:31617}, Rate: {NAMES:3044014::"None","*** cc/hr x *** hrs","*** cc bolus"} ?VTE: enoxaparin (LOVENOX) injection 30 mg Start: 03/30/22 1000 ?Code: {NAMES:3044014::"Full","DNR","DNI","DNR/DNI","Comfort Care","Unknown"} ?AB: *** ?Therapy Recs: {NAMES:3044014::"None","Pending","CIR","SNF","ALF","LTAC","Home Health"}, DME: {[employer] JX:[id number] ?Family Contact: ***, {Family:304960261::"to be notified."} ?DISPO: Anticipated discharge {NAMES:3044014::"today","tomorrow","in *** days"} to {Discharge Destination:18313::"Home"} pending {BARRIERS TO DISCHARGE:24135}. ? ?Signature: ?[employer], D.O. ?Internal Medicine Resident, PGY-1 ?[facility]  ?Pager: [phone] ?7:38 AM, 03/30/2022  ? ?Please contact the on call pager after 5 pm and on weekends at [phone].  ?

## 2022-03-30 NOTE — ED Notes (Signed)
Staff changed brief and cleaned patient up from BM and noticed small pressure wound to sacrum approx size of pencil earaser.  ?

## 2022-03-30 NOTE — ED Notes (Signed)
Admitting Team at bedside.

## 2022-03-30 NOTE — ED Notes (Signed)
Requested lab draw blood cultures due to patient being extremely hard stick.  ?

## 2022-03-30 NOTE — Consult Note (Signed)
? ?Consultation ? ?Referring Provider:  medicine servioce ?Primary Care Physician:  Arthur Holms, NP ?Primary Gastroenterologist:  none ? ?Reason for Consultation: Abdominal pain nausea vomiting, cholelithiasis ? ?HPI: Terry Barton is a 66 y.o. male, who was admitted yesterday through the emergency room after he had presented with complaints of abdominal discomfort nausea and vomiting..  Patient is wheelchair-bound, has history of a cervical spine injury several years ago with some chronic right-sided neurologic deficit from CVA about 2 years ago.  Also with history of chronic kidney disease stage III and hypertension. ?Patient says he started feeling ill a couple of days ago and primarily had just lost his appetite.  This was followed by nausea and then nausea and vomiting yesterday on the day of admission.  He says he has not been having abdominal pain and is not having abdominal pain today or nausea, and is feeling a bit better.  He had been unaware of any fever or chills at home, no diarrhea melena or hematochezia.  Denies any dysuria. ?He denies chronic problems with constipation. ?Work-up in the emergency room with noncontrasted CT scan noting moderate right effusion, sludge and stones in the gallbladder but no wall thickening, there is bilateral perinephric stranding no hydronephrosis, there is some distention of the rectum, hazy groundglass opacities bilateral lung bases question pneumonia ? ?Upper abdominal ultrasound shows gallstones no gallbladder wall thickening CBD not seen but was normal on CT, and commented on by radiologist ?Chest x-ray negative ?HIDA scan today filling of the gallbladder, tracer had not reached the small bowel at 1 hour. ? ?Labs on admission WBC 22.9/hemoglobin 10.6/hematocrit 34.3 ?BUN 56/creatinine 3.25 entheses baseline 1.5) ?LFTs normal ?UA positive with many bacteria-culture pending ?Culture ordered today and pending ?Ferritin 924 serum iron 30/TIBC 104/sat 29 ? ?Today WBC down  to 19.2/hemoglobin 9.2/MCV 101 ?Lactate and c-Met pending ordered this afternoon ? ?Patient currently on Zosyn. ? ? ? ?Past Medical History:  ?Diagnosis Date  ? Hypertension   ? Intracranial bleeding ([place])   ? Stroke [facility])   ? ? ?History reviewed. No pertinent surgical history. ? ?Prior to Admission medications   ?Medication Sig Start Date End Date Taking? Authorizing Provider  ?acetaminophen (TYLENOL) 500 MG tablet Take 1,000 mg by mouth every 6 (six) hours as needed for moderate pain.   Yes [provider]  ?amLODipine (NORVASC) 10 MG tablet Take 1 tablet (10 mg total) by mouth daily. 06/01/20  Yes Thurnell Lose, MD  ?atorvastatin (LIPITOR) 20 MG tablet Take 20 mg by mouth daily.   Yes [provider]  ?Baclofen 5 MG TABS Take 5 mg by mouth every 6 (six) hours as needed (muscle spams).   Yes [provider]  ?Cholecalciferol (VITAMIN D3) 50 MCG (2000 UT) TABS Take 50 mcg by mouth daily.   Yes [provider]  ?LEVOTHYROXINE SODIUM PO Take 50 mcg by mouth.   Yes [provider]  ?metoprolol tartrate (LOPRESSOR) 50 MG tablet Take 1 tablet (50 mg total) by mouth 2 (two) times daily. 06/01/20  Yes Thurnell Lose, MD  ?mirtazapine (REMERON) 7.5 MG tablet Take 7.5 mg by mouth at bedtime.   Yes [provider]  ?potassium chloride SA (KLOR-CON M) 20 MEQ tablet Take 40 mEq by mouth 2 (two) times daily.   Yes [provider]  ?traZODone (DESYREL) 50 MG tablet Take 1 tablet (50 mg total) by mouth at bedtime as needed for sleep. 06/01/20  Yes Thurnell Lose, MD  ?HYDROcodone-acetaminophen (NORCO/VICODIN) [id number]  MG tablet Take 1 tablet by mouth every 6 (six) hours as needed. ?Patient not taking: Reported on 03/30/2022 03/22/21   Dorie Rank, MD  ? ? ?Current Facility-Administered Medications  ?Medication Dose Route Frequency Provider Last Rate Last Admin  ? acetaminophen (TYLENOL) tablet 650 mg  650 mg Oral Q6H PRN Timothy Lasso, MD      ? Or  ?  acetaminophen (TYLENOL) suppository 650 mg  650 mg Rectal Q6H PRN Timothy Lasso, MD      ? alum & mag hydroxide-simeth (MAALOX/MYLANTA) 200-200-20 MG/5ML suspension 15 mL  15 mL Oral Q6H PRN Gaylan Gerold, DO      ? atorvastatin (LIPITOR) tablet 20 mg  20 mg Oral Daily Gaylan Gerold, DO   20 mg at 03/30/22 3500  ? enoxaparin (LOVENOX) injection 30 mg  30 mg Subcutaneous Daily Timothy Lasso, MD   30 mg at 03/30/22 0913  ? lactated ringers infusion   Intravenous Continuous Katsadouros, Vasilios, MD      ? levothyroxine (SYNTHROID) tablet 50 mcg  50 mcg Oral Q0600 Gaylan Gerold, DO   50 mcg at 03/30/22 0730  ? multivitamin with minerals tablet 1 tablet  1 tablet Oral Daily Katsadouros, Vasilios, MD      ? pantoprazole (PROTONIX) EC tablet 20 mg  20 mg Oral Daily Timothy Lasso, MD   20 mg at 03/30/22 0912  ? piperacillin-tazobactam (ZOSYN) IVPB 3.375 g  3.375 g Intravenous Q8H Millen, Jessica B, RPH 12.5 mL/hr at 03/30/22 0913 3.375 g at 03/30/22 0913  ? ? ?Allergies as of 03/29/2022  ? (No Known Allergies)  ? ? ?Family History  ?Family history unknown: Yes  ? ? ?Social History  ? ?Socioeconomic History  ? Marital status: Single  ?  Spouse name: Not on file  ? Number of children: Not on file  ? Years of education: Not on file  ? Highest education level: Not on file  ?Occupational History  ? Not on file  ?Tobacco Use  ? Smoking status: Never  ? Smokeless tobacco: Never  ?Vaping Use  ? Vaping Use: Never used  ?Substance and Sexual Activity  ? Alcohol use: Not Currently  ? Drug use: Not Currently  ? Sexual activity: Not Currently  ?Other Topics Concern  ? Not on file  ?Social History Narrative  ? Not on file  ? ?Social Determinants of Health  ? ?Financial Resource Strain: Not on file  ?Food Insecurity: Not on file  ?Transportation Needs: Not on file  ?Physical Activity: Not on file  ?Stress: Not on file  ?Social Connections: Not on file  ?Intimate Partner Violence: Not on file  ? ? ?Review of Systems: ?Pertinent  positive and negative review of systems were noted in the above HPI section.  All other review of systems was otherwise negative.  ? ?Physical Exam: ?Vital signs in last 24 hours: ?Temp:  [97.9 ?F (36.6 ?C)-98.3 ?F (36.8 ?C)] 98.3 ?F (36.8 ?C) (04/23 1422) ?Pulse Rate:  [47-69] 69 (04/23 1422) ?Resp:  [11-30] 16 (04/23 1422) ?BP: (116-162)/(53-104) 133/86 (04/23 1422) ?SpO2:  [94 %-100 %] 99 % (04/23 1422) ?  ?General:   Alert,  Well-developed, well-nourished, older African-American male pleasant and cooperative in NAD ?Head:  Normocephalic and atraumatic. ?Eyes:  Sclera clear, no icterus.   Conjunctiva pink. ?Ears:  Normal auditory acuity. ?Nose:  No deformity, discharge,  or lesions. ?Mouth:  No deformity or lesions.   ?Neck:  Supple; no masses or thyromegaly. ?Lungs:  Clear throughout to auscultation.  No wheezes, crackles, or rhonchi.  ?Heart:  Regular rate and rhythm; no murmurs, clicks, rubs,  or gallops. ?Abdomen:  Soft,nontender, BS active,nonpalp mass or hsm.  Diastases recti midline upper abdomen, abdomen somewhat doughy feeling ?Rectal: Not done ?Msk:  Symmetrical without gross deformities. Marland Kitchen ?Pulses:  Normal pulses noted. ?Extremities:  Without clubbing or edema. ?Neurologic:  Alert and  oriented x4; right hemiparesis. ?Skin:  Intact without significant lesions or rashes.Marland Kitchen ?Psych:  Alert and cooperative. Normal mood and affect. ? ?Intake/Output from previous day: ?04/22 0701 - 04/23 0700 ?In: -  ?Out: 400 [Urine:400] ?Intake/Output this shift: ?Total I/O ?In: 1000 [IV Piggyback:1000] ?Out: 300 [Urine:300] ? ?Lab Results: ?Recent Labs  ?  03/29/22 ?2205 03/30/22 ?0405  ?WBC 22.9* 19.4*  ?HGB 10.6* 9.2*  ?HCT 34.3* 29.7*  ?PLT 360 344  ? ?BMET ?Recent Labs  ?  03/29/22 ?2205 03/30/22 ?0405  ?NA 136 138  ?K 4.4 4.3  ?CL 118* 119*  ?CO2 10* 13*  ?GLUCOSE 110* 112*  ?BUN 56* 54*  ?CREATININE 3.25* 2.93*  ?CALCIUM 9.5 8.6*  ? ?LFT ?Recent Labs  ?  03/29/22 ?2205  ?PROT 7.4  ?ALBUMIN 2.8*  ?AST 23  ?ALT 24   ?ALKPHOS 95  ?BILITOT 0.6  ? ?PT/INR ?No results for input(s): LABPROT, INR in the last 72 hours. ?Hepatitis Panel ?No results for input(s): HEPBSAG, HCVAB, HEPAIGM, HEPBIGM in the last 72 hours. ? ? ? ?IMPRESSION:  ?

## 2022-03-30 NOTE — ED Notes (Signed)
Pt transported to Nuc Med. 

## 2022-03-30 NOTE — ED Notes (Signed)
Unable to obtain 2nd blood culture due to difficulty stick ?

## 2022-03-30 NOTE — ED Notes (Signed)
The pt has had 2 liters of fluid ?

## 2022-03-30 NOTE — Progress Notes (Addendum)
? ? ?HD#0 ?Subjective:  ?Overnight Events: Admitted overnight ? ?Patient resting in bed comfortably with family at bedside. states that he has not had a good appetite for the last few months.  Prior to arriving he had nonbloody vomiting episodes.  He has been passing gas. Denies pain in abdomen and states that he has normal sensation in his abdominal area. His breathing feels ok, he has not been coughing recently. His other son, who is his primary caregiver will be at the hospital later today. ? ?Addendum ?Met with patient's son [name].  States that his father's had decreased appetite for the last few months and that all he wants to drink are in ensures and water. ?  ? ?Objective:  ?Vital signs in last 24 hours: ?Vitals:  ? 03/30/22 1045 03/30/22 1100 03/30/22 1200 03/30/22 1422  ?BP: 133/71 125/74 139/82 133/86  ?Pulse: 62 60 64 69  ?Resp: 13 12 16 16   ?Temp:    98.3 ?F (36.8 ?C)  ?TempSrc:    Oral  ?SpO2: 99% 100% 99% 99%  ? ?Supplemental O2: Room Air ?SpO2: 99 % ? ?Physical Exam:  ?Constitutional: Elderly appearing ?HENT: normocephalic atraumatic, temporal weight ?Eyes: conjunctiva non-erythematous ?Neck: supple ?Cardiovascular: regular rate and rhythm, no m/r/g ?Pulmonary/Chest: normal work of breathing on room air, lungs clear to auscultation bilaterally ?Abdominal: Firm, nontender, nondistended.  Tympanic to percussion. ?MSK: Sarcopenia ?Neurological: alert & oriented x 3 ?Skin: warm and dry ?Psych: Normal mood and thought process ? ? ?Intake/Output Summary (Last 24 hours) at 03/30/2022 1517 ?Last data filed at 03/30/2022 6734 ?Gross per 24 hour  ?Intake 1000 ml  ?Output 400 ml  ?Net 600 ml  ? ?Net IO Since Admission: 600 mL [03/30/22 1517] ? ?Pertinent Labs: ? ?  Latest Ref Rng & Units 03/30/2022  ?  4:05 AM 03/29/2022  ? 10:05 PM 03/22/2021  ?  4:57 PM  ?CBC  ?WBC 4.0 - 10.5 K/uL 19.4   22.9   10.3    ?Hemoglobin 13.0 - 17.0 g/dL 9.2   10.6   11.2    ?Hematocrit 39.0 - 52.0 % 29.7   34.3   34.5    ?Platelets 150  - 400 K/uL 344   360   358    ? ? ? ?  Latest Ref Rng & Units 03/30/2022  ?  4:05 AM 03/29/2022  ? 10:05 PM 03/22/2021  ?  4:57 PM  ?CMP  ?Glucose 70 - 99 mg/dL 112   110   124    ?BUN 8 - 23 mg/dL 54   56   19    ?Creatinine 0.61 - 1.24 mg/dL 2.93   3.25   1.50    ?Sodium 135 - 145 mmol/L 138   136   140    ?Potassium 3.5 - 5.1 mmol/L 4.3   4.4   3.2    ?Chloride 98 - 111 mmol/L 119   118   104    ?CO2 22 - 32 mmol/L 13   10   26     ?Calcium 8.9 - 10.3 mg/dL 8.6   9.5   8.5    ?Total Protein 6.5 - 8.1 g/dL  7.4     ?Total Bilirubin 0.3 - 1.2 mg/dL  0.6     ?Alkaline Phos 38 - 126 U/L  95     ?AST 15 - 41 U/L  23     ?ALT 0 - 44 U/L  24     ? ? ?Imaging: ?CT Abdomen  Pelvis Wo Contrast ? ?Result Date: 03/29/2022 ?CLINICAL DATA:  Abdominal pain. EXAM: CT ABDOMEN AND PELVIS WITHOUT CONTRAST TECHNIQUE: Multidetector CT imaging of the abdomen and pelvis was performed following the standard protocol without IV contrast. RADIATION DOSE REDUCTION: This exam was performed according to the departmental dose-optimization program which includes automated exposure control, adjustment of the mA and/or kV according to patient size and/or use of iterative reconstruction technique. COMPARISON:  None. FINDINGS: Lower chest: Moderate right pleural effusion. Hazy ground-glass opacities with peripheral distribution in bilateral lung bases. Calcific atherosclerotic disease of the coronary arteries. Hepatobiliary: Normal appearance of the liver. Large amount of sludge and gallstones within the gallbladder lumen. 9 mm calcific density along the posterior wall of the gallbladder may represent layering calculi or potentially wall calcifications. Pancreas: Unremarkable. No pancreatic ductal dilatation or surrounding inflammatory changes. Spleen: Normal in size without focal abnormality. Adrenals/Urinary Tract: Normal adrenal glands. Bilateral perinephric stranding. No evidence of hydronephrosis. 1 cm exophytic lower pole right renal cyst.  Stomach/Bowel: Stomach is within normal limits. Appendix appears normal. No evidence of bowel wall thickening, distention, or inflammatory changes. Nonspecific gaseous distension of the rectum. No evidence of volvulus. Vascular/Lymphatic: Aortic atherosclerosis. No enlarged abdominal or pelvic lymph nodes. Reproductive: Prostate is unremarkable. Other: No abdominal wall hernia or abnormality. No abdominopelvic ascites. Musculoskeletal: L5-S1 spondylosis. IMPRESSION: 1. Large amount of sludge and gallstones within the gallbladder lumen. 9 mm calcific density along the posterior wall of the gallbladder may represent layering calculi or potentially wall calcifications. Further evaluation with right upper quadrant ultrasound may be considered. 2. Nonspecific gaseous distension of the rectum. No evidence of volvulus. 3. Moderate right pleural effusion. 4. Hazy ground-glass opacities with peripheral distribution in bilateral lung bases. This may represent atypical/viral pneumonia. Aortic Atherosclerosis (ICD10-I70.0). Electronically Signed   By: Fidela Salisbury M.D.   On: 03/29/2022 20:02  ? ?DG Chest Port 1 View ? ?Result Date: 03/29/2022 ?CLINICAL DATA:  Cough EXAM: PORTABLE CHEST 1 VIEW COMPARISON:  05/27/2020 FINDINGS: The heart size and mediastinal contours are within normal limits. Both lungs are clear. The visualized skeletal structures are unremarkable. IMPRESSION: No active disease. Electronically Signed   By: Ulyses Jarred M.D.   On: 03/29/2022 22:01  ? ?US Abdomen Limited RUQ (LIVER/GB) ? ?Result Date: 03/29/2022 ?CLINICAL DATA:  Abdominal pain, abnormal CT EXAM: ULTRASOUND ABDOMEN LIMITED RIGHT UPPER QUADRANT COMPARISON:  CT today FINDINGS: Gallbladder: Layering stones and sludge within the gallbladder. No wall thickening or sonographic Murphy sign. Common bile duct: Diameter: Not visualized. Common bile duct was normal caliber on earlier CT. Liver: Difficult to visualize due to bowel gas. No visible focal  abnormality. Portal vein is patent on color Doppler imaging with normal direction of blood flow towards the liver. Other: None. IMPRESSION: Cholelithiasis.  No sonographic evidence of acute cholecystitis. Limited study due to bowel gas. Electronically Signed   By: Rolm Baptise M.D.   On: 03/29/2022 21:11   ? ?Assessment/Plan:  ? ?Principal Problem: ?  Acute renal failure superimposed on stage 3a chronic kidney disease ([place]) ?Active Problems: ?  Essential hypertension ?  Macrocytic anemia ?  Moderate protein-calorie malnutrition ([place]) ?  Metabolic acidosis, normal anion gap (NAG) ?  Biliary sludge ?  Gastroenteritis ? ?Patient Summary: ?Terry Barton is a 66 y.o. 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 ?Acute on chronic renal failure ?Suspect secondary to his  acute nausea and vomiting and poor p.o. intake over the past few weeks.  No granular casts on microscopy, will start maintenance fluids in hopes to prevent ATN.  He is not actively vomiting since his admission.  CT abdomen pelvis without contrast showed no evidence of hydronephrosis, he did have bilateral perinephric stranding.  Low suspicion for bilateral nephrolithiasis or pyelonephritis. ?-Daily BMP ?-Additional LR infusion 75 cc/hr for 5 hour ?-Restart diet this evening, depending on GI recs possibly NPO at midnight ?-We will check in with nurse to make sure he is urinating appropriately ? ?Nausea vomiting food aversion ?Abnormal HIDA scan ?Patient with decreased appetite nausea vomiting recently.  CT abdomen pelvis with evidence of large amounts of sludge and gallstones within the gallbladder lumen.  Right upper quadrant ultrasound with evidence of cholelithiasis but no evidence of acute cholecystitis. They have had a difficult time visualizing the common bile duct so a HIDA scan was performed.  HIDA with  normal filling of the gallbladder however no contrast had entered the small bowel and radiotracer was seen more distal in the common bile duct.  Unable to rule out a distal common bile duct gallstone.  Will c

## 2022-03-31 DIAGNOSIS — N1831 Chronic kidney disease, stage 3a: Secondary | ICD-10-CM | POA: Diagnosis not present

## 2022-03-31 DIAGNOSIS — N179 Acute kidney failure, unspecified: Secondary | ICD-10-CM | POA: Diagnosis not present

## 2022-03-31 LAB — COMPREHENSIVE METABOLIC PANEL
ALT: 19 U/L (ref 0–44)
AST: 15 U/L (ref 15–41)
Albumin: 2.3 g/dL — ABNORMAL LOW (ref 3.5–5.0)
Alkaline Phosphatase: 84 U/L (ref 38–126)
Anion gap: 8 (ref 5–15)
BUN: 45 mg/dL — ABNORMAL HIGH (ref 8–23)
CO2: 14 mmol/L — ABNORMAL LOW (ref 22–32)
Calcium: 8.4 mg/dL — ABNORMAL LOW (ref 8.9–10.3)
Chloride: 116 mmol/L — ABNORMAL HIGH (ref 98–111)
Creatinine, Ser: 3.03 mg/dL — ABNORMAL HIGH (ref 0.61–1.24)
GFR, Estimated: 22 mL/min — ABNORMAL LOW (ref >60)
Glucose, Bld: 76 mg/dL (ref 70–99)
Potassium: 3.3 mmol/L — ABNORMAL LOW (ref 3.5–5.1)
Sodium: 138 mmol/L (ref 135–145)
Total Bilirubin: 1 mg/dL (ref 0.3–1.2)
Total Protein: 6.5 g/dL (ref 6.5–8.1)

## 2022-03-31 LAB — CBC WITH DIFFERENTIAL/PLATELET
Abs Immature Granulocytes: 0.33 10*3/uL — ABNORMAL HIGH (ref 0.00–0.07)
Basophils Absolute: 0.1 10*3/uL (ref 0.0–0.1)
Basophils Relative: 1 %
Eosinophils Absolute: 0.5 10*3/uL (ref 0.0–0.5)
Eosinophils Relative: 3 %
HCT: 28.8 % — ABNORMAL LOW (ref 39.0–52.0)
Hemoglobin: 8.9 g/dL — ABNORMAL LOW (ref 13.0–17.0)
Immature Granulocytes: 2 %
Lymphocytes Relative: 12 %
Lymphs Abs: 1.8 10*3/uL (ref 0.7–4.0)
MCH: 30.6 pg (ref 26.0–34.0)
MCHC: 30.9 g/dL (ref 30.0–36.0)
MCV: 99 fL (ref 80.0–100.0)
Monocytes Absolute: 1.1 10*3/uL — ABNORMAL HIGH (ref 0.1–1.0)
Monocytes Relative: 8 %
Neutro Abs: 10.7 10*3/uL — ABNORMAL HIGH (ref 1.7–7.7)
Neutrophils Relative %: 74 %
Platelets: 335 10*3/uL (ref 150–400)
RBC: 2.91 MIL/uL — ABNORMAL LOW (ref 4.22–5.81)
RDW: 14.6 % (ref 11.5–15.5)
WBC: 14.5 10*3/uL — ABNORMAL HIGH (ref 4.0–10.5)
nRBC: 0 % (ref 0.0–0.2)

## 2022-03-31 MED ORDER — LIP MEDEX EX OINT
TOPICAL_OINTMENT | CUTANEOUS | Status: DC | PRN
  Filled 2022-03-31: qty 7

## 2022-03-31 MED ORDER — LACTATED RINGERS IV SOLN
INTRAVENOUS | Status: AC
Start: 2022-03-31 — End: 2022-03-31

## 2022-03-31 MED ORDER — ENSURE ENLIVE PO LIQD
237.0000 mL | Freq: Three times a day (TID) | ORAL | Status: DC
  Administered 2022-03-31 – 2022-04-04 (×10): 237 mL via ORAL
  Filled 2022-03-31: qty 237

## 2022-03-31 NOTE — Evaluation (Signed)
Occupational Therapy Evaluation ?Patient Details ?Name: Terry Barton ?MRN: [id number] ?DOB: October 28, 1956 ?Today's Date: 03/31/2022 ? ? ?History of Present Illness 66 yo male presenting to ED on 4/22 with nausea and vomiting. Amitted for acute on chronic renal failure. PMH including cervical spine injury, CVA with residual right-sided deficits, HTN, hypothyroidism, T2DM, CKD IIIb, and bowel/bladder incontinence.  ? ?Clinical Impression ?  ?PTA, pt was living with his son who assisted with all ADLs and use of hoyer lift to/from power wheelchair. Pt independent with use of power wheelchair. Pt presenting close to baseline function requiring Total A for bed mobility, sitting at EOB, and ADLs. Pt presenting with abdominal discomfort and decreased functional performance. Pt would benefit from further acute OT to prevent deconditioning and facilitate safe dc. Recommend dc to home once medically stable per physician.  ? ?Recommendations for follow up therapy are one component of a multi-disciplinary discharge planning process, led by the attending physician.  Recommendations may be updated based on patient status, additional functional criteria and insurance authorization.  ? ?Follow Up Recommendations ? No OT follow up  ?  ?Assistance Recommended at Discharge Frequent or constant Supervision/Assistance  ?Patient can return home with the following   ? ?  ?Functional Status Assessment ? Patient has had a recent decline in their functional status and demonstrates the ability to make significant improvements in function in a reasonable and predictable amount of time.  ?Equipment Recommendations ? Hospital bed [facility] bed that has trendelenburg ability.)  ?  ?Recommendations for Other Services   ? ? ?  ?Precautions / Restrictions Precautions ?Precautions: Fall  ? ?  ? ?Mobility Bed Mobility ?Overal bed mobility: Needs Assistance ?Bed Mobility: Rolling, Sidelying to Sit, Sit to Sidelying ?Rolling: Total assist ?Sidelying to sit:  Total assist ?  ?  ?Sit to sidelying: Total assist ?General bed mobility comments: Total A +2 for bed mobility ?  ? ?Transfers ?  ?  ?  ?  ?  ?  ?  ?  ?  ?General transfer comment: Defer ?  ? ?  ?Balance Overall balance assessment: Needs assistance ?Sitting-balance support: No upper extremity supported, Feet supported ?Sitting balance-Leahy Scale: Zero ?  ?  ?  ?  ?  ?  ?  ?  ?  ?  ?  ?  ?  ?  ?  ?  ?   ? ?ADL either performed or assessed with clinical judgement  ? ?ADL Overall ADL's : At baseline;Needs assistance/impaired ?  ?Eating/Feeding Details (indicate cue type and reason): Able to bring drinks to mouth with LUE ?  ?  ?  ?  ?  ?  ?  ?  ?  ?  ?  ?  ?  ?  ?  ?  ?  ?General ADL Comments: Total A for ADLs, sitting balance, and bed mobility.  ? ? ? ?Vision   ?   ?   ?Perception   ?  ?Praxis   ?  ? ?Pertinent Vitals/Pain Pain Assessment ?Pain Assessment: Faces ?Faces Pain Scale: Hurts little more ?Pain Location: Stomach/back ?Pain Descriptors / Indicators: Discomfort, Grimacing ?Pain Intervention(s): Monitored during session, Repositioned, Limited activity within patient's tolerance  ? ? ? ?Hand Dominance   ?  ?Extremity/Trunk Assessment Upper Extremity Assessment ?Upper Extremity Assessment: Generalized weakness;RUE deficits/detail ?RUE Deficits / Details: No active movement of RUE ?  ?Lower Extremity Assessment ?Lower Extremity Assessment: Defer to PT evaluation ?  ?Cervical / Trunk Assessment ?Cervical / Trunk Assessment: Other exceptions ?Cervical /  Trunk Exceptions: Prior cervical spine injury ?  ?Communication Communication ?Communication: HOH ?  ?Cognition Arousal/Alertness: Awake/alert ?Behavior During Therapy: [facility] for tasks assessed/performed ?Overall Cognitive Status: Impaired/Different from baseline ?Area of Impairment: Problem solving, Following commands ?  ?  ?  ?  ?  ?  ?  ?  ?  ?  ?  ?Following Commands: Follows one step commands with increased time ?  ?  ?Problem Solving: Slow processing ?General  Comments: Requiring increased time for following commands and problem solving ?  ?  ?General Comments  Son and family present; providing home information and PLOF. Very supportive family ? ?  ?Exercises   ?  ?Shoulder Instructions    ? ? ?Home Living Family/patient expects to be discharged to:: Private residence ?Living Arrangements: Children ?Available Help at Discharge: Family;Available 24 hours/day ?Type of Home: House ?Home Access: Ramped entrance (portable) ?  ?  ?Home Layout: Able to live on main level with bedroom/bathroom ?  ?  ?  ?  ?  ?  ?  ?Home Equipment: Wheelchair - manual;Wheelchair - power;Other (comment);Hospital bed (hoyer) ?  ?Additional Comments: lives with son and family ?  ? ?  ?Prior Functioning/Environment Prior Level of Function : Needs assist ?  ?  ?  ?Physical Assist : Mobility (physical);ADLs (physical) ?Mobility (physical): Transfers;Bed mobility ?ADLs (physical): Grooming;Bathing;Dressing;Toileting;IADLs ?Mobility Comments: family cleans him up in the morning and hoyer lifts him to his power chair. Pt independent with mobility in power chair. Wears depends for toileting ?ADLs Comments: Family assists with all ADLs. performing bathing, dressing and toilet hygiene at bed level. ?  ? ?  ?  ?OT Problem List: Decreased strength;Decreased range of motion;Decreased activity tolerance;Impaired balance (sitting and/or standing);Decreased knowledge of use of DME or AE;Decreased knowledge of precautions ?  ?   ?OT Treatment/Interventions: Self-care/ADL training;Therapeutic exercise;Energy conservation;DME and/or AE instruction;Therapeutic activities;Patient/family education  ?  ?OT Goals(Current goals can be found in the care plan section) Acute Rehab OT Goals ?Patient Stated Goal: Go home ?OT Goal Formulation: With patient/family ?Time For Goal Achievement: 04/14/22 ?Potential to Achieve Goals: Good  ?OT Frequency: Min 1X/week ?  ? ?Co-evaluation   ?  ?  ?  ?  ? ?  ?AM-PAC OT "6 Clicks" Daily  Activity     ?Outcome Measure Help from another person eating meals?: A Lot ?Help from another person taking care of personal grooming?: Total ?Help from another person toileting, which includes using toliet, bedpan, or urinal?: Total ?Help from another person bathing (including washing, rinsing, drying)?: Total ?Help from another person to put on and taking off regular upper body clothing?: Total ?Help from another person to put on and taking off regular lower body clothing?: Total ?6 Click Score: 7 ?  ?End of Session Nurse Communication: Mobility status ? ?Activity Tolerance: Patient tolerated treatment well ?Patient left: in bed;with call bell/phone within reach;with nursing/sitter in room ? ?OT Visit Diagnosis: Unsteadiness on feet (R26.81);Other abnormalities of gait and mobility (R26.89);Muscle weakness (generalized) (M62.81)  ?              ?Time: [id number] ?OT Time Calculation (min): 24 min ?Charges:  OT General Charges ?$OT Visit: 1 Visit ?OT Evaluation ?$OT Eval Moderate Complexity: 1 Mod ? ?Cyndie Woodbeck MSOT, OTR/L ?Acute Rehab ?Pager: [phone] ?Office: [phone] ? ?Terry Barton M Ahmaya Ostermiller ?03/31/2022, 4:50 PM ?

## 2022-03-31 NOTE — Progress Notes (Signed)
Initial Nutrition Assessment ? ?DOCUMENTATION CODES:  ? ?Not applicable ? ?INTERVENTION:  ? ?Multivitamin w/ minerals daily ?Ensure Enlive po TID, each supplement provides 350 kcal and 20 grams of protein. ?Recommend obtaining weight. RN notified ? ?NUTRITION DIAGNOSIS:  ? ?Increased nutrient needs related to wound healing as evidenced by estimated needs. ? ?GOAL:  ? ?Patient will meet greater than or equal to 90% of their needs ? ?MONITOR:  ? ?PO intake, Supplement acceptance, Labs, Skin, Weight trends ? ?REASON FOR ASSESSMENT:  ? ?Malnutrition Screening Tool ?  ? ?ASSESSMENT:  ? ?66 y.o. male presented to the ED with abdominal pain, nausea and vomiting. PMH includes HTN, ICH, CKD IIIb, T2DM, and a spinal cord injury. Pt admitted with gastroenteritis, bacteruria, and AKI on CKD.  ? ?Pt reports that his appetite has been poor for a while now. States that his intake has also decreased since getting his teeth pulled and now has dentures that he is still trying to get use to. States that he believes that they fit well and does not have issues with them being ill-fitting.  Pt reports that he drinks 1-2 Ensure's per day at home.  ? ?Pt endorses a 10# weight loss recently,unsure of time frames and of his UBW. Noted in MD notes, pt is wheelchair bound. Pt has not been weighed this admission.  ? ?Medications reviewed and include: MVI, Protonix, IV antibiotics ?Labs reviewed: Potassium 3.3, BUN 45, Creatinine 3.03  ? ?NUTRITION - FOCUSED PHYSICAL EXAM: ? ?Deferred to follow-up due to pt refused.  ? ?Diet Order:   ?Diet Order   ? ?       ?  Diet regular Room service appropriate? Yes; Fluid consistency: Thin  Diet effective now       ?  ? ?  ?  ? ?  ? ? ?EDUCATION NEEDS:  ? ?No education needs have been identified at this time ? ?Skin:  Skin Assessment: Skin Integrity Issues: ?Skin Integrity Issues:: Unstageable ?Unstageable: Sacrum ? ?Last BM:  4/24 ? ?Height:  ? ?Ht Readings from Last 1 Encounters:  ?03/22/21 6' (1.829 m)   ? ? ?Weight:  ? ?Wt Readings from Last 1 Encounters:  ?03/22/21 102.1 kg  ? ? ?Ideal Body Weight:  80.9 kg ? ?BMI:  There is no height or weight on file to calculate BMI. ? ?Estimated Nutritional Needs:  ? ?Kcal:  2000-2200 ? ?Protein:  100-115 grams ? ?Fluid:  >/= 2 L ? ? ? ?Kirby Crigler RD, LDN ?Clinical Dietitian ?See AMiON for contact information.  ? ?

## 2022-03-31 NOTE — Evaluation (Signed)
Physical Therapy Evaluation ?Patient Details ?Name: Terry Barton ?MRN: [id number] ?DOB: [birth year] ?Today's Date: 03/31/2022 ? ?History of Present Illness ? 66 yo male presenting to ED on 4/22 with nausea and vomiting. Amitted for acute on chronic renal failure. PMH including cervical spine injury, CVA with residual right-sided deficits, HTN, hypothyroidism, T2DM, CKD IIIb, and bowel/bladder incontinence.  ?Clinical Impression ? PTA pt living with son and family, in ramped home with bed and bath on first floor. Pt's son reports that he and his brother assist in his father's care and that after they bathe him in the morning they lift him to his power chair using hoyer lift. Once in power chair pt is independent with mobility. Pt wears adult diapers and needs to be lifted back to bed to be cleaned after toileting. Family provides for all iADLs and ADLs. Pt is currently total A for rolling and getting to and from EoB. Pt requires mod-total A for balancing EoB. Does have reflexive movement of L arm when trunk becomes unbalanced but unable to hold his weight. Pt would benefit from updated hospital bed to aide in bed mobility, however will not require any further PT services at discharge. Pt would benefit from PT acutely to insure pt able to maintain his core strength and L UE mobility to be able to operate his power chair at discharge.    ?   ? ?Recommendations for follow up therapy are one component of a multi-disciplinary discharge planning process, led by the attending physician.  Recommendations may be updated based on patient status, additional functional criteria and insurance authorization. ? ?Follow Up Recommendations No PT follow up ? ?  ?Assistance Recommended at Discharge Frequent or constant Supervision/Assistance  ?Patient can return home with the following ? Two people to help with walking and/or transfers;Two people to help with bathing/dressing/bathroom;A lot of help with walking and/or transfers;Assistance  with cooking/housework;Assistance with feeding;Direct supervision/assist for medications management;Direct supervision/assist for financial management;Assist for transportation;Help with stairs or ramp for entrance ? ?  ?Equipment Recommendations Hospital bed (updated hospital bed with trendelenberg)  ?   ?Functional Status Assessment Patient has had a recent decline in their functional status and demonstrates the ability to make significant improvements in function in a reasonable and predictable amount of time.  ? ?  ?Precautions / Restrictions Precautions ?Precautions: Fall  ? ?  ? ?Mobility ? Bed Mobility ?Overal bed mobility: Needs Assistance ?Bed Mobility: Rolling, Sidelying to Sit, Sit to Sidelying ?Rolling: Total assist ?Sidelying to sit: Total assist ?  ?  ?Sit to sidelying: Total assist ?General bed mobility comments: Total A +2 for bed mobility ?  ? ?Transfers ?  ?  ?  ?  ?  ?  ?  ?  ?  ?General transfer comment: Defer ?  ? ? ?  ? ?Balance Overall balance assessment: Needs assistance ?Sitting-balance support: No upper extremity supported, Feet supported ?Sitting balance-Leahy Scale: Zero ?  ?  ?  ?  ?  ?  ?  ?  ?  ?  ?  ?  ?  ?  ?  ?  ?   ? ? ? ?Pertinent Vitals/Pain Pain Assessment ?Pain Assessment: Faces ?Faces Pain Scale: Hurts little more ?Pain Location: Stomach/back ?Pain Descriptors / Indicators: Discomfort, Grimacing ?Pain Intervention(s): Limited activity within patient's tolerance, Monitored during session, Repositioned  ? ? ?Home Living Family/patient expects to be discharged to:: Private residence ?Living Arrangements: Children ?Available Help at Discharge: Family;Available 24 hours/day ?Type of Home: House ?Home Access:  Ramped entrance (portable) ?  ?  ?  ?Home Layout: Able to live on main level with bedroom/bathroom ?Home Equipment: Wheelchair - manual;Wheelchair - power;Other (comment);Hospital bed (hoyer) ?Additional Comments: lives with son and family  ?  ?Prior Function Prior Level of  Function : Needs assist ?  ?  ?  ?Physical Assist : Mobility (physical);ADLs (physical) ?Mobility (physical): Transfers;Bed mobility ?ADLs (physical): Grooming;Bathing;Dressing;Toileting;IADLs ?Mobility Comments: family cleans him up in the morning and hoyer lifts him to his power chair. Pt independent with mobility in power chair. Wears depends for toileting ?ADLs Comments: Family assists with all ADLs. performing bathing, dressing and toilet hygiene at bed level. ?  ? ? ?Hand Dominance  ? Dominant Hand: Left ? ?  ?Extremity/Trunk Assessment  ? Upper Extremity Assessment ?Upper Extremity Assessment: Defer to OT evaluation ?RUE Deficits / Details: No active movement of RUE ?  ? ?Lower Extremity Assessment ?Lower Extremity Assessment: Generalized weakness;RLE deficits/detail ?RLE Deficits / Details: no active movement ?  ? ?Cervical / Trunk Assessment ?Cervical / Trunk Assessment: Other exceptions ?Cervical / Trunk Exceptions: Prior cervical spine injury  ?Communication  ? Communication: HOH  ?Cognition Arousal/Alertness: Awake/alert ?Behavior During Therapy: [facility] for tasks assessed/performed ?Overall Cognitive Status: Impaired/Different from baseline ?Area of Impairment: Problem solving, Following commands ?  ?  ?  ?  ?  ?  ?  ?  ?  ?  ?  ?Following Commands: Follows one step commands with increased time ?  ?  ?Problem Solving: Slow processing ?General Comments: Requiring increased time for following commands and problem solving ?  ?  ? ?  ?General Comments General comments (skin integrity, edema, etc.): Son and family present; providing home information and PLOF. Very supportive family ? ?  ?   ? ?Assessment/Plan  ?  ?PT Assessment Patient needs continued PT services  ?PT Problem List Decreased range of motion;Decreased strength ? ?   ?  ?PT Treatment Interventions Functional mobility training;Therapeutic exercise   ? ?PT Goals (Current goals can be found in the Care Plan section)  ?Acute Rehab PT Goals ?Patient  Stated Goal: go fishing ?PT Goal Formulation: With patient/family ?Time For Goal Achievement: 04/14/22 ?Potential to Achieve Goals: Fair ? ?  ?Frequency Min 2X/week ?  ? ? ?Co-evaluation PT/OT/SLP Co-Evaluation/Treatment: Yes ?Reason for Co-Treatment: Complexity of the patient's impairments (multi-system involvement) ?  ?  ?  ? ? ?  ?AM-PAC PT "6 Clicks" Mobility  ?Outcome Measure Help needed turning from your back to your side while in a flat bed without using bedrails?: Total ?Help needed moving from lying on your back to sitting on the side of a flat bed without using bedrails?: Total ?Help needed moving to and from a bed to a chair (including a wheelchair)?: Total ?Help needed standing up from a chair using your arms (e.g., wheelchair or bedside chair)?: Total ?Help needed to walk in hospital room?: Total ?Help needed climbing 3-5 steps with a railing? : Total ?6 Click Score: 6 ? ?  ?End of Session   ?Activity Tolerance: Patient tolerated treatment well ?Patient left: in bed;with call bell/phone within reach;with bed alarm set ?Nurse Communication: Mobility status;Other (comment) (need for replacement of sacral foam) ?PT Visit Diagnosis: Muscle weakness (generalized) (M62.81);Other abnormalities of gait and mobility (R26.89) ?  ? ?Time: [id number] ?PT Time Calculation (min) (ACUTE ONLY): 24 min ? ? ?Charges:   PT Evaluation ?$PT Eval Moderate Complexity: 1 Mod ?  ?  ?   ? ? ?Osborne Serio B. [employer] PT,  DPT ?Acute Rehabilitation Services ?Pager [phone] ?Office [phone] ? ? ?[place] ?03/31/2022, 5:20 PM ? ?

## 2022-03-31 NOTE — Progress Notes (Addendum)
? ? ?HD#1 ?Subjective:  ?Overnight Events: Admitted overnight ? ?Subjective: Patient seen and evaluated bedside.  States he is doing well.  Denies any continued nausea, vomiting, no urinary symptoms.  Denies pain or fevers. ?  ? ?Objective:  ?Vital signs in last 24 hours: ?Vitals:  ? 03/30/22 2328 03/31/22 0324 03/31/22 0807 03/31/22 1202  ?BP: (!) 152/71 124/68 123/64 113/68  ?Pulse: 70 66 60 66  ?Resp: 20 16 12 14   ?Temp: 98.1 ?F (36.7 ?C) 98 ?F (36.7 ?C) 97.8 ?F (36.6 ?C) 98 ?F (36.7 ?C)  ?TempSrc: Oral Oral Oral Axillary  ?SpO2: 98% 99% 97% 99%  ? ?Supplemental O2: Room Air ?SpO2: 99 % ? ?Physical Exam:  ?Constitutional: Elderly appearing ?HENT: normocephalic atraumatic, temporal wasting, edentulous ?Eyes: conjunctiva non-erythematous ?Neck: supple ?Cardiovascular: regular rate and rhythm, no m/r/g ?Pulmonary/Chest: normal work of breathing on room air, lungs clear to auscultation bilaterally ?Abdominal: Soft, nontender, nondistended.  Tympanic to percussion. ?MSK: Sarcopenia ?Neurological: alert & oriented x 3 ?Skin: warm and dry ?Psych: Normal mood and thought process ? ? ?Intake/Output Summary (Last 24 hours) at 03/31/2022 1521 ?Last data filed at 03/31/2022 1318 ?Gross per 24 hour  ?Intake 100 ml  ?Output 1750 ml  ?Net -1650 ml  ? ?Net IO Since Admission: -1,050 mL [03/31/22 1521] ? ?Pertinent Labs: ? ?  Latest Ref Rng & Units 03/31/2022  ?  1:42 AM 03/30/2022  ?  4:05 AM 03/29/2022  ? 10:05 PM  ?CBC  ?WBC 4.0 - 10.5 K/uL 14.5   19.4   22.9    ?Hemoglobin 13.0 - 17.0 g/dL 8.9   9.2   10.6    ?Hematocrit 39.0 - 52.0 % 28.8   29.7   34.3    ?Platelets 150 - 400 K/uL 335   344   360    ? ? ? ?  Latest Ref Rng & Units 03/31/2022  ?  1:42 AM 03/30/2022  ?  4:05 AM 03/29/2022  ? 10:05 PM  ?CMP  ?Glucose 70 - 99 mg/dL 76   112   110    ?BUN 8 - 23 mg/dL 45   54   56    ?Creatinine 0.61 - 1.24 mg/dL 3.03   2.93   3.25    ?Sodium 135 - 145 mmol/L 138   138   136    ?Potassium 3.5 - 5.1 mmol/L 3.3   4.3   4.4    ?Chloride 98  - 111 mmol/L 116   119   118    ?CO2 22 - 32 mmol/L 14   13   10     ?Calcium 8.9 - 10.3 mg/dL 8.4   8.6   9.5    ?Total Protein 6.5 - 8.1 g/dL 6.5    7.4    ?Total Bilirubin 0.3 - 1.2 mg/dL 1.0    0.6    ?Alkaline Phos 38 - 126 U/L 84    95    ?AST 15 - 41 U/L 15    23    ?ALT 0 - 44 U/L 19    24    ? ? ?Imaging: ?No results found. ? ?Assessment/Plan:  ? ?Principal Problem: ?  Acute renal failure superimposed on stage 3a chronic kidney disease ([place]) ?Active Problems: ?  Essential hypertension ?  Macrocytic anemia ?  Moderate protein-calorie malnutrition ([place]) ?  Metabolic acidosis, normal anion gap (NAG) ?  Biliary sludge ?  Gastroenteritis ? ? ?Patient Summary: ?Terry Barton is a 66 y.o. living with  a history of cervical spine injury, chronic right-sided neurological deficits from prior CVA who presented with nausea and vomiting and admitted for acute on chronic renal failure ? ?Acute on chronic renal failure ?Renal function roughly the same, creatinine 3.03 BUN 45.  Potassium 3.3 this morning.  He put out approximately 1.3 L urine output yesterday.  Due to concern for urinary retention, bladder scan was obtained and showed over 400 cc urine.  Patient was in and out cathed.  We will monitor daily bladder scans.  Can consider Foley catheter if patient continues to retain. At risk for ATN. ?- Urine cultures showing greater than 100,000 colonies of E. coli and greater than 100,000 colonies of group B strep.  Patient has been receiving Zosyn which should cover for strep.  Can likely transition to cefazolin pending sensitivities. ?- LR 125 ml/hr ? ?Nausea vomiting food aversion ?Abnormal HIDA scan ?Patient's GI complaints have largely resolved.  Work-up thus far has been negative.  GI evaluated the patient and felt his symptoms were unrelated to any intra-abdominal etiology.  Patient has cholelithiasis with biliary sludge, likely to have chronic cholecystitis which cab be followed outpatient, but he is overall a poor  surgical candidate due to comorbidities. ? ?Macrocytic anemia ?B12 normal.  We will supplement folate with malnutrition.  We will also add multivitamin. ? ?Severe non-anion gap metabolic acidosis ?Most likely secondary to his his acute on chronic renal failure and severe dehydration.  Hopeful that this will improve with continued LR infusion.  If no improvement can continue with oral bicarb tabs. ?-CMP daily ?-LR infusion ? ? ?Terry Barton ?Internal Medicine Resident PGY-1 ?Pager [phone] ?Please contact the on call pager after 5 pm and on weekends at [phone]. ? ?

## 2022-03-31 NOTE — Progress Notes (Signed)
?  Transition of Care (TOC) Screening Note ? ? ?Patient Details  ?Name: Terry Barton ?Date of Birth: Mar 25, 1956 ? ? ?Transition of Care (TOC) CM/SW Contact:    ?Harriet Masson, RN ?Phone Number: ?03/31/2022, 9:24 AM ? ? ? ?Transition of Care Department [facility]) has reviewed patient and no TOC needs have been identified at this time. We will continue to monitor patient advancement through interdisciplinary progression rounds. If new patient transition needs arise, please place a TOC consult. ? ? ?

## 2022-04-01 LAB — BASIC METABOLIC PANEL
Anion gap: 13 (ref 5–15)
Anion gap: 8 (ref 5–15)
BUN: 42 mg/dL — ABNORMAL HIGH (ref 8–23)
BUN: 38 mg/dL — ABNORMAL HIGH (ref 8–23)
CO2: 10 mmol/L — ABNORMAL LOW (ref 22–32)
CO2: 14 mmol/L — ABNORMAL LOW (ref 22–32)
Calcium: 7.9 mg/dL — ABNORMAL LOW (ref 8.9–10.3)
Calcium: 8 mg/dL — ABNORMAL LOW (ref 8.9–10.3)
Chloride: 110 mmol/L (ref 98–111)
Chloride: 114 mmol/L — ABNORMAL HIGH (ref 98–111)
Creatinine, Ser: 2.81 mg/dL — ABNORMAL HIGH (ref 0.61–1.24)
Creatinine, Ser: 3.01 mg/dL — ABNORMAL HIGH (ref 0.61–1.24)
GFR, Estimated: 24 mL/min — ABNORMAL LOW (ref >60)
GFR, Estimated: 22 mL/min — ABNORMAL LOW (ref >60)
Glucose, Bld: 112 mg/dL — ABNORMAL HIGH (ref 70–99)
Glucose, Bld: 132 mg/dL — ABNORMAL HIGH (ref 70–99)
Potassium: 2.9 mmol/L — ABNORMAL LOW (ref 3.5–5.1)
Potassium: 3 mmol/L — ABNORMAL LOW (ref 3.5–5.1)
Sodium: 133 mmol/L — ABNORMAL LOW (ref 135–145)
Sodium: 136 mmol/L (ref 135–145)

## 2022-04-01 LAB — CBC
HCT: 29.9 % — ABNORMAL LOW (ref 39.0–52.0)
Hemoglobin: 9.2 g/dL — ABNORMAL LOW (ref 13.0–17.0)
MCH: 31.1 pg (ref 26.0–34.0)
MCHC: 30.8 g/dL (ref 30.0–36.0)
MCV: 101 fL — ABNORMAL HIGH (ref 80.0–100.0)
Platelets: 332 10*3/uL (ref 150–400)
RBC: 2.96 MIL/uL — ABNORMAL LOW (ref 4.22–5.81)
RDW: 14.8 % (ref 11.5–15.5)
WBC: 17.3 10*3/uL — ABNORMAL HIGH (ref 4.0–10.5)
nRBC: 0.1 % (ref 0.0–0.2)

## 2022-04-01 LAB — URINALYSIS, ROUTINE W REFLEX MICROSCOPIC
Bilirubin Urine: NEGATIVE
Glucose, UA: NEGATIVE mg/dL
Ketones, ur: NEGATIVE mg/dL
Nitrite: NEGATIVE
Protein, ur: NEGATIVE mg/dL
Specific Gravity, Urine: 1.009 (ref 1.005–1.030)
WBC, UA: >50 WBC/hpf — ABNORMAL HIGH (ref 0–5)
pH: 5 (ref 5.0–8.0)

## 2022-04-01 LAB — URINE CULTURE: Culture: >=100000 — AB

## 2022-04-01 LAB — ALBUMIN: Albumin: 2.2 g/dL — ABNORMAL LOW (ref 3.5–5.0)

## 2022-04-01 LAB — PHOSPHORUS: Phosphorus: 2.3 mg/dL — ABNORMAL LOW (ref 2.5–4.6)

## 2022-04-01 LAB — SODIUM, URINE, RANDOM: Sodium, Ur: 31 mmol/L

## 2022-04-01 MED ORDER — POTASSIUM CHLORIDE 20 MEQ PO PACK
40.0000 meq | PACK | Freq: Two times a day (BID) | ORAL | Status: DC
  Administered 2022-04-01 – 2022-04-02 (×2): 40 meq via ORAL
  Filled 2022-04-01 (×2): qty 2

## 2022-04-01 MED ORDER — POTASSIUM CHLORIDE 10 MEQ/100ML IV SOLN
10.0000 meq | INTRAVENOUS | Status: AC
  Administered 2022-04-01 (×2): 10 meq via INTRAVENOUS
  Filled 2022-04-01 (×2): qty 100

## 2022-04-01 MED ORDER — CEFAZOLIN SODIUM-DEXTROSE 1-4 GM/50ML-% IV SOLN
1.0000 g | Freq: Two times a day (BID) | INTRAVENOUS | Status: AC
  Administered 2022-04-01 – 2022-04-03 (×4): 1 g via INTRAVENOUS
  Filled 2022-04-01 (×4): qty 50

## 2022-04-01 NOTE — Progress Notes (Signed)
PHARMACY NOTE:  ANTIMICROBIAL RENAL DOSAGE ADJUSTMENT ? ?Current antimicrobial regimen includes a mismatch between antimicrobial dosage and estimated renal function.  As per policy approved by the Pharmacy & Therapeutics and Medical Executive Committees, the antimicrobial dosage will be adjusted accordingly. ? ?Current antimicrobial dosage:  Ancef 1gm IV q8h ? ?Indication: UTI ? ?Renal Function: ? ?Estimated Creatinine Clearance: 28 mL/min (A) (by C-G formula based on SCr of 3.01 mg/dL (H)). ?   ?Antimicrobial dosage has been changed to:  Ancef 1gm IV q12h ? ?Additional comments: ? ? ?Thank you for allowing pharmacy to be a part of this patient's care. ? ?Christoper Fabian, PharmD, BCPS ?Please see amion for complete clinical pharmacist phone list ?04/01/2022 4:54 PM ?

## 2022-04-01 NOTE — Plan of Care (Signed)
  Problem: Education: Goal: Knowledge of General Education information will improve Description Including pain rating scale, medication(s)/side effects and non-pharmacologic comfort measures Outcome: Progressing   Problem: Health Behavior/Discharge Planning: Goal: Ability to manage health-related needs will improve Outcome: Progressing   

## 2022-04-01 NOTE — Progress Notes (Signed)
? ? ?HD#2 ?Subjective:  ?Overnight Events: Admitted overnight ? ?Subjective: Seen and evaluated bedside.  States he is doing well.  Denies any cough, nausea vomiting diarrhea, no urinary symptoms.  He has been urinating well.  Denies pain.  States appetite is doing well and he is eager to eat now that he has dentures today ?  ? ?Objective:  ?Vital signs in last 24 hours: ?Vitals:  ? 04/01/22 0400 04/01/22 0839 04/01/22 1138 04/01/22 1535  ?BP: 121/63 129/67 123/64 129/69  ?Pulse: 72 65 67 66  ?Resp: 20 20 17 15   ?Temp: 98 ?F (36.7 ?C) 97.6 ?F (36.4 ?C) 98.4 ?F (36.9 ?C) 98.6 ?F (37 ?C)  ?TempSrc: Oral Oral Oral Oral  ?SpO2: 97% 99% 99% 99%  ? ?Supplemental O2: Room Air ?SpO2: 99 % ? ?Physical Exam:  ?Constitutional: Elderly appearing ?HENT: normocephalic atraumatic, temporal wasting, edentulous, dentures in place with ?Eyes: conjunctiva non-erythematous ?Neck: supple ?Cardiovascular: regular rate and rhythm, no m/r/g ?Pulmonary/Chest: normal work of breathing on room air, lungs clear to auscultation bilaterally ?Abdominal: Soft, nontender, nondistended.  Tympanic to percussion. ?MSK: Sarcopenia ?Neurological: alert & oriented x 3 ?Skin: warm and dry ?Psych: Normal mood and thought process ? ? ?Intake/Output Summary (Last 24 hours) at 04/01/2022 1604 ?Last data filed at 04/01/2022 [phone] ?Gross per 24 hour  ?Intake 240 ml  ?Output 200 ml  ?Net 40 ml  ? ?Net IO Since Admission: -1,010 mL [04/01/22 1604] ? ?Pertinent Labs: ? ?  Latest Ref Rng & Units 04/01/2022  ? 12:48 AM 03/31/2022  ?  1:42 AM 03/30/2022  ?  4:05 AM  ?CBC  ?WBC 4.0 - 10.5 K/uL 17.3   14.5   19.4    ?Hemoglobin 13.0 - 17.0 g/dL 9.2   8.9   9.2    ?Hematocrit 39.0 - 52.0 % 29.9   28.8   29.7    ?Platelets 150 - 400 K/uL 332   335   344    ? ? ? ?  Latest Ref Rng & Units 04/01/2022  ? 10:57 AM 04/01/2022  ? 12:48 AM 03/31/2022  ?  1:42 AM  ?CMP  ?Glucose 70 - 99 mg/dL 132   112   76    ?BUN 8 - 23 mg/dL 38   42   45    ?Creatinine 0.61 - 1.24 mg/dL 3.01   2.81    3.03    ?Sodium 135 - 145 mmol/L 136   133   138    ?Potassium 3.5 - 5.1 mmol/L 3.0   2.9   3.3    ?Chloride 98 - 111 mmol/L 114   110   116    ?CO2 22 - 32 mmol/L 14   10   14     ?Calcium 8.9 - 10.3 mg/dL 8.0   7.9   8.4    ?Total Protein 6.5 - 8.1 g/dL   6.5    ?Total Bilirubin 0.3 - 1.2 mg/dL   1.0    ?Alkaline Phos 38 - 126 U/L   84    ?AST 15 - 41 U/L   15    ?ALT 0 - 44 U/L   19    ? ? ?Imaging: ?No results found. ? ?Assessment/Plan:  ? ?Principal Problem: ?  Acute renal failure superimposed on stage 3a chronic kidney disease ([place]) ?Active Problems: ?  Essential hypertension ?  Macrocytic anemia ?  Moderate protein-calorie malnutrition ([place]) ?  Metabolic acidosis, normal anion gap (NAG) ?  Biliary sludge ?  Gastroenteritis ? ? ?Patient Summary: ?Terry Barton is a 66 y.o. 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 ?Acute on chronic renal failure ?Renal function largely unchanged.  Potassium 3.0 this morning.  650 mill urine output recorded the nursing reports 2 large unmeasured voids.  Bladder scan was obtained and no concern for retention or stretch injury at this time.  If patient does retain we can consider Foley catheter.  Patient has received IV hydration and is taking p.o., however renal function remains impaired.  Likely has a component of ATN given his hypovolemia on admission.  We will treat supportively for now. ?- Furthermore given patient's low serum potassium and low serum bicarb this morning and hypochloremia with prior urine pH of 5.0 RTA type II is possibility.  If potassium bicarb continue to fall can consider urine alkalinization with sodium bicarb. ? ?Urinary Tract infection ?- Urine cultures showing greater than 100,000 colonies of E. coli and greater than 100,000 colonies of group B strep.  Patient has been receiving Zosyn which should cover  for strep.  3 days of Zosyn coverage thus far ?- Sensitivities indicate cefazolin sensitive.  We we will switch from Zosyn to cefazolin.  Likely 1-2 more days of antibiotics. ? ?Nausea vomiting food aversion ?Abnormal HIDA scan ?Patient's GI complaints have largely resolved.  Work-up thus far has been negative.  GI evaluated the patient and felt his symptoms were unrelated to any intra-abdominal etiology.  Patient has cholelithiasis with biliary sludge, likely to have chronic cholecystitis which cab be followed outpatient, but he is overall a poor surgical candidate due to comorbidities. ? ?Macrocytic anemia ?B12 normal.  We will supplement folate with malnutrition.  We will also add multivitamin. ? ?Severe non-anion gap metabolic acidosis ?Most likely secondary to his his acute on chronic renal failure and severe dehydration.  Hopeful that this will improve with continued LR infusion.  If no improvement can continue with oral bicarb tabs. ?-CMP daily ? ? ? ?Martrice Apt ?Internal Medicine Resident PGY-1 ?Pager [phone] ?Please contact the on call pager after 5 pm and on weekends at [phone]. ? ?

## 2022-04-02 LAB — BLOOD GAS, VENOUS
Acid-base deficit: 11.4 mmol/L — ABNORMAL HIGH (ref 0.0–2.0)
Bicarbonate: 14.2 mmol/L — ABNORMAL LOW (ref 20.0–28.0)
O2 Saturation: 78.9 %
Patient temperature: 36.8
pCO2, Ven: 31 mmHg — ABNORMAL LOW (ref 44–60)
pH, Ven: 7.27 (ref 7.25–7.43)
pO2, Ven: 43 mmHg (ref 32–45)

## 2022-04-02 LAB — POTASSIUM
Potassium: 2.8 mmol/L — ABNORMAL LOW (ref 3.5–5.1)
Potassium: 3.6 mmol/L (ref 3.5–5.1)

## 2022-04-02 LAB — BASIC METABOLIC PANEL
Anion gap: 9 (ref 5–15)
BUN: 38 mg/dL — ABNORMAL HIGH (ref 8–23)
CO2: 14 mmol/L — ABNORMAL LOW (ref 22–32)
Calcium: 7.9 mg/dL — ABNORMAL LOW (ref 8.9–10.3)
Chloride: 112 mmol/L — ABNORMAL HIGH (ref 98–111)
Creatinine, Ser: 2.9 mg/dL — ABNORMAL HIGH (ref 0.61–1.24)
GFR, Estimated: 23 mL/min — ABNORMAL LOW (ref >60)
Glucose, Bld: 133 mg/dL — ABNORMAL HIGH (ref 70–99)
Potassium: 2.8 mmol/L — ABNORMAL LOW (ref 3.5–5.1)
Sodium: 135 mmol/L (ref 135–145)

## 2022-04-02 LAB — CBC
HCT: 27.3 % — ABNORMAL LOW (ref 39.0–52.0)
Hemoglobin: 9 g/dL — ABNORMAL LOW (ref 13.0–17.0)
MCH: 32.1 pg (ref 26.0–34.0)
MCHC: 33 g/dL (ref 30.0–36.0)
MCV: 97.5 fL (ref 80.0–100.0)
Platelets: 317 10*3/uL (ref 150–400)
RBC: 2.8 MIL/uL — ABNORMAL LOW (ref 4.22–5.81)
RDW: 14.8 % (ref 11.5–15.5)
WBC: 17.8 10*3/uL — ABNORMAL HIGH (ref 4.0–10.5)
nRBC: 0.1 % (ref 0.0–0.2)

## 2022-04-02 MED ORDER — MELATONIN 3 MG PO TABS
3.0000 mg | ORAL_TABLET | Freq: Every day | ORAL | Status: DC
  Administered 2022-04-02 – 2022-04-03 (×2): 3 mg via ORAL
  Filled 2022-04-02 (×2): qty 1

## 2022-04-02 MED ORDER — POTASSIUM CHLORIDE 20 MEQ PO PACK
60.0000 meq | PACK | Freq: Two times a day (BID) | ORAL | Status: DC
  Administered 2022-04-02 (×2): 60 meq via ORAL
  Filled 2022-04-02 (×2): qty 3

## 2022-04-02 MED ORDER — SODIUM BICARBONATE 650 MG PO TABS
650.0000 mg | ORAL_TABLET | Freq: Two times a day (BID) | ORAL | Status: DC
  Administered 2022-04-02 – 2022-04-03 (×2): 650 mg via ORAL
  Filled 2022-04-02 (×2): qty 1

## 2022-04-02 NOTE — Care Management Important Message (Signed)
Important Message ? ?Patient Details  ?Name: Terry Barton ?MRN: [id number] ?Date of Birth: [birth year] ? ? ?Medicare Important Message Given:  Yes ? ? ? ? ?Clarine Elrod ?04/02/2022, 2:11 PM ?

## 2022-04-02 NOTE — Progress Notes (Signed)
? ? ?HD#3 ?Subjective:  ?Overnight Events: Admitted overnight ? ?Subjective: Seen and evaluated bedside.  States he is doing well.  Denies any cough, nausea vomiting diarrhea, no urinary symptoms.  He has been urinating well.  Denies pain.  Does not complain of wounds. ? ?Addendum: Reevaluate patient at bedside. He did not complain of pain. Inspected sacral wound.  Wound did not appear to be infected, no edema erythema induration, no active drainage.  Patient had had episode of diarrhea.  Dark brown in color, no obvious blood noted. ?  ? ?Objective:  ?Vital signs in last 24 hours: ?Vitals:  ? 04/02/22 0000 04/02/22 0300 04/02/22 0747 04/02/22 1159  ?BP: 118/75 136/68 126/81 (!) 148/69  ?Pulse: 67 68 64 84  ?Resp: 17 19 17 14   ?Temp: (!) 97.3 ?F (36.3 ?C)  (!) 97.4 ?F (36.3 ?C) (!) 97.5 ?F (36.4 ?C)  ?TempSrc: Oral  Oral Oral  ?SpO2: 99% 98% 98% 98%  ?Weight:      ?Height:      ? ?Supplemental O2: Room Air ?SpO2: 98 % ? ?Physical Exam:  ?Constitutional: Elderly appearing ?HENT: normocephalic atraumatic, temporal wasting, edentulous, dentures in place with ?Eyes: conjunctiva non-erythematous ?Neck: supple ?Cardiovascular: regular rate and rhythm, no m/r/g ?Pulmonary/Chest: normal work of breathing on room air, lungs clear to auscultation bilaterally ?Abdominal: Soft, nontender, nondistended.  Tympanic to percussion. ?MSK: Sarcopenia, sacral decubitus ulcer.  No edema, erythema, induration.  No drainage.  Does not appear to be infected currently. ?Neurological: alert & oriented x 3 ?Skin: warm and dry ?Psych: Normal mood and thought process ? ? ?Intake/Output Summary (Last 24 hours) at 04/02/2022 1502 ?Last data filed at 04/02/2022 1444 ?Gross per 24 hour  ?Intake 340 ml  ?Output 1000 ml  ?Net -660 ml  ? ? ?Net IO Since Admission: -1,670 mL [04/02/22 1502] ? ?Pertinent Labs: ? ?  Latest Ref Rng & Units 04/02/2022  ?  1:41 AM 04/01/2022  ? 12:48 AM 03/31/2022  ?  1:42 AM  ?CBC  ?WBC 4.0 - 10.5 K/uL 17.8   17.3   14.5     ?Hemoglobin 13.0 - 17.0 g/dL 9.0   9.2   8.9    ?Hematocrit 39.0 - 52.0 % 27.3   29.9   28.8    ?Platelets 150 - 400 K/uL 317   332   335    ? ? ? ?  Latest Ref Rng & Units 04/02/2022  ? 10:14 AM 04/02/2022  ?  1:41 AM 04/01/2022  ? 10:57 AM  ?CMP  ?Glucose 70 - 99 mg/dL  133   132    ?BUN 8 - 23 mg/dL  38   38    ?Creatinine 0.61 - 1.24 mg/dL  2.90   3.01    ?Sodium 135 - 145 mmol/L  135   136    ?Potassium 3.5 - 5.1 mmol/L 2.8   2.8   3.0    ?Chloride 98 - 111 mmol/L  112   114    ?CO2 22 - 32 mmol/L  14   14    ?Calcium 8.9 - 10.3 mg/dL  7.9   8.0    ? ? ?Imaging: ?No results found. ? ?Assessment/Plan:  ? ?Principal Problem: ?  Acute renal failure superimposed on stage 3a chronic kidney disease ([place]) ?Active Problems: ?  Essential hypertension ?  Macrocytic anemia ?  Moderate protein-calorie malnutrition ([place]) ?  Metabolic acidosis, normal anion gap (NAG) ?  Biliary sludge ?  Gastroenteritis ? ? ?Patient  Summary: ?Terry Barton is a 66 y.o. 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 ?Acute on chronic renal failure ?Metabolic acidosis ?Renal function largely unchanged.  800cc urine.  Likely has a component of ATN given his hypovolemia on admission.  He is tolerating p.o., and we will encourage oral hydration.  We will treat supportively for now. ?- Furthermore given patient's low serum potassium and low serum bicarb this morning and hypochloremia with prior urine pH of 5.0 RTA type II is possibility. Urine sodium, chloride, potassium have been ordered.  We will follow these up.  ?- Given patient's degree of metabolic acidosis, we will begin sodium bicarb supplementation 650 BID.   ? ?Urinary Tract infection ?- Urine cultures showing greater than 100,000 colonies of E. coli and greater than 100,000 colonies of group B strep.  Patient has been receiving Zosyn which  should cover for strep.  3 days of Zosyn coverage thus far ?- Sensitivities indicate cefazolin sensitive.  We will continue on cefazolin for 1 more day. ? ?Leukocytosis ?She noted to have elevated white blood cell count 17.  This is stayed stable.  He has not had any fevers, and denies any cough, nausea, vomiting, diarrhea, urinary symptoms.  He does have a sacral ulcer, however on evaluation of wound today, no edema, erythema, induration or active drainage noted.  Does not appear to be infected at this time.  During exam of ulcer patient was noted to have episode of dark brown diarrhea.  Per nursing he has not been having frequent episodes of diarrhea and this was not one of his complaints upon admission either.  We will continue to monitor but do not believe that this is the source of his elevated leukocytosis at this time. ? ?Nausea vomiting food aversion ?Abnormal HIDA scan ?Patient's GI complaints have largely resolved.  Work-up thus far has been negative.  GI evaluated the patient and felt his symptoms were unrelated to any intra-abdominal etiology.  Patient has cholelithiasis with biliary sludge, likely to have chronic cholecystitis which cab be followed outpatient, but he is overall a poor surgical candidate due to comorbidities. ? ?Macrocytic anemia ?B12 normal.  We will supplement folate with malnutrition.  We will also add multivitamin. ? ?Delene Ruffini ?Internal Medicine Resident PGY-1 ?Pager [phone] ?Please contact the on call pager after 5 pm and on weekends at [phone]. ? ?

## 2022-04-03 LAB — CBC
HCT: 27.8 % — ABNORMAL LOW (ref 39.0–52.0)
Hemoglobin: 8.7 g/dL — ABNORMAL LOW (ref 13.0–17.0)
MCH: 31 pg (ref 26.0–34.0)
MCHC: 31.3 g/dL (ref 30.0–36.0)
MCV: 98.9 fL (ref 80.0–100.0)
Platelets: 324 10*3/uL (ref 150–400)
RBC: 2.81 MIL/uL — ABNORMAL LOW (ref 4.22–5.81)
RDW: 15 % (ref 11.5–15.5)
WBC: 15.3 10*3/uL — ABNORMAL HIGH (ref 4.0–10.5)
nRBC: 0.1 % (ref 0.0–0.2)

## 2022-04-03 LAB — BASIC METABOLIC PANEL
Anion gap: 6 (ref 5–15)
BUN: 38 mg/dL — ABNORMAL HIGH (ref 8–23)
CO2: 14 mmol/L — ABNORMAL LOW (ref 22–32)
Calcium: 7.9 mg/dL — ABNORMAL LOW (ref 8.9–10.3)
Chloride: 117 mmol/L — ABNORMAL HIGH (ref 98–111)
Creatinine, Ser: 3.02 mg/dL — ABNORMAL HIGH (ref 0.61–1.24)
GFR, Estimated: 22 mL/min — ABNORMAL LOW (ref >60)
Glucose, Bld: 144 mg/dL — ABNORMAL HIGH (ref 70–99)
Potassium: 4.2 mmol/L (ref 3.5–5.1)
Sodium: 137 mmol/L (ref 135–145)

## 2022-04-03 LAB — MAGNESIUM
Magnesium: 1 mg/dL — ABNORMAL LOW (ref 1.7–2.4)
Magnesium: 1.6 mg/dL — ABNORMAL LOW (ref 1.7–2.4)

## 2022-04-03 LAB — PHOSPHORUS
Phosphorus: 1.3 mg/dL — ABNORMAL LOW (ref 2.5–4.6)
Phosphorus: 2.6 mg/dL (ref 2.5–4.6)

## 2022-04-03 MED ORDER — SODIUM PHOSPHATES 45 MMOLE/15ML IV SOLN
40.0000 mmol | Freq: Once | INTRAVENOUS | Status: AC
  Administered 2022-04-03: 40 mmol via INTRAVENOUS
  Filled 2022-04-03: qty 13.33

## 2022-04-03 MED ORDER — SODIUM BICARBONATE 650 MG PO TABS
650.0000 mg | ORAL_TABLET | Freq: Three times a day (TID) | ORAL | Status: DC
  Administered 2022-04-03 – 2022-04-04 (×4): 650 mg via ORAL
  Filled 2022-04-03 (×4): qty 1

## 2022-04-03 MED ORDER — PROSOURCE PLUS PO LIQD
30.0000 mL | Freq: Two times a day (BID) | ORAL | Status: DC
  Administered 2022-04-03 – 2022-04-04 (×2): 30 mL via ORAL
  Filled 2022-04-03 (×2): qty 30

## 2022-04-03 MED ORDER — MAGNESIUM SULFATE 2 GM/50ML IV SOLN
2.0000 g | Freq: Once | INTRAVENOUS | Status: AC
  Administered 2022-04-03: 2 g via INTRAVENOUS
  Filled 2022-04-03: qty 50

## 2022-04-03 MED ORDER — SODIUM BICARBONATE 650 MG PO TABS
1300.0000 mg | ORAL_TABLET | Freq: Three times a day (TID) | ORAL | Status: DC
Start: 2022-04-03 — End: 2022-04-03

## 2022-04-03 MED ORDER — MAGNESIUM SULFATE IN D5W 1-5 GM/100ML-% IV SOLN
1.0000 g | Freq: Once | INTRAVENOUS | Status: DC
  Filled 2022-04-03: qty 100

## 2022-04-03 MED ORDER — GERHARDT'S BUTT CREAM
TOPICAL_CREAM | Freq: Two times a day (BID) | CUTANEOUS | Status: DC
  Filled 2022-04-03 (×2): qty 1

## 2022-04-03 NOTE — Progress Notes (Addendum)
? ? ?HD#4 ?Subjective:  ? ?Subjective: Seen and evaluated bedside.  States he is doing well.  Denies any cough, nausea vomiting diarrhea, no urinary symptoms.  He has been urinating well.  Denies pain.  Does not complain of wounds. ? ?Addendum: Reevaluate patient at bedside. He did not complain of pain. Inspected sacral wound.  Wound did not appear to be infected, no erythema or induration, no active drainage.  Patient had episode of diarrhea.  Dark brown in color, no obvious blood noted. ?  ? ?Objective:  ?Vital signs in last 24 hours: ?Vitals:  ? 04/02/22 2320 04/03/22 0400 04/03/22 0741 04/03/22 1246  ?BP:  123/68 (!) 108/50 126/71  ?Pulse: 91 72 72 72  ?Resp: 16 17 15 19   ?Temp:  97.9 ?F (36.6 ?C) 98 ?F (36.7 ?C) 98.1 ?F (36.7 ?C)  ?TempSrc:  Oral Axillary Axillary  ?SpO2: 98% 99% 98% 98%  ?Weight:      ?Height:      ? ?Supplemental O2: Room Air ?SpO2: 98 % ? ?Physical Exam:  ?Constitutional: Elderly appearing ?HENT: normocephalic atraumatic, temporal wasting, edentulous, dentures in place with ?Eyes: conjunctiva non-erythematous ?Neck: supple ?Cardiovascular: regular rate and rhythm, no m/r/g ?Pulmonary/Chest: normal work of breathing on room air, lungs clear to auscultation bilaterally ?Abdominal: Soft, nontender, nondistended.  Tympanic to percussion. ?MSK: Sarcopenia, sacral decubitus ulcer.  No edema, erythema, induration.  No drainage.  Does not appear to be infected currently. ?Neurological: alert & oriented x 3 ?Skin: warm and dry ?Psych: Normal mood and thought process ? ? ?Intake/Output Summary (Last 24 hours) at 04/03/2022 1402 ?Last data filed at 04/03/2022 0700 ?Gross per 24 hour  ?Intake 820.16 ml  ?Output 1575 ml  ?Net -754.84 ml  ? ? ?Net IO Since Admission: -1,674.84 mL [04/03/22 1402] ? ?Pertinent Labs: ? ?  Latest Ref Rng & Units 04/03/2022  ? 12:43 AM 04/02/2022  ?  1:41 AM 04/01/2022  ? 12:48 AM  ?CBC  ?WBC 4.0 - 10.5 K/uL 15.3   17.8   17.3    ?Hemoglobin 13.0 - 17.0 g/dL 8.7   9.0   9.2     ?Hematocrit 39.0 - 52.0 % 27.8   27.3   29.9    ?Platelets 150 - 400 K/uL 324   317   332    ? ? ? ?  Latest Ref Rng & Units 04/03/2022  ? 12:43 AM 04/02/2022  ?  4:34 PM 04/02/2022  ? 10:14 AM  ?CMP  ?Glucose 70 - 99 mg/dL 144      ?BUN 8 - 23 mg/dL 38      ?Creatinine 0.61 - 1.24 mg/dL 3.02      ?Sodium 135 - 145 mmol/L 137      ?Potassium 3.5 - 5.1 mmol/L 4.2   3.6   2.8    ?Chloride 98 - 111 mmol/L 117      ?CO2 22 - 32 mmol/L 14      ?Calcium 8.9 - 10.3 mg/dL 7.9      ? ? ?Imaging: ?No results found. ? ?Assessment/Plan:  ? ?Principal Problem: ?  Acute renal failure superimposed on stage 3a chronic kidney disease ([place]) ?Active Problems: ?  Essential hypertension ?  Macrocytic anemia ?  Moderate protein-calorie malnutrition ([place]) ?  Metabolic acidosis, normal anion gap (NAG) ?  Biliary sludge ?  Gastroenteritis ? ? ?Patient Summary: ?Terry Barton is a 66 y.o. 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 ?Acute on chronic renal failure ?Metabolic acidosis ?Renal function largely unchanged.  800cc urine.  Likely has a component of ATN given his hypovolemia on admission.  He is tolerating p.o., and we will encourage oral hydration.  We will treat supportively for now. ?- Non-anion gap metabolic acidosis may represent combination CKD or CKD associated RTA.  Urine sodium, chloride, potassium have been ordered but not yet collected.  We will follow these up.  ?-Patient has been started on sodium bicarb, but we will increase this to a total of 650 mg sodium bicarb 3 times daily. ?- Patient will need to establish with nephrology once discharged. ? ?Urinary Tract infection ?- Urine cultures showing greater than 100,000 colonies of E. coli and greater than 100,000 colonies of group B strep.  Sensitive to cefazolin ?-Patient currently receiving cefazolin, will plan for total of 7  days antibiotics.  Day 6 of 7. ? ?Leukocytosis ?She noted to have elevated white blood cell count 17.  This is stayed stable.  He has not had any fevers, and denies any cough, nausea, vomiting, diarrhea, urinary symptoms.  Sacral wound does not appear to be source of infection at this time.  He did have episode of blood-streaked bowel movement overnight.  However this has been self-limited and do not believe this is related to his leukocytosis.  Fortunately leukocytosis has shown mild improvement to 15 today.  We will continue to monitor for obvious signs of infection. ? ?Nausea vomiting food aversion ?Abnormal HIDA scan ?Patient's GI complaints have largely resolved.  Work-up thus far has been negative.  GI evaluated the patient and felt his symptoms were unrelated to any intra-abdominal etiology.  Patient has cholelithiasis with biliary sludge, likely to have chronic cholecystitis which cab be followed outpatient, but he is overall a poor surgical candidate due to comorbidities. ? ?Macrocytic anemia ?B12 normal.  We will supplement folate with malnutrition.  We will also add multivitamin. ? ?Cervical spine injury ?History cerebral infarction ?Sacral decubitus ulcer ?History of cervical spinal injury leading patient bedbound with minimal mobility, requiring 2 people to turn in bed.  Sacral decubitus pressure ulcer without evidence of obvious infection at this time.  Wound care has been consulted recommending cleaning area with normal saline and patting dry, Butt paste, and repositioning every 2 hours.  Also recommending mattress with low-air-loss feature in Prevalon boots. ?Per son, patient has hospital bed at home as well as hoist. ?-Patient also has difficulty feeding himself.  And there is concern for malnutrition.  We will asked nursing to monitor meal percentage consumption. ? ?Confusion ?Patient has risk factors for delirium including cerebral infarction.  He was somewhat confused on my exam today.   Discontinued telemetry and ordered delirium precautions. ?- We will continue to monitor. ? ?Delene Ruffini ?Internal Medicine Resident PGY-1 ?Pager [phone] ?Please contact the on call pager after 5 pm and on weekends at [phone]. ? ?

## 2022-04-03 NOTE — Progress Notes (Signed)
Nutrition Follow-up ? ?DOCUMENTATION CODES:  ? ?Not applicable ? ?INTERVENTION:  ? ?Continue Multivitamin w/ minerals daily ?Continue Ensure Enlive po TID, each supplement provides 350 kcal and 20 grams of protein. ?30 ml ProSource Plus BID, each supplement provides 100 kcals and 15 grams protein.  ?Meal ordering with assist ?Encourage good PO intake ?Feeding assist with all meals ? ?NUTRITION DIAGNOSIS:  ? ?Increased nutrient needs related to wound healing as evidenced by estimated needs. - Ongoing ? ?GOAL:  ? ?Patient will meet greater than or equal to 90% of their needs - Ongoing ? ?MONITOR:  ? ?PO intake, Supplement acceptance, Labs, Skin, Weight trends ? ?REASON FOR ASSESSMENT:  ? ?Consult ?Assessment of nutrition requirement/status ? ?ASSESSMENT:  ? ?66 y.o. male presented to the ED with abdominal pain, nausea and vomiting. PMH includes HTN, ICH, CKD IIIb, T2DM, and a spinal cord injury. Pt admitted with gastroenteritis, bacteruria, and AKI on CKD.  ? ?RD received a consult for assessment of nutrition requirements/status.  ? ?RD attempted to see pt x2.  ? ?Spoke with NT outside of room, reports that all pt wanted for lunch was an Ensure.  ?No meal intakes have been recorded within EMR.  ? ?Per Meds history, pt has accepted 78% of the Ensure's offered.  ? ?Medications reviewed and include: MVI, Protonix ?Labs reviewed: BUN 38, Creatinine 3.02, Magnesium 1.6  ? ?Diet Order:   ?Diet Order   ? ?       ?  Diet regular Room service appropriate? Yes; Fluid consistency: Thin  Diet effective now       ?  ? ?  ?  ? ?  ? ? ?EDUCATION NEEDS:  ? ?No education needs have been identified at this time ? ?Skin:  Skin Assessment: Skin Integrity Issues: ?Skin Integrity Issues:: Unstageable ?Unstageable: Sacrum ? ?Last BM:  4/27 ? ?Height:  ? ?Ht Readings from Last 1 Encounters:  ?04/01/22 6' (1.829 m)  ? ? ?Weight:  ? ?Wt Readings from Last 1 Encounters:  ?04/01/22 94.4 kg  ? ? ?Ideal Body Weight:  80.9 kg ? ?BMI:  Body mass  index is 28.23 kg/m?. ? ?Estimated Nutritional Needs:  ? ?Kcal:  2000-2200 ? ?Protein:  100-115 grams ? ?Fluid:  >/= 2 L ? ? ? ?Kirby Crigler RD, LDN ?Clinical Dietitian ?See AMiON for contact information.  ? ?

## 2022-04-03 NOTE — TOC Initial Note (Addendum)
Transition of Care (TOC) - Initial/Assessment Note  ? ? ?Patient Details  ?Name: Terry Barton ?MRN: [id number] ?Date of Birth: 31-Aug-1956 ? ?Transition of Care (TOC) CM/SW Contact:    ?Cyndi Bender, RN ?Phone Number: ?04/03/2022, 12:38 PM ? ?Clinical Narrative:                 ?Spoke to son, Terry Barton  regarding transition needs. Patient lives with son. Patient has all needed DME: hoyer lift, hospital bed, motorized wheelchair. Spoke to [place] with [employer] and they don't have a hospital bed that can go in the Trendelenburg position. Son has a Web designer but it isn't available at this time. Patient will need PTAR transportation home once discharged.  ?Patient remains inpatient due to needing IV potassium and phosphorus ?TOC to continue to follow for needs ? ? ?Expected Discharge Plan: Home/Self Care ?Barriers to Discharge: Continued Medical Work up ? ? ?Patient Goals and CMS Choice ?  ?  ?  ? ?Expected Discharge Plan and Services ?Expected Discharge Plan: Home/Self Care ?  ?  ?  ?Living arrangements for the past 2 months: [place] ?                ?  ?  ?  ?  ?  ?  ?  ?  ?  ?  ? ?Prior Living Arrangements/Services ?Living arrangements for the past 2 months: [place] ?Lives with:: Adult Children ?Patient language and need for interpreter reviewed:: Yes ?       ?Need for Family Participation in Patient Care: Yes (Comment) ?Care giver support system in place?: Yes (comment) ?  ?Criminal Activity/Legal Involvement Pertinent to Current Situation/Hospitalization: No - Comment as needed ? ?Activities of Daily Living ?Home Assistive Devices/Equipment: Wheelchair, Dentures (specify type), [employer] ?ADL Screening (condition at time of admission) ?Patient's cognitive ability adequate to safely complete daily activities?: Yes ?Is the patient deaf or have difficulty hearing?: No ?Does the patient have difficulty seeing, even when wearing glasses/contacts?: No ?Does the patient have difficulty  concentrating, remembering, or making decisions?: No ?Patient able to express need for assistance with ADLs?: Yes ?Does the patient have difficulty dressing or bathing?: Yes ?Independently performs ADLs?: No ?Communication: Independent ?Dressing (OT): Dependent ?Is this a change from baseline?: Pre-admission baseline ?Grooming: Dependent ?Is this a change from baseline?: Pre-admission baseline ?Feeding: Dependent ?Is this a change from baseline?: Pre-admission baseline ?Bathing: Dependent ?Is this a change from baseline?: Pre-admission baseline ?Toileting: Dependent ?Is this a change from baseline?: Pre-admission baseline ?In/Out Bed: Dependent ?Is this a change from baseline?: Pre-admission baseline ?Walks in Home: Dependent ?Is this a change from baseline?: Pre-admission baseline ?Does the patient have difficulty walking or climbing stairs?: Yes ?Weakness of Legs: Both ?Weakness of Arms/Hands: Both ? ?Permission Sought/Granted ?Permission sought to share information with : Case Manager ?  ?   ?   ?   ?   ? ?Emotional Assessment ?  ?  ?  ?Orientation: : Oriented to Self ?Alcohol / Substance Use: Not Applicable ?Psych Involvement: No (comment) ? ?Admission diagnosis:  Abdominal pain, generalized [R10.84] ?Gastroenteritis [K52.9] ?Gallstones [K80.20] ?Kidney lesion [N28.9] ?Pleural effusion, right [J90] ?Patient Active Problem List  ? Diagnosis Date Noted  ? Macrocytic anemia 03/30/2022  ? Moderate protein-calorie malnutrition ([place]) 03/30/2022  ? Metabolic acidosis, normal anion gap (NAG) 03/30/2022  ? Biliary sludge 03/30/2022  ? Gastroenteritis 03/30/2022  ? Essential hypertension 05/17/2020  ? Hyperlipidemia 05/17/2020  ? Acute renal failure superimposed on stage 3a  chronic kidney disease ([place]) 05/17/2020  ? Hypokalemia 05/17/2020  ? 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 Diabetes mellitus ([place]) 10/18/2019  ? ?PCP:  Arthur Holms, NP ?Pharmacy:   ?[id number] - [place], [address] ?[address] ?[place] [address] ?Phone: [phone] Fax: [phone] ? ? ? ? ?Social Determinants of Health (SDOH) Interventions ?  ? ?Readmission Risk Interventions ?   ? View : No data to display.  ?  ?  ?  ? ? ? ?

## 2022-04-03 NOTE — Consult Note (Signed)
WOC Nurse Consult Note: ?Reason for Consult:Asked to consult for sacrococcygeal wound that is noted on admission.  Scarring to upper gluteal folds evidence of previous skin injury. Patient with minimal bed mobility and required 2 person assist to turn in bed.  He has had some episodes of loose stooling while here.  UTI present on admission with E coli bacteria identified.  ?Wound type:Pressure and moisture ?Pressure Injury POA: Yes ?Measurement: coccyx: 0.2 cm nonintact lesion with maceration to periwound, extends an additional 0.5 cm .  Upper gluteal/sacral area with intact scarred areas from previous ulceration ?Wound bed: cannot visualize due to small diameter of open wound ?Drainage (amount, consistency, odor) none noted  ?Periwound:scarring, see above. ?Dressing procedure/placement/frequency: Cleanse sacrococcygeal area with NS and pat dry.  Gerhardts butt paste to sacral area.  Turn and reposition every two hours.  Mattress with low air loss feature.  ?Prevalon boots ? ?Will not follow at this time.  Please re-consult if needed.  ?[place]le [place] MSN, RN, FNP-BC CWON ?Wound, Ostomy, Continence Nurse ?Pager [phone]  ? ? ?  ?

## 2022-04-03 NOTE — Progress Notes (Signed)
Physical Therapy Treatment ?Patient Details ?Name: Terry Barton ?MRN: [id number] ?DOB: 11-03-1956 ?Today's Date: 04/03/2022 ? ? ?History of Present Illness 66 yo male presenting to ED on 4/22 with nausea and vomiting. Amitted for acute on chronic renal failure. PMH including cervical spine injury, CVA with residual right-sided deficits, HTN, hypothyroidism, T2DM, CKD IIIb, and bowel/bladder incontinence. ? ?  ?PT Comments  ? ? Patient received supine, turned to right side. Removed pillow from left hip and provided ROM LUE and bil LEs (see below for details). Educated pt on need to work on rt hip internal rotation/positioning and donned prevalon boot at end of session for positioning and heel protection. Left prevalon also donned at end of session with education re: proper use and function of boots.  ?  ?Recommendations for follow up therapy are one component of a multi-disciplinary discharge planning process, led by the attending physician.  Recommendations may be updated based on patient status, additional functional criteria and insurance authorization. ? ?Follow Up Recommendations ? No PT follow up ?  ?  ?Assistance Recommended at Discharge Frequent or constant Supervision/Assistance  ?Patient can return home with the following Two people to help with walking and/or transfers;Two people to help with bathing/dressing/bathroom;Assistance with cooking/housework;Assistance with feeding;Direct supervision/assist for medications management;Direct supervision/assist for financial management;Assist for transportation;Help with stairs or ramp for entrance ?  ?Equipment Recommendations ? Hospital bed (updated hospital bed with trendelenberg)  ?  ?Recommendations for Other Services   ? ? ?  ?Precautions / Restrictions Precautions ?Precautions: Fall ?Restrictions ?Weight Bearing Restrictions: No  ?  ? ?Mobility ? Bed Mobility ?  ?  ?  ?  ?  ?  ?  ?  ?  ? ?Transfers ?  ?  ?  ?  ?  ?  ?  ?  ?  ?  ?  ? ?Ambulation/Gait ?  ?  ?  ?   ?  ?  ?  ?  ? ? ?Stairs ?  ?  ?  ?  ?  ? ? ?Wheelchair Mobility ?  ? ?Modified Rankin (Stroke Patients Only) ?  ? ? ?  ?Balance   ?  ?  ?  ?  ?  ?  ?  ?  ?  ?  ?  ?  ?  ?  ?  ?  ?  ?  ?  ? ?  ?Cognition Arousal/Alertness: Awake/alert ?Behavior During Therapy: [facility] for tasks assessed/performed ?Overall Cognitive Status: No family/caregiver present to determine baseline cognitive functioning ?  ?  ?  ?  ?  ?  ?  ?  ?  ?  ?  ?  ?Following Commands: Follows one step commands consistently ?  ?  ?  ?General Comments: no incr time for ROM commands with LUE and bil LE ?  ?  ? ?  ?Exercises Other Exercises ?Other Exercises: LUE AAROM shoulder flexion, resisted shoulder extension x 5 reps ?Other Exercises: left grip and release x 5 reps ?Other Exercises: PROM RLE (hip flex, hip IR, knee flexion, ankle DF) x 5reps each; prolonged hold for hip IR x 3 ?Other Exercises: AAROM LLE (hip flexion, knee flexion, ankle PF/DF) x 5 reps each with prolonged hold after last rep ? ?  ?General Comments General comments (skin integrity, edema, etc.): bil Prevalon boots delivered and donned at end of session. Educated pt on need to continue Rt hip internal rotation stretching for better positioning in his power chair ?  ?  ? ?Pertinent Vitals/Pain Pain  Assessment ?Pain Assessment: No/denies pain  ? ? ?Home Living Family/patient expects to be discharged to:: Private residence ?Living Arrangements: Children ?Available Help at Discharge: Family;Available 24 hours/day ?Type of Home: House ?Home Access: Ramped entrance (portable) ?  ?  ?  ?Home Layout: Able to live on main level with bedroom/bathroom ?Home Equipment: Wheelchair - manual;Wheelchair - power;Other (comment);Hospital bed (hoyer) ?Additional Comments: lives with son and family  ?  ?Prior Function    ?  ?  ?   ? ?PT Goals (current goals can now be found in the care plan section) Acute Rehab PT Goals ?Patient Stated Goal: go fishing ?Time For Goal Achievement: 04/14/22 ?Potential to  Achieve Goals: Fair ?Progress towards PT goals: Progressing toward goals ? ?  ?Frequency ? ? ? Min 2X/week ? ? ? ?  ?PT Plan Current plan remains appropriate  ? ? ?Co-evaluation   ?  ?  ?  ?  ? ?  ?AM-PAC PT "6 Clicks" Mobility   ?Outcome Measure ? Help needed turning from your back to your side while in a flat bed without using bedrails?: Total ?Help needed moving from lying on your back to sitting on the side of a flat bed without using bedrails?: Total ?Help needed moving to and from a bed to a chair (including a wheelchair)?: Total ?Help needed standing up from a chair using your arms (e.g., wheelchair or bedside chair)?: Total ?Help needed to walk in hospital room?: Total ?Help needed climbing 3-5 steps with a railing? : Total ?6 Click Score: 6 ? ?  ?End of Session Equipment Utilized During Treatment: Other (comment) (donned bil prevalon boots) ?Activity Tolerance: Patient tolerated treatment well ?Patient left: in bed;with call bell/phone within reach;with bed alarm set ?Nurse Communication:  (boots donned) ?PT Visit Diagnosis: Muscle weakness (generalized) (M62.81);Other abnormalities of gait and mobility (R26.89) ?  ? ? ?Time: 8366-2947 ?PT Time Calculation (min) (ACUTE ONLY): 17 min ? ?Charges:  $Therapeutic Exercise: 8-22 mins          ?          ? ? ?Terry Barton, PT ?Acute Rehabilitation Services  ?Pager [phone] ?Office [phone] ? ? ? ?Terry Barton ?04/03/2022, 11:20 AM ? ?

## 2022-04-04 ENCOUNTER — Other Ambulatory Visit (HOSPITAL_COMMUNITY): Payer: Self-pay

## 2022-04-04 LAB — CBC
HCT: 26.9 % — ABNORMAL LOW (ref 39.0–52.0)
Hemoglobin: 8.4 g/dL — ABNORMAL LOW (ref 13.0–17.0)
MCH: 30.4 pg (ref 26.0–34.0)
MCHC: 31.2 g/dL (ref 30.0–36.0)
MCV: 97.5 fL (ref 80.0–100.0)
Platelets: 349 10*3/uL (ref 150–400)
RBC: 2.76 MIL/uL — ABNORMAL LOW (ref 4.22–5.81)
RDW: 15.1 % (ref 11.5–15.5)
WBC: 16.8 10*3/uL — ABNORMAL HIGH (ref 4.0–10.5)
nRBC: 0.1 % (ref 0.0–0.2)

## 2022-04-04 LAB — CBC WITH DIFFERENTIAL/PLATELET
Abs Immature Granulocytes: 0.62 10*3/uL — ABNORMAL HIGH (ref 0.00–0.07)
Basophils Absolute: 0.1 10*3/uL (ref 0.0–0.1)
Basophils Relative: 0 %
Eosinophils Absolute: 0.4 10*3/uL (ref 0.0–0.5)
Eosinophils Relative: 3 %
HCT: 28.4 % — ABNORMAL LOW (ref 39.0–52.0)
Hemoglobin: 9.2 g/dL — ABNORMAL LOW (ref 13.0–17.0)
Immature Granulocytes: 4 %
Lymphocytes Relative: 16 %
Lymphs Abs: 2.3 10*3/uL (ref 0.7–4.0)
MCH: 31.4 pg (ref 26.0–34.0)
MCHC: 32.4 g/dL (ref 30.0–36.0)
MCV: 96.9 fL (ref 80.0–100.0)
Monocytes Absolute: 1.1 10*3/uL — ABNORMAL HIGH (ref 0.1–1.0)
Monocytes Relative: 8 %
Neutro Abs: 9.8 10*3/uL — ABNORMAL HIGH (ref 1.7–7.7)
Neutrophils Relative %: 69 %
Platelets: 328 10*3/uL (ref 150–400)
RBC: 2.93 MIL/uL — ABNORMAL LOW (ref 4.22–5.81)
RDW: 15.1 % (ref 11.5–15.5)
WBC: 14.2 10*3/uL — ABNORMAL HIGH (ref 4.0–10.5)
nRBC: 0 % (ref 0.0–0.2)

## 2022-04-04 LAB — CHLORIDE, URINE, RANDOM: Chloride Urine: 73 mmol/L

## 2022-04-04 LAB — PHOSPHORUS: Phosphorus: 3.5 mg/dL (ref 2.5–4.6)

## 2022-04-04 LAB — BASIC METABOLIC PANEL
Anion gap: 8 (ref 5–15)
BUN: 37 mg/dL — ABNORMAL HIGH (ref 8–23)
CO2: 13 mmol/L — ABNORMAL LOW (ref 22–32)
Calcium: 7.9 mg/dL — ABNORMAL LOW (ref 8.9–10.3)
Chloride: 115 mmol/L — ABNORMAL HIGH (ref 98–111)
Creatinine, Ser: 2.98 mg/dL — ABNORMAL HIGH (ref 0.61–1.24)
GFR, Estimated: 22 mL/min — ABNORMAL LOW (ref >60)
Glucose, Bld: 119 mg/dL — ABNORMAL HIGH (ref 70–99)
Potassium: 3.7 mmol/L (ref 3.5–5.1)
Sodium: 136 mmol/L (ref 135–145)

## 2022-04-04 LAB — CULTURE, BLOOD (ROUTINE X 2)
Culture: NO GROWTH
Culture: NO GROWTH

## 2022-04-04 LAB — NA AND K (SODIUM & POTASSIUM), RAND UR
Potassium Urine: 19 mmol/L
Sodium, Ur: 62 mmol/L

## 2022-04-04 LAB — MAGNESIUM: Magnesium: 1.4 mg/dL — ABNORMAL LOW (ref 1.7–2.4)

## 2022-04-04 MED ORDER — ENOXAPARIN SODIUM 30 MG/0.3ML IJ SOSY
30.0000 mg | PREFILLED_SYRINGE | INTRAMUSCULAR | Status: DC
Start: 2022-04-04 — End: 2022-04-05
  Administered 2022-04-04: 30 mg via SUBCUTANEOUS
  Filled 2022-04-04: qty 0.3

## 2022-04-04 MED ORDER — SODIUM BICARBONATE 650 MG PO TABS
650.0000 mg | ORAL_TABLET | Freq: Three times a day (TID) | ORAL | 0 refills | Status: AC
Start: 2022-04-04 — End: 2022-05-04
  Filled 2022-04-04: qty 90, 30d supply, fill #0

## 2022-04-04 MED ORDER — GERHARDT'S BUTT CREAM
1.0000 "application " | TOPICAL_CREAM | Freq: Two times a day (BID) | CUTANEOUS | 0 refills | Status: AC
  Filled 2022-04-04: qty 60, 30d supply, fill #0

## 2022-04-04 MED ORDER — CERTAVITE/ANTIOXIDANTS PO TABS
1.0000 | ORAL_TABLET | Freq: Every day | ORAL | 0 refills | Status: AC
  Filled 2022-04-04: qty 30, 30d supply, fill #0

## 2022-04-04 NOTE — Discharge Summary (Addendum)
? ?Name: Terry Barton ?MRN: [id number] ?DOB: 01-Feb-1956 66 y.o. ?PCP: Arthur Holms, NP ? ?Date of Admission: 03/29/2022  6:34 PM ?Date of Discharge: 04/04/2022 ?Attending Physician: Dr. Evette Doffing ? ?Discharge Diagnosis: ?Principal Problem: ?  Acute renal failure superimposed on stage 3a chronic kidney disease ([place]) ?Active Problems: ?  Essential hypertension ?  Macrocytic anemia ?  Moderate protein-calorie malnutrition ([place]) ?  Metabolic acidosis, normal anion gap (NAG) ?  Biliary sludge ?  Gastroenteritis ?  ? ?Discharge Medications: ?Allergies as of 04/04/2022   ?No Known Allergies ?  ? ?  ?Medication List  ?  ? ?STOP taking these medications   ? ?acetaminophen 500 MG tablet ?Commonly known as: TYLENOL ?  ? ?  ? ?TAKE these medications   ? ?amLODipine 10 MG tablet ?Commonly known as: NORVASC ?Take 1 tablet (10 mg total) by mouth daily. ?  ?atorvastatin 20 MG tablet ?Commonly known as: LIPITOR ?Take 20 mg by mouth daily. ?  ?Baclofen 5 MG Tabs ?Take 5 mg by mouth every 6 (six) hours as needed (muscle spams). ?  ?CertaVite/Antioxidants Tabs ?Take 1 tablet by mouth daily. ?Start taking on: April 05, 2022 ?  ?Gerhardt's butt cream Crea ?Apply 1 application. topically 2 (two) times daily. ?  ?HYDROcodone-acetaminophen 5-325 MG tablet ?Commonly known as: NORCO/VICODIN ?Take 1 tablet by mouth every 6 (six) hours as needed. ?  ?LEVOTHYROXINE SODIUM PO ?Take 50 mcg by mouth. ?  ?metoprolol tartrate 50 MG tablet ?Commonly known as: LOPRESSOR ?Take 1 tablet (50 mg total) by mouth 2 (two) times daily. ?  ?mirtazapine 7.5 MG tablet ?Commonly known as: REMERON ?Take 7.5 mg by mouth at bedtime. ?  ?potassium chloride SA 20 MEQ tablet ?Commonly known as: KLOR-CON M ?Take 40 mEq by mouth 2 (two) times daily. ?  ?sodium bicarbonate 650 MG tablet ?Take 1 tablet (650 mg total) by mouth 3 (three) times daily. ?  ?traZODone 50 MG tablet ?Commonly known as: DESYREL ?Take 1 tablet (50 mg total) by mouth at bedtime as needed for sleep. ?   ?Vitamin D3 50 MCG (2000 UT) Tabs ?Take 50 mcg by mouth daily. ?  ? ?  ? ?  ?  ? ? ?  ?Discharge Care Instructions  ?(From admission, onward)  ?  ? ? ?  ? ?  Start     Ordered  ? 04/04/22 0000  Leave dressing on - Keep it clean, dry, and intact until clinic visit       ? 04/04/22 1352  ? ?  ?  ? ?  ? ? ?Disposition and follow-up:   ?Mr.Terry Barton was discharged from [facility] in Stable condition.  At the hospital follow up visit please address: ? ?1.  Follow-up: ? a.  Acute on chronic renal failure.  Likely ATN.  Urine studies collected and in process to further evaluate for CKD associated RTA.  He has been started on sodium bicarb 650 3 times daily, will need to reach at least bicarb of 20.  Will likely need to establish with nephrologist. ?  ? b.  Urinary tract infection.  Treated with 7 days of antibiotics asymptomatic on discharge most likely ? ? c.  Leukocytosis.  Asymptomatic.  Unknown etiology continued.  Otherwise stable. ? ? d.  Nausea vomiting.  Resolved. ? ? E.  Macrocytic anemia.  Started on multivitamin. ? ? F.  Cervical spinal injury.  Patient has shown progressive decline over recent months.  He is bedbound and relies on son  for majority of his care..  Will need to discuss outpatient palliative care services. ? ?2.  Labs / imaging needed at time of follow-up: CBC, BMP ? ?3.  Pending labs/ test needing follow-up:  urine sodium potassium and chloride ? ?4.  Medication Changes ? Started: Sodium bicarbonate 650 3 times daily ? ?Follow-up Appointments: ? Follow-up Information   ? ? Arthur Holms, NP Follow up in 1 week(s).   ?Specialty: Nurse Practitioner ?Contact information: ?[place] ?[place] [address] ?[phone] ? ? ?  ?  ? ?  ?  ? ?  ? ? ?Hospital Course by problem list:  ? ?Acute on chronic renal failure.   ?Patient presented with complaints of nausea vomiting poor p.o. intake.  Noted to have significant AKI.  He was given IV fluids without significant improvement in  his renal function.  This was felt to be due to ATN given his hypovolemia on admission.  He was treated supportively.  He was also noted to have a nongap metabolic acidosis, likely related to his CKD or CKD associated RTA.  Serum potassium and phos required frequent repletion.  For his acidosis his he was started on sodium bicarb, and potassium losses stabilized, however bicarb remains low.  Discharged home with sodium bicarb tablets.  Patient also instructed to follow-up with nephrology as an outpatient. ? ?Urinary tract infection ?Patient noted to have leukocytosis and UA suggestive of urinary tract infection.  CT scan of the abdomen on admission showed perinephric stranding suggestive of pyelonephritis.  He was started on IV antibiotics.  urine culture was obtained which showed greater than 100,000 colonies of E. coli and group B strep.  Sensitivities indicated efficacy of cefazolin and he was transitioned to cefazolin from IV.  He completed a 7-day course due to complicated nature of infection.  He otherwise remained afebrile and asymptomatic. ? ?Nausea, vomiting, food aversion, moderate malnutrition ?Patient was noted to have nausea vomiting and food aversion on admission.  Abdominal imaging showed biliary sludge and cholelithiasis, but no signs of obstruction or acute cholecystitis. Given his comorbidities, he would not be a good candidate for elective cholecystectomy. ? ?Discharge Subjective: ?Denies pain, cough, nausea, vomiting, diarrhea, states he has not had any diarrhea.  Does complain of not being able to feed himself. ? ?Discharge Exam:   ?BP 129/62 (BP Location: Right Arm)   Pulse 74   Temp 98.2 ?F (36.8 ?C) (Axillary)   Resp 15   Ht 6' (1.829 m)   Wt 94.4 kg   SpO2 95%   BMI 28.23 kg/m?  ?Constitutional: Elderly appearing, chronically ill ?HENT: normocephalic atraumatic, temporal wasting, edentulous, dentures in place with ?Eyes: conjunctiva non-erythematous ?Neck: supple ?Cardiovascular:  regular rate and rhythm, no m/r/g ?Pulmonary/Chest: normal work of breathing on room air, lungs clear to auscultation bilaterally ?Abdominal: Soft, nontender, nondistended.  Positive bowel sounds ?MSK: Sarcopenia, sacral decubitus ulcer.  No edema, erythema, induration.  No drainage.  Does not appear to be infected currently. ?Neurological: alert & oriented x 3 ?Skin: warm and dry ?Psych: Normal mood and thought process ? ?Pertinent Labs, Studies, and Procedures:  ? ?  Latest Ref Rng & Units 04/04/2022  ?  7:20 AM 04/04/2022  ?  2:21 AM 04/03/2022  ? 12:43 AM  ?CBC  ?WBC 4.0 - 10.5 K/uL 14.2   16.8   15.3    ?Hemoglobin 13.0 - 17.0 g/dL 9.2   8.4   8.7    ?Hematocrit 39.0 - 52.0 % 28.4   26.9  27.8    ?Platelets 150 - 400 K/uL 328   349   324    ? ? ? ?  Latest Ref Rng & Units 04/04/2022  ?  2:21 AM 04/03/2022  ? 12:43 AM 04/02/2022  ?  4:34 PM  ?CMP  ?Glucose 70 - 99 mg/dL 119   144     ?BUN 8 - 23 mg/dL 37   38     ?Creatinine 0.61 - 1.24 mg/dL 2.98   3.02     ?Sodium 135 - 145 mmol/L 136   137     ?Potassium 3.5 - 5.1 mmol/L 3.7   4.2   3.6    ?Chloride 98 - 111 mmol/L 115   117     ?CO2 22 - 32 mmol/L 13   14     ?Calcium 8.9 - 10.3 mg/dL 7.9   7.9     ? ? ?CT Abdomen Pelvis Wo Contrast ? ?Result Date: 03/29/2022 ?CLINICAL DATA:  Abdominal pain. EXAM: CT ABDOMEN AND PELVIS WITHOUT CONTRAST TECHNIQUE: Multidetector CT imaging of the abdomen and pelvis was performed following the standard protocol without IV contrast. RADIATION DOSE REDUCTION: This exam was performed according to the departmental dose-optimization program which includes automated exposure control, adjustment of the mA and/or kV according to patient size and/or use of iterative reconstruction technique. COMPARISON:  None. FINDINGS: Lower chest: Moderate right pleural effusion. Hazy ground-glass opacities with peripheral distribution in bilateral lung bases. Calcific atherosclerotic disease of the coronary arteries. Hepatobiliary: Normal appearance of the  liver. Large amount of sludge and gallstones within the gallbladder lumen. 9 mm calcific density along the posterior wall of the gallbladder may represent layering calculi or potentially wall calcifications. Pancreas: U

## 2022-04-04 NOTE — Progress Notes (Signed)
Occupational Therapy Treatment ?Patient Details ?Name: Terry Barton ?MRN: [id number] ?DOB: 11/01/1956 ?Today's Date: 04/04/2022 ? ? ?History of present illness 66 yo male presenting to ED on 4/22 with nausea and vomiting. Amitted for acute on chronic renal failure. PMH including cervical spine injury, CVA with residual right-sided deficits, HTN, hypothyroidism, T2DM, CKD IIIb, and bowel/bladder incontinence. ?  ?OT comments ? Pt. Seen for skilled OT treatment session.  Pt. Able to use L UE to bring beverage to mouth as part of self feeding.  Requires min a for all aspects of oral care secondary to difficulty managing max/mand. Dentures when adhesive in place.  Encouragement to have hob elevated during beverage consumption as pt. Was coughing intermittently.  He cont. To request hob lowered even with education on risks of drinking while laying down.    ? ?Recommendations for follow up therapy are one component of a multi-disciplinary discharge planning process, led by the attending physician.  Recommendations may be updated based on patient status, additional functional criteria and insurance authorization. ?   ?Follow Up Recommendations ? No OT follow up  ?  ?Assistance Recommended at Discharge Frequent or constant Supervision/Assistance  ?Patient can return home with the following ?   ?  ?Equipment Recommendations ? Hospital bed  ?  ?Recommendations for Other Services   ? ?  ?Precautions / Restrictions Precautions ?Precautions: Fall  ? ? ?  ? ?Mobility Bed Mobility ?  ?  ?  ?  ?  ?  ?  ?General bed mobility comments: total a with use of pads to pull pt. up in bed and reposition.  encouragement for hob elevation ?  ? ?Transfers ?  ?  ?  ?  ?  ?  ?  ?  ?  ?  ?  ?  ?Balance   ?  ?  ?  ?  ?  ?  ?  ?  ?  ?  ?  ?  ?  ?  ?  ?  ?  ?  ?   ? ?ADL either performed or assessed with clinical judgement  ? ?ADL Overall ADL's : At baseline;Needs assistance/impaired ?  ?Eating/Feeding Details (indicate cue type and reason): Able to  bring drinks to mouth with LUE ?Grooming: Oral care;Bed level;Min guard;Minimal assistance ?Grooming Details (indicate cue type and reason): oral care-assistance for removal of max/mand dentures-unable to to without assistance secondary to adhesive makes it difficult to get a hold of and pull out of mouth. assistance with rinse cup/spitting into basin ?  ?  ?  ?  ?  ?  ?  ?  ?  ?  ?  ?  ?  ?  ?  ?General ADL Comments: total A for bed mobility-education and need to have hob as elevated as possible during beverage/food consumption.  he cont. to want hob lowered secondary to discomfort but was also notably coughing.  stated it was due to ill fitting dentures-mand. greater of issues than max. rec. sch. apt. at dds once home for possible relining and adj. for better fit.  educated also on need to remove both at night and have them cleaned.  pt. reports that they had not been removed previous night.  assisted with removal and placed in denture cup.  assisted with oral care.  not tolerating any elevation of head with multiple points of education and encouragement esp. during beverage consumption. ?  ? ?Extremity/Trunk Assessment   ?  ?  ?  ?  ?  ? ?  Vision   ?  ?  ?Perception   ?  ?Praxis   ?  ? ?Cognition Arousal/Alertness: Awake/alert ?Behavior During Therapy: [facility] for tasks assessed/performed ?Overall Cognitive Status: No family/caregiver present to determine baseline cognitive functioning ?  ?  ?  ?  ?  ?  ?  ?  ?  ?  ?  ?  ?  ?  ?  ?  ?  ?  ?  ?   ?Exercises   ? ?  ?Shoulder Instructions   ? ? ?  ?General Comments    ? ? ?Pertinent Vitals/ Pain       Pain Assessment ?Pain Assessment: Faces ?Faces Pain Scale: Hurts little more ?Pain Location: back ?Pain Descriptors / Indicators: Discomfort, Grimacing ?Pain Intervention(s): Repositioned ? ?Home Living   ?  ?  ?  ?  ?  ?  ?  ?  ?  ?  ?  ?  ?  ?  ?  ?  ?  ?  ? ?  ?Prior Functioning/Environment    ?  ?  ?  ?   ? ?Frequency ? Min 1X/week  ? ? ? ? ?  ?Progress Toward  Goals ? ?OT Goals(current goals can now be found in the care plan section) ? Progress towards OT goals: Progressing toward goals ? ?   ?Plan     ? ?Co-evaluation ? ? ?   ?  ?  ?  ?  ? ?  ?AM-PAC OT "6 Clicks" Daily Activity     ?Outcome Measure ? ? Help from another person eating meals?: A Lot ?Help from another person taking care of personal grooming?: Total ?Help from another person toileting, which includes using toliet, bedpan, or urinal?: Total ?Help from another person bathing (including washing, rinsing, drying)?: Total ?Help from another person to put on and taking off regular upper body clothing?: Total ?Help from another person to put on and taking off regular lower body clothing?: Total ?6 Click Score: 7 ? ?  ?End of Session   ? ?OT Visit Diagnosis: Unsteadiness on feet (R26.81);Other abnormalities of gait and mobility (R26.89);Muscle weakness (generalized) (M62.81) ?  ?Activity Tolerance Patient tolerated treatment well ?  ?Patient Left in bed;with call bell/phone within reach;with bed alarm set ?  ?Nurse Communication   ?  ? ?   ? ?Time: 1749-4496 ?OT Time Calculation (min): 21 min ? ?Charges: OT General Charges ?$OT Visit: 1 Visit ?OT Treatments ?$Self Care/Home Management : 8-22 mins ? ?Boneta Lucks, COTA/L ?Acute Rehabilitation ?[phone]  ? ?Salvadore Oxford ?04/04/2022, 11:59 AM ?

## 2022-04-04 NOTE — Care Management (Signed)
[facility] called, they state they have 29 patients  ahead. I called son [name] to let him know it will be very much later in the evening.  He asked if we could call when patient leaves the floor. Secure messaged RN to pass along in report. ?

## 2022-04-05 NOTE — Progress Notes (Signed)
Patient left via [facility] on stretcher.  All belongings placed in bag including [facility] prescription bag and given to patient.  Patient in no acute distress.   ?

## 2022-04-05 NOTE — Progress Notes (Signed)
Contacted son Terry Barton. At [phone] to verify his brother [name]'s address r/t pt stating the address on chart wasn't correct. Ilir confirmed address on chart wasn't correct and the correct address of [address] [place] [address].Marland Kitchen Have attempted to call number on chart for [name] and have left voice mails per Sabino Gasser (pt's nurse) number called [phone] ?

## 2022-06-01 ENCOUNTER — Emergency Department (HOSPITAL_COMMUNITY): Payer: Medicare Other

## 2022-06-01 ENCOUNTER — Encounter (HOSPITAL_COMMUNITY): Payer: Self-pay | Admitting: Emergency Medicine

## 2022-06-01 ENCOUNTER — Inpatient Hospital Stay (HOSPITAL_COMMUNITY)
Admission: EM | Admit: 2022-06-01 | Discharge: 2022-06-08 | DRG: 503 | Disposition: A | Discharge status: Home / Self Care | Payer: Medicare Other | Attending: Internal Medicine | Admitting: Internal Medicine

## 2022-06-01 DIAGNOSIS — I129 Hypertensive chronic kidney disease with stage 1 through stage 4 chronic kidney disease, or unspecified chronic kidney disease: Secondary | ICD-10-CM | POA: Diagnosis present

## 2022-06-01 DIAGNOSIS — Z79891 Long term (current) use of opiate analgesic: Secondary | ICD-10-CM | POA: Diagnosis not present

## 2022-06-01 DIAGNOSIS — I10 Essential (primary) hypertension: Secondary | ICD-10-CM | POA: Diagnosis not present

## 2022-06-01 DIAGNOSIS — R001 Bradycardia, unspecified: Secondary | ICD-10-CM | POA: Diagnosis present

## 2022-06-01 DIAGNOSIS — K5900 Constipation, unspecified: Secondary | ICD-10-CM | POA: Diagnosis not present

## 2022-06-01 DIAGNOSIS — E1152 Type 2 diabetes mellitus with diabetic peripheral angiopathy with gangrene: Secondary | ICD-10-CM | POA: Diagnosis present

## 2022-06-01 DIAGNOSIS — N189 Chronic kidney disease, unspecified: Secondary | ICD-10-CM

## 2022-06-01 DIAGNOSIS — G8220 Paraplegia, unspecified: Secondary | ICD-10-CM | POA: Diagnosis present

## 2022-06-01 DIAGNOSIS — D638 Anemia in other chronic diseases classified elsewhere: Secondary | ICD-10-CM | POA: Diagnosis present

## 2022-06-01 DIAGNOSIS — N170 Acute kidney failure with tubular necrosis: Secondary | ICD-10-CM | POA: Diagnosis not present

## 2022-06-01 DIAGNOSIS — G4700 Insomnia, unspecified: Secondary | ICD-10-CM | POA: Diagnosis present

## 2022-06-01 DIAGNOSIS — M87078 Idiopathic aseptic necrosis of left toe(s): Secondary | ICD-10-CM | POA: Diagnosis not present

## 2022-06-01 DIAGNOSIS — E872 Acidosis, unspecified: Secondary | ICD-10-CM | POA: Diagnosis present

## 2022-06-01 DIAGNOSIS — Z9181 History of falling: Secondary | ICD-10-CM | POA: Diagnosis not present

## 2022-06-01 DIAGNOSIS — Z7401 Bed confinement status: Secondary | ICD-10-CM | POA: Diagnosis not present

## 2022-06-01 DIAGNOSIS — E782 Mixed hyperlipidemia: Secondary | ICD-10-CM | POA: Diagnosis not present

## 2022-06-01 DIAGNOSIS — Z89412 Acquired absence of left great toe: Secondary | ICD-10-CM | POA: Diagnosis not present

## 2022-06-01 DIAGNOSIS — E1122 Type 2 diabetes mellitus with diabetic chronic kidney disease: Secondary | ICD-10-CM | POA: Diagnosis present

## 2022-06-01 DIAGNOSIS — I6990 Unspecified sequelae of unspecified cerebrovascular disease: Secondary | ICD-10-CM | POA: Diagnosis not present

## 2022-06-01 DIAGNOSIS — Y835 Amputation of limb(s) as the cause of abnormal reaction of the patient, or of later complication, without mention of misadventure at the time of the procedure: Secondary | ICD-10-CM | POA: Diagnosis present

## 2022-06-01 DIAGNOSIS — N1832 Chronic kidney disease, stage 3b: Secondary | ICD-10-CM | POA: Diagnosis present

## 2022-06-01 DIAGNOSIS — M869 Osteomyelitis, unspecified: Secondary | ICD-10-CM | POA: Diagnosis present

## 2022-06-01 DIAGNOSIS — T8754 Necrosis of amputation stump, left lower extremity: Principal | ICD-10-CM | POA: Diagnosis present

## 2022-06-01 DIAGNOSIS — F32A Depression, unspecified: Secondary | ICD-10-CM | POA: Diagnosis present

## 2022-06-01 DIAGNOSIS — E039 Hypothyroidism, unspecified: Secondary | ICD-10-CM | POA: Diagnosis present

## 2022-06-01 DIAGNOSIS — Z79890 Hormone replacement therapy (postmenopausal): Secondary | ICD-10-CM | POA: Diagnosis not present

## 2022-06-01 DIAGNOSIS — Z79899 Other long term (current) drug therapy: Secondary | ICD-10-CM

## 2022-06-01 DIAGNOSIS — E785 Hyperlipidemia, unspecified: Secondary | ICD-10-CM | POA: Diagnosis present

## 2022-06-01 DIAGNOSIS — E1169 Type 2 diabetes mellitus with other specified complication: Secondary | ICD-10-CM | POA: Diagnosis present

## 2022-06-01 DIAGNOSIS — M86172 Other acute osteomyelitis, left ankle and foot: Secondary | ICD-10-CM | POA: Diagnosis present

## 2022-06-01 DIAGNOSIS — I69351 Hemiplegia and hemiparesis following cerebral infarction affecting right dominant side: Secondary | ICD-10-CM | POA: Diagnosis not present

## 2022-06-01 DIAGNOSIS — I96 Gangrene, not elsewhere classified: Secondary | ICD-10-CM

## 2022-06-01 DIAGNOSIS — R600 Localized edema: Secondary | ICD-10-CM

## 2022-06-01 DIAGNOSIS — M86672 Other chronic osteomyelitis, left ankle and foot: Secondary | ICD-10-CM | POA: Diagnosis not present

## 2022-06-01 HISTORY — DX: Anemia, unspecified: D64.9

## 2022-06-01 HISTORY — DX: Chronic kidney disease, unspecified: N18.9

## 2022-06-01 HISTORY — DX: Bed confinement status: Z74.01

## 2022-06-01 HISTORY — DX: Type 2 diabetes mellitus without complications: E11.9

## 2022-06-01 HISTORY — DX: Hypothyroidism, unspecified: E03.9

## 2022-06-01 HISTORY — DX: Hyperlipidemia, unspecified: E78.5

## 2022-06-01 LAB — BASIC METABOLIC PANEL
Anion gap: 7 (ref 5–15)
BUN: 27 mg/dL — ABNORMAL HIGH (ref 8–23)
CO2: 18 mmol/L — ABNORMAL LOW (ref 22–32)
Calcium: 8.9 mg/dL (ref 8.9–10.3)
Chloride: 110 mmol/L (ref 98–111)
Creatinine, Ser: 1.73 mg/dL — ABNORMAL HIGH (ref 0.61–1.24)
GFR, Estimated: 42 mL/min — ABNORMAL LOW (ref >60)
Glucose, Bld: 91 mg/dL (ref 70–99)
Potassium: 4.4 mmol/L (ref 3.5–5.1)
Sodium: 135 mmol/L (ref 135–145)

## 2022-06-01 LAB — CBC WITH DIFFERENTIAL/PLATELET
Abs Immature Granulocytes: 0.07 10*3/uL (ref 0.00–0.07)
Basophils Absolute: 0.1 10*3/uL (ref 0.0–0.1)
Basophils Relative: 1 %
Eosinophils Absolute: 0.4 10*3/uL (ref 0.0–0.5)
Eosinophils Relative: 3 %
HCT: 32.5 % — ABNORMAL LOW (ref 39.0–52.0)
Hemoglobin: 10.2 g/dL — ABNORMAL LOW (ref 13.0–17.0)
Immature Granulocytes: 1 %
Lymphocytes Relative: 27 %
Lymphs Abs: 2.8 10*3/uL (ref 0.7–4.0)
MCH: 31.5 pg (ref 26.0–34.0)
MCHC: 31.4 g/dL (ref 30.0–36.0)
MCV: 100.3 fL — ABNORMAL HIGH (ref 80.0–100.0)
Monocytes Absolute: 0.7 10*3/uL (ref 0.1–1.0)
Monocytes Relative: 7 %
Neutro Abs: 6.5 10*3/uL (ref 1.7–7.7)
Neutrophils Relative %: 61 %
Platelets: 351 10*3/uL (ref 150–400)
RBC: 3.24 MIL/uL — ABNORMAL LOW (ref 4.22–5.81)
RDW: 12.5 % (ref 11.5–15.5)
WBC: 10.6 10*3/uL — ABNORMAL HIGH (ref 4.0–10.5)
nRBC: 0 % (ref 0.0–0.2)

## 2022-06-01 MED ORDER — VANCOMYCIN HCL 1750 MG/350ML IV SOLN
1750.0000 mg | Freq: Once | INTRAVENOUS | Status: AC
  Administered 2022-06-01: 1750 mg via INTRAVENOUS
  Filled 2022-06-01: qty 350

## 2022-06-01 MED ORDER — TRAZODONE HCL 50 MG PO TABS: 50.0000 mg | ORAL_TABLET | Freq: Every evening | ORAL | Status: DC | PRN

## 2022-06-01 MED ORDER — ATORVASTATIN CALCIUM 10 MG PO TABS
20.0000 mg | ORAL_TABLET | Freq: Every day | ORAL | Status: DC
  Administered 2022-06-02 – 2022-06-07 (×6): 20 mg via ORAL
  Filled 2022-06-01 (×6): qty 2

## 2022-06-01 MED ORDER — MIRTAZAPINE 15 MG PO TABS
7.5000 mg | ORAL_TABLET | Freq: Every day | ORAL | Status: DC
  Administered 2022-06-02 – 2022-06-07 (×6): 7.5 mg via ORAL
  Filled 2022-06-01 (×6): qty 1

## 2022-06-01 MED ORDER — ACETAMINOPHEN 650 MG RE SUPP: 650.0000 mg | Freq: Four times a day (QID) | RECTAL | Status: DC | PRN

## 2022-06-01 MED ORDER — AMLODIPINE BESYLATE 10 MG PO TABS
10.0000 mg | ORAL_TABLET | Freq: Every day | ORAL | Status: DC
  Administered 2022-06-02 – 2022-06-03 (×2): 10 mg via ORAL
  Filled 2022-06-01 (×2): qty 1

## 2022-06-01 MED ORDER — SODIUM BICARBONATE 650 MG PO TABS
650.0000 mg | ORAL_TABLET | Freq: Three times a day (TID) | ORAL | Status: DC
  Administered 2022-06-02 – 2022-06-05 (×10): 650 mg via ORAL
  Filled 2022-06-01 (×10): qty 1

## 2022-06-01 MED ORDER — METOPROLOL TARTRATE 25 MG PO TABS: 50.0000 mg | ORAL_TABLET | Freq: Two times a day (BID) | ORAL | Status: DC

## 2022-06-01 MED ORDER — LEVOTHYROXINE SODIUM 50 MCG PO TABS
50.0000 ug | ORAL_TABLET | Freq: Every day | ORAL | Status: DC
  Administered 2022-06-02 – 2022-06-08 (×7): 50 ug via ORAL
  Filled 2022-06-01 (×5): qty 1
  Filled 2022-06-01: qty 2
  Filled 2022-06-01: qty 1

## 2022-06-01 MED ORDER — ACETAMINOPHEN 325 MG PO TABS: 650.0000 mg | ORAL_TABLET | Freq: Four times a day (QID) | ORAL | Status: DC | PRN

## 2022-06-01 MED ORDER — PIPERACILLIN-TAZOBACTAM 3.375 G IVPB
3.3750 g | Freq: Three times a day (TID) | INTRAVENOUS | Status: DC
  Administered 2022-06-01 – 2022-06-02 (×2): 3.375 g via INTRAVENOUS
  Filled 2022-06-01 (×2): qty 50

## 2022-06-01 MED ORDER — VANCOMYCIN HCL 750 MG/150ML IV SOLN
750.0000 mg | Freq: Two times a day (BID) | INTRAVENOUS | Status: DC
  Filled 2022-06-01: qty 150

## 2022-06-01 NOTE — ED Triage Notes (Signed)
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

## 2022-06-02 ENCOUNTER — Inpatient Hospital Stay (HOSPITAL_COMMUNITY): Payer: Medicare Other

## 2022-06-02 DIAGNOSIS — E782 Mixed hyperlipidemia: Secondary | ICD-10-CM | POA: Diagnosis not present

## 2022-06-02 DIAGNOSIS — M86672 Other chronic osteomyelitis, left ankle and foot: Secondary | ICD-10-CM

## 2022-06-02 DIAGNOSIS — I10 Essential (primary) hypertension: Secondary | ICD-10-CM

## 2022-06-02 DIAGNOSIS — M87078 Idiopathic aseptic necrosis of left toe(s): Secondary | ICD-10-CM

## 2022-06-02 DIAGNOSIS — M869 Osteomyelitis, unspecified: Secondary | ICD-10-CM | POA: Diagnosis not present

## 2022-06-02 DIAGNOSIS — E039 Hypothyroidism, unspecified: Secondary | ICD-10-CM

## 2022-06-02 DIAGNOSIS — N189 Chronic kidney disease, unspecified: Secondary | ICD-10-CM | POA: Diagnosis not present

## 2022-06-02 LAB — CBC
HCT: 33.7 % — ABNORMAL LOW (ref 39.0–52.0)
Hemoglobin: 10.2 g/dL — ABNORMAL LOW (ref 13.0–17.0)
MCH: 31.3 pg (ref 26.0–34.0)
MCHC: 30.3 g/dL (ref 30.0–36.0)
MCV: 103.4 fL — ABNORMAL HIGH (ref 80.0–100.0)
Platelets: 322 10*3/uL (ref 150–400)
RBC: 3.26 MIL/uL — ABNORMAL LOW (ref 4.22–5.81)
RDW: 12.4 % (ref 11.5–15.5)
WBC: 9.5 10*3/uL (ref 4.0–10.5)
nRBC: 0 % (ref 0.0–0.2)

## 2022-06-02 LAB — BASIC METABOLIC PANEL
Anion gap: 9 (ref 5–15)
BUN: 27 mg/dL — ABNORMAL HIGH (ref 8–23)
CO2: 14 mmol/L — ABNORMAL LOW (ref 22–32)
Calcium: 8.8 mg/dL — ABNORMAL LOW (ref 8.9–10.3)
Chloride: 113 mmol/L — ABNORMAL HIGH (ref 98–111)
Creatinine, Ser: 1.72 mg/dL — ABNORMAL HIGH (ref 0.61–1.24)
GFR, Estimated: 43 mL/min — ABNORMAL LOW (ref >60)
Glucose, Bld: 85 mg/dL (ref 70–99)
Potassium: 4.7 mmol/L (ref 3.5–5.1)
Sodium: 136 mmol/L (ref 135–145)

## 2022-06-02 MED ORDER — SODIUM CHLORIDE 0.9 % IV SOLN
2.0000 g | Freq: Two times a day (BID) | INTRAVENOUS | Status: DC
  Administered 2022-06-02: 2 g via INTRAVENOUS
  Filled 2022-06-02 (×2): qty 12.5

## 2022-06-02 MED ORDER — CHLORHEXIDINE GLUCONATE CLOTH 2 % EX PADS
6.0000 | MEDICATED_PAD | Freq: Once | CUTANEOUS | Status: AC
  Administered 2022-06-03: 6 via TOPICAL

## 2022-06-02 MED ORDER — METRONIDAZOLE 500 MG/100ML IV SOLN
500.0000 mg | Freq: Two times a day (BID) | INTRAVENOUS | Status: DC
  Administered 2022-06-02 – 2022-06-05 (×6): 500 mg via INTRAVENOUS
  Filled 2022-06-02 (×6): qty 100

## 2022-06-02 MED ORDER — GUAIFENESIN 100 MG/5ML PO LIQD: 5.0000 mL | ORAL | Status: DC | PRN

## 2022-06-02 MED ORDER — IPRATROPIUM-ALBUTEROL 0.5-2.5 (3) MG/3ML IN SOLN: 3.0000 mL | RESPIRATORY_TRACT | Status: DC | PRN

## 2022-06-02 MED ORDER — METOPROLOL TARTRATE 5 MG/5ML IV SOLN: 5.0000 mg | INTRAVENOUS | Status: DC | PRN

## 2022-06-02 MED ORDER — CHLORHEXIDINE GLUCONATE CLOTH 2 % EX PADS
6.0000 | MEDICATED_PAD | Freq: Once | CUTANEOUS | Status: AC
Start: 2022-06-02 — End: 2022-06-02
  Administered 2022-06-02: 6 via TOPICAL

## 2022-06-02 MED ORDER — ONDANSETRON HCL 4 MG/2ML IJ SOLN: 4.0000 mg | Freq: Four times a day (QID) | INTRAMUSCULAR | Status: DC | PRN

## 2022-06-02 MED ORDER — SODIUM CHLORIDE 0.9 % IV SOLN: INTRAVENOUS | Status: AC

## 2022-06-02 MED ORDER — SENNOSIDES-DOCUSATE SODIUM 8.6-50 MG PO TABS: 1.0000 | ORAL_TABLET | Freq: Every evening | ORAL | Status: DC | PRN

## 2022-06-02 MED ORDER — VANCOMYCIN HCL 1500 MG/300ML IV SOLN
1500.0000 mg | INTRAVENOUS | Status: DC
Start: 2022-06-02 — End: 2022-06-05
  Administered 2022-06-02 – 2022-06-05 (×3): 1500 mg via INTRAVENOUS
  Filled 2022-06-02 (×4): qty 300

## 2022-06-02 MED ORDER — TRAZODONE HCL 50 MG PO TABS
50.0000 mg | ORAL_TABLET | Freq: Every evening | ORAL | Status: DC | PRN
  Administered 2022-06-03 – 2022-06-07 (×5): 50 mg via ORAL
  Filled 2022-06-02 (×5): qty 1

## 2022-06-02 MED ORDER — OXYCODONE HCL 5 MG PO TABS
5.0000 mg | ORAL_TABLET | ORAL | Status: DC | PRN
  Administered 2022-06-02 – 2022-06-06 (×6): 5 mg via ORAL
  Filled 2022-06-02 (×7): qty 1

## 2022-06-02 MED ORDER — HYDRALAZINE HCL 20 MG/ML IJ SOLN: 10.0000 mg | INTRAMUSCULAR | Status: DC | PRN

## 2022-06-02 NOTE — Consult Note (Addendum)
Reason for Consult:Ostoemyelitis  Referring Physician: Dr. Stephania Fragmin, MD  Terry Barton is an 66 y.o. male.  HPI: 66 year old male with past medical history significant for spinal cord injury due to fall in 2016, stroke in 2021, hypertension, hyperlipidemia, hypothyroidism, CKD stage IIIa, anemia with history of left foot amputation and partial potation of the left second toe presents to the emergency room for left second toe necrosis at the remaining digit.  X-rays were obtained that time and revealed concerns for osteomyelitis.  He was started on IV antibiotics.  Patient states the start about 1 week ago.  Past Medical History:  Diagnosis Date   Hypertension    Intracranial bleeding (HCC)    Stroke [facility])     History reviewed. No pertinent surgical history.  Family History  Family history unknown: Yes    Social History:  reports that he has never smoked. He has never used smokeless tobacco. He reports that he does not currently use alcohol. He reports that he does not currently use drugs.  Allergies: No Known Allergies  Medications: I have reviewed the patient's current medications.  Results for orders placed or performed during the hospital encounter of 06/01/22 (from the past 48 hour(s))  CBC with Differential     Status: Abnormal   Collection Time: 06/01/22  9:51 PM  Result Value Ref Range   WBC 10.6 (H) 4.0 - 10.5 K/uL   RBC 3.24 (L) 4.22 - 5.81 MIL/uL   Hemoglobin 10.2 (L) 13.0 - 17.0 g/dL   HCT 57.8 (L) 46.9 - 62.9 %   MCV 100.3 (H) 80.0 - 100.0 fL   MCH 31.5 26.0 - 34.0 pg   MCHC 31.4 30.0 - 36.0 g/dL   RDW 52.8 41.3 - 24.4 %   Platelets 351 150 - 400 K/uL   nRBC 0.0 0.0 - 0.2 %   Neutrophils Relative % 61 %   Neutro Abs 6.5 1.7 - 7.7 K/uL   Lymphocytes Relative 27 %   Lymphs Abs 2.8 0.7 - 4.0 K/uL   Monocytes Relative 7 %   Monocytes Absolute 0.7 0.1 - 1.0 K/uL   Eosinophils Relative 3 %   Eosinophils Absolute 0.4 0.0 - 0.5 K/uL   Basophils Relative 1 %    Basophils Absolute 0.1 0.0 - 0.1 K/uL   Immature Granulocytes 1 %   Abs Immature Granulocytes 0.07 0.00 - 0.07 K/uL    Comment: Performed at [facility], [address] [place], [place] [address]  Basic metabolic panel     Status: Abnormal   Collection Time: 06/01/22  9:51 PM  Result Value Ref Range   Sodium 135 135 - 145 mmol/L   Potassium 4.4 3.5 - 5.1 mmol/L   Chloride 110 98 - 111 mmol/L   CO2 18 (L) 22 - 32 mmol/L   Glucose, Bld 91 70 - 99 mg/dL    Comment: Glucose reference range applies only to samples taken after fasting for at least 8 hours.   BUN 27 (H) 8 - 23 mg/dL   Creatinine, Ser 2.53 (H) 0.61 - 1.24 mg/dL   Calcium 8.9 8.9 - 66.4 mg/dL   GFR, Estimated 42 (L) >60 mL/min    Comment: (NOTE) Calculated using the CKD-EPI Creatinine Equation (2021)    Anion gap 7 5 - 15    Comment: Performed at [facility], [address] [place], [place] [address]  CBC     Status: Abnormal   Collection Time: 06/02/22  2:44 AM  Result Value Ref Range   WBC 9.5 4.0 - 10.5 K/uL   RBC 3.26 (L) 4.22 - 5.81 MIL/uL   Hemoglobin 10.2 (L) 13.0 - 17.0 g/dL   HCT 09.8 (L) 11.9 - 14.7 %   MCV 103.4 (H) 80.0 - 100.0 fL   MCH 31.3 26.0 - 34.0 pg   MCHC 30.3 30.0 - 36.0 g/dL   RDW 82.9 56.2 - 13.0 %   Platelets 322 150 - 400 K/uL   nRBC 0.0 0.0 - 0.2 %    Comment: Performed at [facility], [address], [place], [place] [address]  Basic metabolic panel     Status: Abnormal   Collection Time: 06/02/22  2:44 AM  Result Value Ref Range   Sodium 136 135 - 145 mmol/L   Potassium 4.7 3.5 - 5.1 mmol/L   Chloride 113 (H) 98 - 111 mmol/L   CO2 14 (L) 22 - 32 mmol/L   Glucose, Bld 85 70 - 99 mg/dL    Comment: Glucose reference range applies only to samples taken after fasting for at least 8 hours.   BUN 27 (H) 8 - 23 mg/dL   Creatinine, Ser 4.69 (H) 0.61 - 1.24 mg/dL   Calcium 8.8 (L) 8.9 - 10.3 mg/dL   GFR, Estimated 43 (L) >60 mL/min    Comment: (NOTE) Calculated using  the CKD-EPI Creatinine Equation (2021)    Anion gap 9 5 - 15    Comment: Performed at [facility], [address] [place], [place] [address]    VAS [place] ABI WITH/WO TBI  Result Date: 06/02/2022  LOWER EXTREMITY DOPPLER STUDY Patient Name:  Terry Barton  Date of Exam:   06/02/2022 Medical Rec #: [id number]     Accession #:    [id number] Date of Birth: 01/25/1956     Patient Gender: M Patient Age:   55 years Exam Location:  [facility] Procedure:      VAS [place] ABI WITH/WO TBI Referring Phys: Ulyess Blossom RATHORE --------------------------------------------------------------------------------  Indications: Left toe necrosis at amputation stump. High Risk Factors: Hypertension, hyperlipidemia, Diabetes. Other Factors: History of left great toe amputation and partial 2nd toe                amputation. CKD IIIa, anemia.  Comparison Study: No prior study Performing Technologist: Sherren Kerns RVS  Examination Guidelines: A complete evaluation includes at minimum, Doppler waveform signals and systolic blood pressure reading at the level of bilateral brachial, anterior tibial, and posterior tibial arteries, when vessel segments are accessible. Bilateral testing is considered an integral part of a complete examination. Photoelectric Plethysmograph (PPG) waveforms and toe systolic pressure readings are included as required and additional duplex testing as needed. Limited examinations for reoccurring indications may be performed as noted.  ABI Findings: +---------+------------------+-----+-----------+---------------------+ Right    Rt Pressure (mmHg)IndexWaveform   Comment               +---------+------------------+-----+-----------+---------------------+ Brachial                                   patient is contracted +---------+------------------+-----+-----------+---------------------+ PTA      141               1.11 multiphasic                       +---------+------------------+-----+-----------+---------------------+ DP       142  1.12 multiphasic                      +---------+------------------+-----+-----------+---------------------+ Great Toe136               1.07                                  +---------+------------------+-----+-----------+---------------------+ +---------+------------------+-----+-----------+-----------+ Left     Lt Pressure (mmHg)IndexWaveform   Comment     +---------+------------------+-----+-----------+-----------+ Brachial 127                    triphasic              +---------+------------------+-----+-----------+-----------+ PTA      141               1.11 multiphasic            +---------+------------------+-----+-----------+-----------+ DP       139               1.09 multiphasic            +---------+------------------+-----+-----------+-----------+ Great Toe                                  amputations +---------+------------------+-----+-----------+-----------+ +-------+-----------+-----------+------------+------------+ ABI/TBIToday's ABIToday's TBIPrevious ABIPrevious TBI +-------+-----------+-----------+------------+------------+ Right  1.12       1.07                                +-------+-----------+-----------+------------+------------+ Left   1.11       amputations                         +-------+-----------+-----------+------------+------------+   Summary: Right: Resting right ankle-brachial index is within normal range. No evidence of significant right lower extremity arterial disease. The right toe-brachial index is normal. Left: Resting left ankle-brachial index is within normal range. No evidence of significant left lower extremity arterial disease. *See table(s) above for measurements and observations.     Preliminary    DG Foot Complete Left  Result Date: 06/01/2022 CLINICAL DATA:  Toe necrosis, evaluate for osteomyelitis. EXAM:  LEFT FOOT - COMPLETE 3+ VIEW COMPARISON:  None Available. FINDINGS: No evidence of acute fracture or dislocation. There has been amputation of the first digit and the head of the proximal phalanx of the second digit and mid to distal phalanges of the second digit. No periosteal elevation. Tiny erosions are noted along the amputation site of the proximal phalanx of the second digit. Soft tissue swelling is noted at the amputation site with a tiny focus of air. Degenerative changes are noted in the midfoot and hindfoot. Calcaneal spurring is noted. Vascular calcifications are present in the soft tissues. IMPRESSION: 1. Tiny erosions along the amputation site at the head of the proximal phalanx of the second digit, concerning for osteomyelitis. There is associated soft tissue swelling in this region with a tiny focus of air. 2. Amputation of the first digit, and mid and distal phalanx of the second digit. Electronically Signed   By: Thornell Sartorius M.D.   On: 06/01/2022 21:44    Review of Systems Blood pressure 123/69, pulse (!) 55, temperature 97.7 F (36.5 C), temperature source Oral, resp. rate 17, height 6' (1.829 m), weight 94.4 kg, SpO2 100 %. Physical  Exam General: AAO x3, NAD  Dermatological: Necrosis present of the distal aspect the left second toe.  There is currently no drainage or pus.  No fluctuance or crepitation.  There is some edema present to the foot with mild increased temperature.  Vascular: Dorsalis Pedis artery and Posterior Tibial artery pedal pulses are palpable bilateral with immedate capillary fill time.    Neruologic: Decree sensation.  Musculoskeletal: Prior amputation noted.    Assessment/Plan: Osteomyelitis left second toe  Given the necrosis of the tissue as well as evidence of likely abrasions of the second digit I went ahead and recommended to proceed with amputation the remainder of the digit.  We discussed debridement, biopsy is try to salvage the toe as well however  at this point only partial toe remains in good with the infection I think amputations the best option for him is remaining digit.  We discussed the surgery as well as postoperative course.  We are in a plan on doing surgery tomorrow.  Consent to be done, n.p.o. after 9 AM tomorrow for surgery likely around 5:30 Tuesday.  We discussed risks of surgery including spread of infection, delayed or nonhealing, bleeding, scarring as well as further amputation and general risk of surgery.  There is no family bedside.  I offered to contact his family but he declined. I told him to let me know if he wanted me to contact anyone.     Ovid Curd, DPM  Terry Barton 06/02/2022, 12:37 PM

## 2022-06-03 ENCOUNTER — Encounter (HOSPITAL_COMMUNITY): Payer: Self-pay | Admitting: Internal Medicine

## 2022-06-03 ENCOUNTER — Inpatient Hospital Stay (HOSPITAL_COMMUNITY): Payer: Medicare Other

## 2022-06-03 ENCOUNTER — Inpatient Hospital Stay (HOSPITAL_COMMUNITY): Payer: Medicare Other | Admitting: Anesthesiology

## 2022-06-03 ENCOUNTER — Encounter (HOSPITAL_COMMUNITY)
Admission: EM | Disposition: A | Discharge status: Home / Self Care | Payer: Self-pay | Source: Home / Self Care | Attending: Internal Medicine

## 2022-06-03 ENCOUNTER — Other Ambulatory Visit: Payer: Self-pay

## 2022-06-03 DIAGNOSIS — M86672 Other chronic osteomyelitis, left ankle and foot: Secondary | ICD-10-CM

## 2022-06-03 DIAGNOSIS — I6990 Unspecified sequelae of unspecified cerebrovascular disease: Secondary | ICD-10-CM

## 2022-06-03 DIAGNOSIS — E1169 Type 2 diabetes mellitus with other specified complication: Secondary | ICD-10-CM

## 2022-06-03 DIAGNOSIS — I10 Essential (primary) hypertension: Secondary | ICD-10-CM

## 2022-06-03 DIAGNOSIS — M869 Osteomyelitis, unspecified: Secondary | ICD-10-CM | POA: Diagnosis not present

## 2022-06-03 DIAGNOSIS — E782 Mixed hyperlipidemia: Secondary | ICD-10-CM | POA: Diagnosis not present

## 2022-06-03 DIAGNOSIS — N189 Chronic kidney disease, unspecified: Secondary | ICD-10-CM | POA: Diagnosis not present

## 2022-06-03 HISTORY — PX: AMPUTATION TOE: SHX6595

## 2022-06-03 LAB — BASIC METABOLIC PANEL
Anion gap: 6 (ref 5–15)
BUN: 26 mg/dL — ABNORMAL HIGH (ref 8–23)
CO2: 16 mmol/L — ABNORMAL LOW (ref 22–32)
Calcium: 8.6 mg/dL — ABNORMAL LOW (ref 8.9–10.3)
Chloride: 116 mmol/L — ABNORMAL HIGH (ref 98–111)
Creatinine, Ser: 1.74 mg/dL — ABNORMAL HIGH (ref 0.61–1.24)
GFR, Estimated: 42 mL/min — ABNORMAL LOW (ref >60)
Glucose, Bld: 97 mg/dL (ref 70–99)
Potassium: 4.2 mmol/L (ref 3.5–5.1)
Sodium: 138 mmol/L (ref 135–145)

## 2022-06-03 LAB — CBC
HCT: 31 % — ABNORMAL LOW (ref 39.0–52.0)
Hemoglobin: 9.8 g/dL — ABNORMAL LOW (ref 13.0–17.0)
MCH: 31.8 pg (ref 26.0–34.0)
MCHC: 31.6 g/dL (ref 30.0–36.0)
MCV: 100.6 fL — ABNORMAL HIGH (ref 80.0–100.0)
Platelets: 309 10*3/uL (ref 150–400)
RBC: 3.08 MIL/uL — ABNORMAL LOW (ref 4.22–5.81)
RDW: 12.5 % (ref 11.5–15.5)
WBC: 11.2 10*3/uL — ABNORMAL HIGH (ref 4.0–10.5)
nRBC: 0 % (ref 0.0–0.2)

## 2022-06-03 LAB — GLUCOSE, CAPILLARY: Glucose-Capillary: 75 mg/dL (ref 70–99)

## 2022-06-03 LAB — MAGNESIUM: Magnesium: 1.7 mg/dL (ref 1.7–2.4)

## 2022-06-03 SURGERY — AMPUTATION, TOE
Anesthesia: General | Site: Toe | Laterality: Left

## 2022-06-03 MED ORDER — EPHEDRINE 5 MG/ML INJ
INTRAVENOUS | Status: AC
  Filled 2022-06-03: qty 5

## 2022-06-03 MED ORDER — CHLORHEXIDINE GLUCONATE 0.12 % MT SOLN
OROMUCOSAL | Status: AC
  Administered 2022-06-03: 15 mL via OROMUCOSAL
  Filled 2022-06-03: qty 15

## 2022-06-03 MED ORDER — CHLORHEXIDINE GLUCONATE 0.12 % MT SOLN: 15.0000 mL | Freq: Once | OROMUCOSAL | Status: AC

## 2022-06-03 MED ORDER — SODIUM CHLORIDE 0.9 % IV SOLN
2.0000 g | Freq: Two times a day (BID) | INTRAVENOUS | Status: DC
  Administered 2022-06-03 – 2022-06-05 (×4): 2 g via INTRAVENOUS
  Filled 2022-06-03 (×3): qty 12.5

## 2022-06-03 MED ORDER — ONDANSETRON HCL 4 MG/2ML IJ SOLN
INTRAMUSCULAR | Status: AC
  Filled 2022-06-03: qty 2

## 2022-06-03 MED ORDER — EPHEDRINE SULFATE-NACL 50-0.9 MG/10ML-% IV SOSY
PREFILLED_SYRINGE | INTRAVENOUS | Status: DC | PRN
  Administered 2022-06-03 (×2): 5 mg via INTRAVENOUS

## 2022-06-03 MED ORDER — PROMETHAZINE HCL 25 MG/ML IJ SOLN: 6.2500 mg | INTRAMUSCULAR | Status: DC | PRN

## 2022-06-03 MED ORDER — GLYCOPYRROLATE PF 0.2 MG/ML IJ SOSY
PREFILLED_SYRINGE | INTRAMUSCULAR | Status: AC
  Filled 2022-06-03: qty 1

## 2022-06-03 MED ORDER — BUPIVACAINE HCL (PF) 0.5 % IJ SOLN
INTRAMUSCULAR | Status: AC
  Filled 2022-06-03: qty 30

## 2022-06-03 MED ORDER — LIDOCAINE HCL 2 % IJ SOLN
INTRAMUSCULAR | Status: AC
  Filled 2022-06-03: qty 20

## 2022-06-03 MED ORDER — ORAL CARE MOUTH RINSE
15.0000 mL | Freq: Once | OROMUCOSAL | Status: AC
Start: 2022-06-03 — End: 2022-06-03

## 2022-06-03 MED ORDER — LIDOCAINE 2% (20 MG/ML) 5 ML SYRINGE
INTRAMUSCULAR | Status: AC
  Filled 2022-06-03: qty 5

## 2022-06-03 MED ORDER — FENTANYL CITRATE (PF) 100 MCG/2ML IJ SOLN: 25.0000 ug | INTRAMUSCULAR | Status: DC | PRN

## 2022-06-03 MED ORDER — PROPOFOL 10 MG/ML IV BOLUS
INTRAVENOUS | Status: AC
  Filled 2022-06-03: qty 20

## 2022-06-03 MED ORDER — ONDANSETRON HCL 4 MG/2ML IJ SOLN
INTRAMUSCULAR | Status: DC | PRN
  Administered 2022-06-03: 4 mg via INTRAVENOUS

## 2022-06-03 MED ORDER — DEXAMETHASONE SODIUM PHOSPHATE 10 MG/ML IJ SOLN
INTRAMUSCULAR | Status: AC
  Filled 2022-06-03: qty 1

## 2022-06-03 MED ORDER — AMISULPRIDE (ANTIEMETIC) 5 MG/2ML IV SOLN
10.0000 mg | Freq: Once | INTRAVENOUS | Status: DC | PRN
Start: 2022-06-03 — End: 2022-06-03

## 2022-06-03 MED ORDER — LACTATED RINGERS IV SOLN: INTRAVENOUS | Status: DC

## 2022-06-03 MED ORDER — PHENYLEPHRINE 80 MCG/ML (10ML) SYRINGE FOR IV PUSH (FOR BLOOD PRESSURE SUPPORT)
PREFILLED_SYRINGE | INTRAVENOUS | Status: AC
  Filled 2022-06-03: qty 10

## 2022-06-03 MED ORDER — BUPIVACAINE HCL (PF) 0.5 % IJ SOLN
INTRAMUSCULAR | Status: DC | PRN
  Administered 2022-06-03: 20 mL

## 2022-06-03 MED ORDER — LIDOCAINE 2% (20 MG/ML) 5 ML SYRINGE
INTRAMUSCULAR | Status: DC | PRN
  Administered 2022-06-03: 100 mg via INTRAVENOUS

## 2022-06-03 MED ORDER — MIDAZOLAM HCL 2 MG/2ML IJ SOLN
INTRAMUSCULAR | Status: AC
  Filled 2022-06-03: qty 2

## 2022-06-03 MED ORDER — LIDOCAINE HCL 2 % IJ SOLN
INTRAMUSCULAR | Status: DC | PRN
  Administered 2022-06-03: 20 mL

## 2022-06-03 MED ORDER — GLYCOPYRROLATE PF 0.2 MG/ML IJ SOSY
PREFILLED_SYRINGE | INTRAMUSCULAR | Status: DC | PRN
  Administered 2022-06-03: .2 mg via INTRAVENOUS

## 2022-06-03 MED ORDER — 0.9 % SODIUM CHLORIDE (POUR BTL) OPTIME
TOPICAL | Status: DC | PRN
  Administered 2022-06-03: 1000 mL

## 2022-06-03 MED ORDER — DEXAMETHASONE SODIUM PHOSPHATE 10 MG/ML IJ SOLN
INTRAMUSCULAR | Status: DC | PRN
  Administered 2022-06-03: 10 mg via INTRAVENOUS

## 2022-06-03 MED ORDER — FENTANYL CITRATE (PF) 250 MCG/5ML IJ SOLN
INTRAMUSCULAR | Status: AC
  Filled 2022-06-03: qty 5

## 2022-06-03 MED ORDER — FENTANYL CITRATE (PF) 250 MCG/5ML IJ SOLN
INTRAMUSCULAR | Status: DC | PRN
  Administered 2022-06-03: 50 ug via INTRAVENOUS

## 2022-06-03 MED ORDER — PROPOFOL 10 MG/ML IV BOLUS
INTRAVENOUS | Status: DC | PRN
  Administered 2022-06-03: 100 mg via INTRAVENOUS

## 2022-06-03 MED ORDER — PHENYLEPHRINE 80 MCG/ML (10ML) SYRINGE FOR IV PUSH (FOR BLOOD PRESSURE SUPPORT)
PREFILLED_SYRINGE | INTRAVENOUS | Status: DC | PRN
  Administered 2022-06-03 (×2): 80 ug via INTRAVENOUS

## 2022-06-03 SURGICAL SUPPLY — 28 items
BAG COUNTER SPONGE SURGICOUNT (BAG) ×2 IMPLANT
BLADE LONG MED 31X9 (MISCELLANEOUS) ×1 IMPLANT
BNDG ELASTIC 4X5.8 VLCR NS LF (GAUZE/BANDAGES/DRESSINGS) ×1 IMPLANT
BNDG GAUZE DERMACEA FLUFF (GAUZE/BANDAGES/DRESSINGS) ×1
BNDG GAUZE DERMACEA FLUFF 4 (GAUZE/BANDAGES/DRESSINGS) IMPLANT
DRSG EMULSION OIL 3X3 NADH (GAUZE/BANDAGES/DRESSINGS) ×2 IMPLANT
DURAPREP 26ML APPLICATOR (WOUND CARE) ×2 IMPLANT
ELECT REM PT RETURN 9FT ADLT (ELECTROSURGICAL) ×2
ELECTRODE REM PT RTRN 9FT ADLT (ELECTROSURGICAL) ×1 IMPLANT
GAUZE SPONGE 4X4 12PLY STRL (GAUZE/BANDAGES/DRESSINGS) ×2 IMPLANT
GAUZE XEROFORM 1X8 LF (GAUZE/BANDAGES/DRESSINGS) ×1 IMPLANT
GLOVE BIO SURGEON STRL SZ8 (GLOVE) ×4 IMPLANT
GOWN STRL REUS W/ TWL LRG LVL3 (GOWN DISPOSABLE) ×1 IMPLANT
GOWN STRL REUS W/ TWL XL LVL3 (GOWN DISPOSABLE) ×1 IMPLANT
GOWN STRL REUS W/TWL LRG LVL3 (GOWN DISPOSABLE) ×1
GOWN STRL REUS W/TWL XL LVL3 (GOWN DISPOSABLE) ×1
KIT BASIN OR (CUSTOM PROCEDURE TRAY) ×2 IMPLANT
NDL HYPO 25X1 1.5 SAFETY (NEEDLE) ×1 IMPLANT
NEEDLE HYPO 25X1 1.5 SAFETY (NEEDLE) ×2 IMPLANT
NS IRRIG 1000ML POUR BTL (IV SOLUTION) ×1 IMPLANT
PACK ORTHO EXTREMITY (CUSTOM PROCEDURE TRAY) ×2 IMPLANT
SUCTION FRAZIER HANDLE 10FR (MISCELLANEOUS) ×1
SUCTION TUBE FRAZIER 10FR DISP (MISCELLANEOUS) ×1 IMPLANT
SUT ETHILON 3 0 FSL (SUTURE) ×1 IMPLANT
SYR CONTROL 10ML LL (SYRINGE) ×1 IMPLANT
TUBE CONNECTING 12X1/4 (SUCTIONS) ×2 IMPLANT
UNDERPAD 30X36 HEAVY ABSORB (UNDERPADS AND DIAPERS) ×2 IMPLANT
YANKAUER SUCT BULB TIP NO VENT (SUCTIONS) ×1 IMPLANT

## 2022-06-03 NOTE — Progress Notes (Addendum)
Cefepime (maxipime) IVPB was not given last night by night nurse Dickie La. Bryant,RN due to secondary line being clamped. Day shift RN noticed when giving morning meds. Charge nurse Aurea Graff brought to patient room to see that medication was still hanging and not given. Director, and pharmacy made aware. Per Phillips Climes from pharmacy I went in and edited that medication was not given due to secondary line being clamed. Dose was rescheduled and given at 1141.

## 2022-06-03 NOTE — Interval H&P Note (Signed)
History and Physical Interval Note:  06/03/2022 5:57 PM  Terry Barton  has presented today for surgery, with the diagnosis of osteomyeleitis.  The various methods of treatment have been discussed with the patient and family. After consideration of risks, benefits and other options for treatment, the patient has consented to  Procedure(s): AMPUTATION TOE (Left) as a surgical intervention.  The patient's history has been reviewed, patient examined, no change in status, stable for surgery.  I have reviewed the patient's chart and labs.  Questions were answered to the patient's satisfaction.     Vivi Barrack

## 2022-06-03 NOTE — Anesthesia Postprocedure Evaluation (Addendum)
Anesthesia Post Note  Patient: Terry Barton  Procedure(s) Performed: AMPUTATION TOE (Left: Toe)     Patient location during evaluation: PACU Anesthesia Type: MAC and General Level of consciousness: awake and alert Pain management: pain level controlled Vital Signs Assessment: post-procedure vital signs reviewed and stable Respiratory status: spontaneous breathing and respiratory function stable Cardiovascular status: stable Postop Assessment: no apparent nausea or vomiting Anesthetic complications: no   No notable events documented.  Last Vitals:  Vitals:   06/03/22 1950 06/03/22 2018  BP: (!) 104/54 (!) 110/53  Pulse: 72 73  Resp: 13 16  Temp: (!) 36.4 C (!) 36.4 C  SpO2: 96% 97%    Last Pain:  Vitals:   06/03/22 2018  TempSrc: Oral  PainSc:                  Esiquio Boesen DANIEL

## 2022-06-04 ENCOUNTER — Encounter (HOSPITAL_COMMUNITY): Payer: Self-pay | Admitting: Podiatry

## 2022-06-04 DIAGNOSIS — M869 Osteomyelitis, unspecified: Secondary | ICD-10-CM | POA: Diagnosis not present

## 2022-06-04 LAB — CBC
HCT: 38.7 % — ABNORMAL LOW (ref 39.0–52.0)
Hemoglobin: 11.5 g/dL — ABNORMAL LOW (ref 13.0–17.0)
MCH: 30.7 pg (ref 26.0–34.0)
MCHC: 29.7 g/dL — ABNORMAL LOW (ref 30.0–36.0)
MCV: 103.2 fL — ABNORMAL HIGH (ref 80.0–100.0)
Platelets: 308 10*3/uL (ref 150–400)
RBC: 3.75 MIL/uL — ABNORMAL LOW (ref 4.22–5.81)
RDW: 12.8 % (ref 11.5–15.5)
WBC: 11.2 10*3/uL — ABNORMAL HIGH (ref 4.0–10.5)
nRBC: 0 % (ref 0.0–0.2)

## 2022-06-04 LAB — BASIC METABOLIC PANEL
Anion gap: 9 (ref 5–15)
BUN: 26 mg/dL — ABNORMAL HIGH (ref 8–23)
CO2: 13 mmol/L — ABNORMAL LOW (ref 22–32)
Calcium: 8.9 mg/dL (ref 8.9–10.3)
Chloride: 116 mmol/L — ABNORMAL HIGH (ref 98–111)
Creatinine, Ser: 1.73 mg/dL — ABNORMAL HIGH (ref 0.61–1.24)
GFR, Estimated: 42 mL/min — ABNORMAL LOW (ref >60)
Glucose, Bld: 212 mg/dL — ABNORMAL HIGH (ref 70–99)
Potassium: 4.3 mmol/L (ref 3.5–5.1)
Sodium: 138 mmol/L (ref 135–145)

## 2022-06-04 LAB — MAGNESIUM: Magnesium: 1.6 mg/dL — ABNORMAL LOW (ref 1.7–2.4)

## 2022-06-04 MED ORDER — MAGNESIUM SULFATE 2 GM/50ML IV SOLN
2.0000 g | Freq: Once | INTRAVENOUS | Status: AC
  Administered 2022-06-04: 2 g via INTRAVENOUS
  Filled 2022-06-04: qty 50

## 2022-06-04 NOTE — Progress Notes (Signed)
Subjective: Terry Barton is a 66 y.o. POD #1 s/p left partial second ray amputation.  States he is doing well.  Denies any fevers or chills.  Son is at bedside today.  Objective: General: No acute distress, AAOx3  DP/PT pulses palpable  Dressing clean, dry, intact.  Incision well coapted with sutures intact.  Mild edema but there is no significant erythema, ascending cellulitis but there is no fluctuance or crepitation but there is no drainage or pus.  No malodor. No other open lesions or pre-ulcerative lesions b/l.  No pain with calf compression, swelling, warmth, erythema.      Assessment and Plan:  POD #1 s/p left partial second amputation  Overall doing well.  Dressing changed today.  Small amount of Betadine was applied followed by dry sterile dressing.  Dressing marking to transition to oral antibiotics, likely Augmentin for a week for any residual soft tissue infection.  Clinically intraoperative margins appear to be clean.  Podiatry standpoint he was discharged tomorrow with close follow-up and I will see him back in 7 to 10 days for follow-up.  Return precautions discussed.  His son was present at bedside and feels comfortable changing the dressing at home.  Ovid Curd, DPM

## 2022-06-04 NOTE — Hospital Course (Addendum)
66 year old male with past medical history of spinal cord injury, HTN, CVA-hemorrhagic, HLD, hypothyroidism, CKD 3A, chronic anemia, left foot first toe amputation presents to the hospital with complaints of second toe necrosis. Podiatry consulted Underwent left toe amputation on 6/27. Postoperatively, developed AKI on CKD requiring IV hydration and close observation.

## 2022-06-04 NOTE — TOC Initial Note (Signed)
Transition of Care [facility]) - Initial/Assessment Note    Patient Details  Name: Terry Barton MRN: [id number] Date of Birth: [birth year]  Transition of Care [facility]) CM/SW Contact:    Lawerance Sabal, RN Phone Number: 06/04/2022, 11:10 AM  Clinical Narrative:       Spoke w patient at bedside to discuss needs for discharge. 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   He states that he and son live together, son provides support.  Patient states that he has "just about everything" in terms of DME,  hoyer lift, hospital bed, motorized wheelchair, 3/1. He states that he has what he needs, and did not identify any additional DME needs for DC. He states that his son usually provides transportation for him with their transportation Zenaida Niece, and may be to provide transportation home from the hospital, it would just depend on the time. Discussed that there is no set DC time and if his son can get him after work or whenever is convenient on day of DC it may save him potential ambulance charges and would not necessarily delay his leaving as the ambulances get backed up. He appreciated that information.     Admitted with osteo to toes, requiring amputation of toes. No additional DME or HH needs identified at this time.            Expected Discharge Plan: Home/Self Care Barriers to Discharge: Continued Medical Work up   Patient Goals and CMS Choice Patient states their goals for this hospitalization and ongoing recovery are:: to return home      Expected Discharge Plan and Services Expected Discharge Plan: Home/Self Care   Discharge Planning Services: CM Consult Post Acute Care Choice: NA Living arrangements for the past 2 months: Single Family Home                                      Prior Living Arrangements/Services Living arrangements for the past 2 months: Single Family Home Lives with:: Adult Children               Current home services: DME    Activities of Daily Living      Permission Sought/Granted                  Emotional Assessment              Admission diagnosis:  Osteomyelitis (HCC) [M86.9] Toe necrosis (HCC) [I96] Osteomyelitis, unspecified site, unspecified type [facility]) [M86.9] Patient Active Problem List   Diagnosis Date Noted   Osteomyelitis (HCC) 06/01/2022   Hypothyroidism 06/01/2022   CKD (chronic kidney disease) 06/01/2022   Macrocytic anemia 03/30/2022   Moderate protein-calorie malnutrition (HCC) 03/30/2022   Metabolic acidosis, normal anion gap (NAG) 03/30/2022   Biliary sludge 03/30/2022   Gastroenteritis 03/30/2022   Essential hypertension 05/17/2020   Hyperlipidemia 05/17/2020   Acute renal failure superimposed on stage 3a chronic kidney disease (HCC) 05/17/2020   Hypokalemia 05/17/2020   ICH (intracerebral hemorrhage) (HCC) d/t HTN 05/12/2020   Diabetes mellitus (HCC) 10/18/2019   PCP:  Loura Back, NP Pharmacy:   [id number] - [place], [place] - [address] AT [facility] [address] & [address] [place] [address] Phone: [phone] Fax: [phone]  [facility] [address] [place]  [address] Phone: [phone] Fax: [phone]     Social Determinants of Health (SDOH) Interventions    Readmission Risk Interventions     No data to display

## 2022-06-04 NOTE — Plan of Care (Signed)

## 2022-06-04 NOTE — Progress Notes (Signed)
  Progress Note Patient: Terry Barton YTK:[id number] DOB: 1956/03/05 DOA: 06/01/2022  DOS: the patient was seen and examined on 06/04/2022  Brief hospital course: 66 year old male with past medical history of spinal cord injury, HTN, CVA-hemorrhagic, HLD, hypothyroidism, CKD 3A, chronic anemia, left foot first toe amputation presents to the hospital with complaints of second toe necrosis. Podiatry consulted Amputation of transmetatarsal area. Assessment and Plan: Necrosis and osteomyelitis left foot second toe -Currently on empiric IV vancomycin and cefepime/Flagyl - ABIs-normal - SP transmetatarsal amputation by podiatry.  Management for pain and wound per podiatry. - Pain control   Sinus bradycardia -Metoprolol on hold   Diet controlled type 2 diabetes -Diet controlled   Hypertension -Continue amlodipine.  Metoprolol on hold due to bradycardia.  IV as needed ordered   Hyperlipidemia -Continue Lipitor   Hypothyroidism -On Synthroid   CKD stage IIIa Chronic normal anion gap metabolic acidosis -Creatinine stable around 1.7   Chronic anemia -Hemoglobin stable at 10.0   Depression/insomnia -Continue mirtazapine, trazodone prn  Subjective: Pain well controlled.  No nausea no vomiting no fever no chills.  Physical Exam: Vitals:   06/03/22 2018 06/04/22 0847 06/04/22 0851 06/04/22 1607  BP: (!) 110/53  132/63 109/61  Pulse: 73 74 72 64  Resp: 16  17 17   Temp: (!) 97.5 F (36.4 C)  (!) 97.5 F (36.4 C) (!) 97.3 F (36.3 C)  TempSrc: Oral  Oral Oral  SpO2: 97% 100% 100% 100%  Weight:      Height:       General: Appear in mild distress; no visible Abnormal Neck Mass Or lumps, Conjunctiva normal Cardiovascular: S1 and S2 Present, no Murmur, Respiratory: good respiratory effort, Bilateral Air entry present and CTA, no Crackles, no wheezes Abdomen: Bowel Sound present, Non tender  Extremities: no right leg edema, positive swelling of the left leg Neurology: alert and  oriented to time, place, and person  Gait not checked due to patient safety concerns   Data Reviewed: I have Reviewed nursing notes, Vitals, and Lab results since pt's last encounter. Pertinent lab results CBC and BMP I have ordered test including CBC and BMP    Family Communication: None at bedside  Disposition: Status is: Inpatient Remains inpatient appropriate because: Continue IV antibiotics and monitor recommendation for podiatry  Author: , MD 06/04/2022 7:24 PM  Please look on [email or web address] to find out who is on call.

## 2022-06-05 DIAGNOSIS — M869 Osteomyelitis, unspecified: Secondary | ICD-10-CM | POA: Diagnosis not present

## 2022-06-05 LAB — CBC
HCT: 26.9 % — ABNORMAL LOW (ref 39.0–52.0)
HCT: 29 % — ABNORMAL LOW (ref 39.0–52.0)
Hemoglobin: 8.6 g/dL — ABNORMAL LOW (ref 13.0–17.0)
Hemoglobin: 9.2 g/dL — ABNORMAL LOW (ref 13.0–17.0)
MCH: 31.6 pg (ref 26.0–34.0)
MCH: 31 pg (ref 26.0–34.0)
MCHC: 32 g/dL (ref 30.0–36.0)
MCHC: 31.7 g/dL (ref 30.0–36.0)
MCV: 98.9 fL (ref 80.0–100.0)
MCV: 97.6 fL (ref 80.0–100.0)
Platelets: 299 10*3/uL (ref 150–400)
Platelets: 223 10*3/uL (ref 150–400)
RBC: 2.72 MIL/uL — ABNORMAL LOW (ref 4.22–5.81)
RBC: 2.97 MIL/uL — ABNORMAL LOW (ref 4.22–5.81)
RDW: 12.8 % (ref 11.5–15.5)
RDW: 13 % (ref 11.5–15.5)
WBC: 12.2 10*3/uL — ABNORMAL HIGH (ref 4.0–10.5)
WBC: 13 10*3/uL — ABNORMAL HIGH (ref 4.0–10.5)
nRBC: 0.2 % (ref 0.0–0.2)
nRBC: 0 % (ref 0.0–0.2)

## 2022-06-05 LAB — RENAL FUNCTION PANEL
Albumin: 2.6 g/dL — ABNORMAL LOW (ref 3.5–5.0)
Anion gap: 11 (ref 5–15)
BUN: 36 mg/dL — ABNORMAL HIGH (ref 8–23)
CO2: 15 mmol/L — ABNORMAL LOW (ref 22–32)
Calcium: 8.9 mg/dL (ref 8.9–10.3)
Chloride: 111 mmol/L (ref 98–111)
Creatinine, Ser: 2.2 mg/dL — ABNORMAL HIGH (ref 0.61–1.24)
GFR, Estimated: 32 mL/min — ABNORMAL LOW (ref >60)
Glucose, Bld: 117 mg/dL — ABNORMAL HIGH (ref 70–99)
Phosphorus: 2.2 mg/dL — ABNORMAL LOW (ref 2.5–4.6)
Potassium: 4.1 mmol/L (ref 3.5–5.1)
Sodium: 137 mmol/L (ref 135–145)

## 2022-06-05 LAB — BASIC METABOLIC PANEL
Anion gap: 9 (ref 5–15)
BUN: 34 mg/dL — ABNORMAL HIGH (ref 8–23)
CO2: 16 mmol/L — ABNORMAL LOW (ref 22–32)
Calcium: 8.7 mg/dL — ABNORMAL LOW (ref 8.9–10.3)
Chloride: 113 mmol/L — ABNORMAL HIGH (ref 98–111)
Creatinine, Ser: 1.96 mg/dL — ABNORMAL HIGH (ref 0.61–1.24)
GFR, Estimated: 37 mL/min — ABNORMAL LOW (ref >60)
Glucose, Bld: 155 mg/dL — ABNORMAL HIGH (ref 70–99)
Potassium: 4.3 mmol/L (ref 3.5–5.1)
Sodium: 138 mmol/L (ref 135–145)

## 2022-06-05 LAB — SURGICAL PATHOLOGY

## 2022-06-05 LAB — MAGNESIUM: Magnesium: 2 mg/dL (ref 1.7–2.4)

## 2022-06-05 MED ORDER — LACTATED RINGERS IV SOLN: INTRAVENOUS | Status: DC

## 2022-06-05 MED ORDER — AMOXICILLIN-POT CLAVULANATE 500-125 MG PO TABS
1.0000 | ORAL_TABLET | Freq: Three times a day (TID) | ORAL | Status: DC
Start: 2022-06-05 — End: 2022-06-07
  Administered 2022-06-05 – 2022-06-07 (×7): 500 mg via ORAL
  Filled 2022-06-05 (×7): qty 1

## 2022-06-05 NOTE — Care Management Important Message (Signed)
Important Message  Patient Details  Name: Terry Barton MRN: [id number] Date of Birth: 1956-04-19   Medicare Important Message Given:  Yes     Dorena Bodo 06/05/2022, 2:49 PM

## 2022-06-05 NOTE — Progress Notes (Signed)
  Progress Note Patient: Terry Barton CZY:[id number] DOB: 1956/08/27 DOA: 06/01/2022  DOS: the patient was seen and examined on 06/05/2022  Brief hospital course: 66 year old male with past medical history of spinal cord injury, HTN, CVA-hemorrhagic, HLD, hypothyroidism, CKD 3A, chronic anemia, left foot first toe amputation presents to the hospital with complaints of second toe necrosis. Podiatry consulted Amputation of transmetatarsal area. Assessment and Plan: Necrosis and osteomyelitis left foot second toe Was on empiric IV vancomycin and cefepime/Flagyl - ABIs-normal - SP transmetatarsal amputation by podiatry.  Management for pain and wound per podiatry. - Pain control we will switch to oral antibiotics.   Sinus bradycardia -Metoprolol on hold   Diet controlled type 2 diabetes -Diet controlled   Hypertension -Continue amlodipine.  Metoprolol on hold due to bradycardia.  IV as needed ordered   Hyperlipidemia -Continue Lipitor   Hypothyroidism -On Synthroid   AKI on CKD stage IIIa Chronic normal anion gap metabolic acidosis -Creatinine stable around 1.7 at baseline. Now worsening at 2.2.  Will provide IV fluid for most likely from poor p.o. intake postoperatively.   Chronic anemia -Hemoglobin stable at 10.0   Depression/insomnia -Continue mirtazapine, trazodone prn  Subjective: Pain remains well controlled.  No nausea or vomiting.  Physical Exam: Vitals:   06/04/22 1607 06/04/22 2037 06/05/22 0608 06/05/22 0837  BP: 109/61 130/67 (!) 144/70 (!) 144/82  Pulse: 64 70 64 67  Resp: 17 18 17 17   Temp: (!) 97.3 F (36.3 C) 98.2 F (36.8 C) 97.9 F (36.6 C) 98 F (36.7 C)  TempSrc: Oral Oral Oral Oral  SpO2: 100% 100% 100% 100%  Weight:      Height:       General: Appear in mild distress, no Rash; Oral Mucosa Clear, moist. no Abnormal Neck Mass Or lumps, Conjunctiva normal  Cardiovascular: S1 and S2 Present, no Murmur, Respiratory: good respiratory effort,  Bilateral Air entry present and CTA, no Crackles, no wheezes Abdomen: Bowel Sound present, Soft and no tenderness Extremities: no Pedal edema, SP left TMA with mild swelling Neurology: alert and oriented to time, place, and person affect appropriate. no new focal deficit Gait not checked due to patient safety concerns   Data Reviewed: I have Reviewed nursing notes, Vitals, and Lab results since pt's last encounter. Pertinent lab results CBC and BMP I have ordered test including CBC and BMP    Family Communication: None at bedside  Disposition: Status is: Inpatient Remains inpatient appropriate because: Renal function worsening.  Receiving IV hydration.  Monitor.  Author: , MD 06/05/2022 8:23 PM  Please look on [email or web address] to find out who is on call.

## 2022-06-05 NOTE — Op Note (Addendum)
PATIENT:  [name]  66 y.o. male   PRE-OPERATIVE DIAGNOSIS:  osteomyeleitis   POST-OPERATIVE DIAGNOSIS:  osteomyeleitis   PROCEDURE:  Procedure(s): AMPUTATION TOE (Left)   SURGEON:  Surgeon(s) and Role:    * Vivi Barrack, DPM - Primary   PHYSICIAN ASSISTANT:    ASSISTANTS: none    ANESTHESIA:   general   EBL:  19ml    BLOOD ADMINISTERED:none   DRAINS: none    LOCAL MEDICATIONS USED:  OTHER 6ml lidocaine and marcaine plain   SPECIMEN:  Source of Specimen:  bone for pathology   DISPOSITION OF SPECIMEN:  PATHOLOGY   COUNTS:  YES   TOURNIQUET:  * No tourniquets in log *   DICTATION: .Dragon Dictation   PLAN OF CARE: Admit to inpatient    PATIENT DISPOSITION:  PACU - hemodynamically stable.   Delay start of Pharmacological VTE agent (>24hrs) due to surgical blood loss or risk of bleeding: no  Indications for surgery: 66 year old male presented to the hospital with necrosis of his left second toe.  X-rays were concerning for osteomyelitis.  We discussed surgery as well as conservative care.  He agrees to proceed with surgery for second amputation.  Alternatives, risks, complications were discussed.  No promises or guarantees were given infection of the procedure and all questions were answered to the best my ability.  Procedure in detail: The patient was both verbally and visually identified by myself, nursing staff, the anesthesia staff preoperatively.  He was then transferred to the operating room via stretcher where the surgery took place.  LMA was placed.  The left lower extremities and scrubbed, prepped, draped in normal sterile fashion.  Timeout was performed.  A modified fishmouth incision was planned along the remaining aspect of the second digit on the left foot.  Incision was made from skin to bone with a #15 with scalpel.  At this time the toe was disarticulated and passed off the table.  There is no proximal tracking of purulence however the metatarsal  head did appear to be somewhat dark in color and soft and along the metatarsal head.  Due to this I would ahead and excise the metatarsal head.  Sagittal saw was utilized to do so.  This was passed off the table and sent to pathology.  The remaining bone appeared to be viable.  It was hard in nature, white in color.  No purulence.  I debrided the wound.  Hemostasis achieved and the wound was copiously irrigated with saline.  The wound was then closed with 3-0 nylon in a simple interrupted fashion.  Wound was dressed with Xeroform, 4 x 4's, Kerlix, Ace bandage.  He was woke up anesthesia and found to tolerate the procedure well any complications.  Transferred to PACU vital signs stable and vascular status intact.

## 2022-06-05 NOTE — Plan of Care (Signed)

## 2022-06-06 ENCOUNTER — Inpatient Hospital Stay (HOSPITAL_COMMUNITY): Payer: Medicare Other

## 2022-06-06 DIAGNOSIS — M869 Osteomyelitis, unspecified: Secondary | ICD-10-CM | POA: Diagnosis not present

## 2022-06-06 LAB — CBC
HCT: 28.4 % — ABNORMAL LOW (ref 39.0–52.0)
Hemoglobin: 8.8 g/dL — ABNORMAL LOW (ref 13.0–17.0)
MCH: 30.9 pg (ref 26.0–34.0)
MCHC: 31 g/dL (ref 30.0–36.0)
MCV: 99.6 fL (ref 80.0–100.0)
Platelets: 283 10*3/uL (ref 150–400)
RBC: 2.85 MIL/uL — ABNORMAL LOW (ref 4.22–5.81)
RDW: 13.2 % (ref 11.5–15.5)
WBC: 11.1 10*3/uL — ABNORMAL HIGH (ref 4.0–10.5)
nRBC: 0 % (ref 0.0–0.2)

## 2022-06-06 LAB — BASIC METABOLIC PANEL
Anion gap: 7 (ref 5–15)
BUN: 37 mg/dL — ABNORMAL HIGH (ref 8–23)
CO2: 16 mmol/L — ABNORMAL LOW (ref 22–32)
Calcium: 8.7 mg/dL — ABNORMAL LOW (ref 8.9–10.3)
Chloride: 113 mmol/L — ABNORMAL HIGH (ref 98–111)
Creatinine, Ser: 2.31 mg/dL — ABNORMAL HIGH (ref 0.61–1.24)
GFR, Estimated: 30 mL/min — ABNORMAL LOW (ref >60)
Glucose, Bld: 117 mg/dL — ABNORMAL HIGH (ref 70–99)
Potassium: 4.2 mmol/L (ref 3.5–5.1)
Sodium: 136 mmol/L (ref 135–145)

## 2022-06-06 LAB — MAGNESIUM: Magnesium: 1.7 mg/dL (ref 1.7–2.4)

## 2022-06-06 MED ORDER — SODIUM BICARBONATE 650 MG PO TABS
1300.0000 mg | ORAL_TABLET | Freq: Three times a day (TID) | ORAL | Status: DC
  Administered 2022-06-06 – 2022-06-08 (×7): 1300 mg via ORAL
  Filled 2022-06-06 (×7): qty 2

## 2022-06-06 NOTE — Progress Notes (Signed)
Subjective: Terry Barton is a 66 y.o. POD #2 s/p left partial second ray amputation.  States he is doing well.  No significant pain.  No fevers or chills.  No concerns. Objective: General: No acute distress, AAOx3  DP/PT pulses palpable  Dressing clean, dry, intact.  Incision well coapted with sutures intact.  There is any well any signs of dehiscence.  No drainage or pus.  No significant cellulitis present.  No fluctuance or crepitation. No pain with calf compression, swelling, warmth, erythema.    Assessment and Plan:  POD #2 s/p left partial second amputation  Overall doing well.  Dressing changed today.  Betadine was applied to incision followed by dry sterile dressing.  Continue elevation.  Continue Augmentin.  Patient is able to be discharged from podiatry standpoint when medically stable.  If still in the hospital on Sunday we will plan on changing the dressing then.  Ovid Curd, DPM

## 2022-06-06 NOTE — Plan of Care (Signed)
  Problem: Pain Managment: Goal: General experience of comfort will improve Outcome: Not Progressing   Problem: Safety: Goal: Ability to remain free from injury will improve Outcome: Not Progressing   Problem: Skin Integrity: Goal: Risk for impaired skin integrity will decrease Outcome: Not Progressing   Problem: Clinical Measurements: Goal: Will remain free from infection Outcome: Not Progressing   

## 2022-06-06 NOTE — Plan of Care (Signed)
  Problem: Education: Goal: Knowledge of General Education information will improve Description Including pain rating scale, medication(s)/side effects and non-pharmacologic comfort measures Outcome: Progressing   Problem: Health Behavior/Discharge Planning: Goal: Ability to manage health-related needs will improve Outcome: Progressing   

## 2022-06-06 NOTE — Progress Notes (Signed)
  Progress Note Patient: Savon Bordonaro OFB:[id number] DOB: 1956/07/18 DOA: 06/01/2022  DOS: the patient was seen and examined on 06/06/2022  Brief hospital course: 66 year old male with past medical history of spinal cord injury, HTN, CVA-hemorrhagic, HLD, hypothyroidism, CKD 3A, chronic anemia, left foot first toe amputation presents to the hospital with complaints of second toe necrosis. Podiatry consulted Amputation of transmetatarsal area.  Assessment and Plan: Necrosis and osteomyelitis left foot second toe Was on empiric IV vancomycin and cefepime/Flagyl - ABIs-normal - SP transmetatarsal amputation by podiatry.  Management for pain and wound per podiatry. - Pain control we will switch to oral antibiotics.   Sinus bradycardia -Metoprolol on hold   Diet controlled type 2 diabetes -Diet controlled   Hypertension -Continue amlodipine.  Metoprolol on hold due to bradycardia.  IV as needed ordered   Hyperlipidemia -Continue Lipitor   Hypothyroidism -On Synthroid   AKI on CKD stage IIIa Chronic normal anion gap metabolic acidosis -Creatinine stable around 1.7 at baseline. Now worsening at 2.3 despite IV fluids. For now we will continue with IV fluids.  Ultrasound renal negative for hydronephrosis.  If does not improve by tomorrow we will consult nephrology.   Chronic anemia -Hemoglobin stable at 10.0   Depression/insomnia -Continue mirtazapine, trazodone prn  Subjective: Denies any pain.  No nausea no vomiting.  No fever no chills.  No diarrhea.  Somewhat constipated actually.  Physical Exam: Vitals:   06/05/22 2039 06/06/22 0325 06/06/22 0800 06/06/22 1709  BP: 140/70 (!) 146/58 (!) 146/64 126/61  Pulse: 66 70 66 82  Resp: 18 18 18 17   Temp: 98.1 F (36.7 C) 98.4 F (36.9 C) 97.6 F (36.4 C) 98.2 F (36.8 C)  TempSrc: Oral Oral Oral Oral  SpO2: 100% 100% 100% 100%  Weight:      Height:       General: Appear in mild distress, no Rash; Oral Mucosa Clear, moist.  no Abnormal Neck Mass Or lumps, Conjunctiva normal  Cardiovascular: S1 and S2 Present, no Murmur, Respiratory: good respiratory effort, Bilateral Air entry present and CTA, no Crackles, no wheezes Abdomen: Bowel Sound present, Soft and no tenderness Extremities: Left-sided trace pedal edema, left-sided transmetatarsal potation Neurology: alert and oriented to time, place, and person affect appropriate. no new focal deficit Gait not checked due to patient safety concerns   Data Reviewed: I have Reviewed nursing notes, Vitals, and Lab results since pt's last encounter. Pertinent lab results CBC and BMP I have ordered test including CBC and BMP Ordered ultrasound renal  Family Communication: Discussed with son on phone.  Disposition: Status is: Inpatient Remains inpatient appropriate because: Renal function continues to worsen further. Ultrasound renal negative for May require nephrology input.  Author: June, MD 06/06/2022 6:24 PM  Please look on [email or web address] to find out who is on call.

## 2022-06-07 DIAGNOSIS — M869 Osteomyelitis, unspecified: Secondary | ICD-10-CM | POA: Diagnosis not present

## 2022-06-07 LAB — BASIC METABOLIC PANEL
Anion gap: 6 (ref 5–15)
BUN: 37 mg/dL — ABNORMAL HIGH (ref 8–23)
CO2: 20 mmol/L — ABNORMAL LOW (ref 22–32)
Calcium: 8.6 mg/dL — ABNORMAL LOW (ref 8.9–10.3)
Chloride: 113 mmol/L — ABNORMAL HIGH (ref 98–111)
Creatinine, Ser: 2.35 mg/dL — ABNORMAL HIGH (ref 0.61–1.24)
GFR, Estimated: 29 mL/min — ABNORMAL LOW (ref >60)
Glucose, Bld: 113 mg/dL — ABNORMAL HIGH (ref 70–99)
Potassium: 4.2 mmol/L (ref 3.5–5.1)
Sodium: 139 mmol/L (ref 135–145)

## 2022-06-07 LAB — CBC
HCT: 27.2 % — ABNORMAL LOW (ref 39.0–52.0)
Hemoglobin: 8.7 g/dL — ABNORMAL LOW (ref 13.0–17.0)
MCH: 31.4 pg (ref 26.0–34.0)
MCHC: 32 g/dL (ref 30.0–36.0)
MCV: 98.2 fL (ref 80.0–100.0)
Platelets: 281 10*3/uL (ref 150–400)
RBC: 2.77 MIL/uL — ABNORMAL LOW (ref 4.22–5.81)
RDW: 13.2 % (ref 11.5–15.5)
WBC: 10.1 10*3/uL (ref 4.0–10.5)
nRBC: 0.2 % (ref 0.0–0.2)

## 2022-06-07 LAB — MAGNESIUM: Magnesium: 1.6 mg/dL — ABNORMAL LOW (ref 1.7–2.4)

## 2022-06-07 MED ORDER — AMOXICILLIN-POT CLAVULANATE 500-125 MG PO TABS
1.0000 | ORAL_TABLET | Freq: Two times a day (BID) | ORAL | Status: DC
  Administered 2022-06-07 – 2022-06-08 (×2): 500 mg via ORAL
  Filled 2022-06-07 (×3): qty 1

## 2022-06-07 MED ORDER — MAGNESIUM SULFATE 2 GM/50ML IV SOLN
2.0000 g | Freq: Once | INTRAVENOUS | Status: AC
Start: 2022-06-07 — End: 2022-06-08
  Administered 2022-06-07: 2 g via INTRAVENOUS
  Filled 2022-06-07: qty 50

## 2022-06-07 NOTE — Progress Notes (Signed)
  Progress Note Patient: Terry Barton WYO:[id number] DOB: Jul 10, 1956 DOA: 06/01/2022  DOS: the patient was seen and examined on 06/07/2022  Brief hospital course: 66 year old male with past medical history of spinal cord injury, HTN, CVA-hemorrhagic, HLD, hypothyroidism, CKD 3A, chronic anemia, left foot first toe amputation presents to the hospital with complaints of second toe necrosis. Podiatry consulted Underwent left toe amputation on 6/27. Postoperatively, developed AKI on CKD requiring IV hydration and close observation.6 Assessment and Plan: Necrosis and osteomyelitis left foot second toe Prior history of left foot great toe amputation and partial amputation of the left second toe. Now has necrosis of the remainder of the left second toe. Podiatry was consulted. Underwent amputation of the left second toe on 6/27. Initially treated with empiric IV vancomycin and cefepime and Flagyl. Currently on oral Augmentin.  AKI on CKD stage IIIa Chronic normal anion gap metabolic acidosis Baseline creatinine around 1.7.  Now worsening at 2.3.  After IV fluids currently have plateaued.  Urine output 2.9 L in last 24 hours. Possibility of postop ATN cannot be ruled out. Ultrasound renal negative for hydronephrosis. We will stop the fluid and monitor renal function.   Sinus bradycardia Metoprolol on hold   Diet controlled type 2 diabetes with peripheral vascular disease without any insulin use. Diet controlled Continue carb modified diet.  Not on sliding scale insulin.   Hypertension Blood pressure soft. Currently holding medication  Hyperlipidemia -Continue Lipitor   Hypothyroidism -On Synthroid    Chronic anemia -Hemoglobin stable at 10.0   Depression/insomnia -Continue mirtazapine, trazodone prn  Subjective: Pain well controlled.  No nausea no vomiting no fever no chills.  Breathing okay.  No chest pain.  No abdominal pain.  Physical Exam: Vitals:   06/06/22 2133 06/07/22  0549 06/07/22 0820 06/07/22 1549  BP: (!) 150/94 128/62 (!) 164/70 (!) 159/68  Pulse: 78 66 68 72  Resp: 16 18 18 20   Temp: 98.2 F (36.8 C) 98.3 F (36.8 C) 97.7 F (36.5 C) 98.1 F (36.7 C)  TempSrc: Oral Oral Oral Oral  SpO2: 99% 100% 100% 100%  Weight:      Height:       General: Appear in mild distress; no visible Abnormal Neck Mass Or lumps, Conjunctiva normal Cardiovascular: S1 and S2 Present, no Murmur, Respiratory: good respiratory effort, Bilateral Air entry present and CTA, no Crackles, no wheezes Abdomen: Bowel Sound present, Non tender Extremities: trace Pedal edema Neurology: alert and oriented to time, place, and person  Gait not checked due to patient safety concerns   Data Reviewed: I have Reviewed nursing notes, Vitals, and Lab results since pt's last encounter. Pertinent lab results renal function panel, magnesium I have ordered test including renal function panel, magnesium I have reviewed the last note from podiatry,    Family Communication: Son at bedside  Disposition: Status is: Inpatient Remains inpatient appropriate because: Renal function still elevated from baseline.  Anticipating improvement in 24 hours.  Author: , MD 06/07/2022 5:05 PM  Please look on [email or web address] to find out who is on call.

## 2022-06-08 ENCOUNTER — Telehealth: Payer: Self-pay | Admitting: Podiatry

## 2022-06-08 LAB — CBC
HCT: 28.7 % — ABNORMAL LOW (ref 39.0–52.0)
Hemoglobin: 9 g/dL — ABNORMAL LOW (ref 13.0–17.0)
MCH: 31.4 pg (ref 26.0–34.0)
MCHC: 31.4 g/dL (ref 30.0–36.0)
MCV: 100 fL (ref 80.0–100.0)
Platelets: 267 10*3/uL (ref 150–400)
RBC: 2.87 MIL/uL — ABNORMAL LOW (ref 4.22–5.81)
RDW: 13.3 % (ref 11.5–15.5)
WBC: 10.2 10*3/uL (ref 4.0–10.5)
nRBC: 0 % (ref 0.0–0.2)

## 2022-06-08 LAB — BASIC METABOLIC PANEL
Anion gap: 7 (ref 5–15)
BUN: 41 mg/dL — ABNORMAL HIGH (ref 8–23)
CO2: 20 mmol/L — ABNORMAL LOW (ref 22–32)
Calcium: 8.7 mg/dL — ABNORMAL LOW (ref 8.9–10.3)
Chloride: 111 mmol/L (ref 98–111)
Creatinine, Ser: 2.48 mg/dL — ABNORMAL HIGH (ref 0.61–1.24)
GFR, Estimated: 28 mL/min — ABNORMAL LOW (ref >60)
Glucose, Bld: 121 mg/dL — ABNORMAL HIGH (ref 70–99)
Potassium: 3.9 mmol/L (ref 3.5–5.1)
Sodium: 138 mmol/L (ref 135–145)

## 2022-06-08 LAB — MAGNESIUM: Magnesium: 1.9 mg/dL (ref 1.7–2.4)

## 2022-06-08 MED ORDER — SODIUM BICARBONATE 650 MG PO TABS: 1300.0000 mg | ORAL_TABLET | Freq: Three times a day (TID) | ORAL | 0 refills | Status: AC

## 2022-06-08 MED ORDER — DOCUSATE SODIUM 100 MG PO CAPS: 100.0000 mg | ORAL_CAPSULE | Freq: Two times a day (BID) | ORAL | 2 refills | Status: DC

## 2022-06-08 MED ORDER — AMOXICILLIN-POT CLAVULANATE 500-125 MG PO TABS: 1.0000 | ORAL_TABLET | Freq: Two times a day (BID) | ORAL | 0 refills | Status: AC

## 2022-06-08 MED ORDER — POLYETHYLENE GLYCOL 3350 17 G PO PACK: 17.0000 g | PACK | Freq: Every day | ORAL | 0 refills | Status: DC

## 2022-06-08 MED ORDER — NEPRO PO LIQD: 1.0000 | Freq: Two times a day (BID) | ORAL | 0 refills | Status: DC

## 2022-06-08 MED ORDER — AMLODIPINE BESYLATE 5 MG PO TABS
5.0000 mg | ORAL_TABLET | Freq: Every day | ORAL | 0 refills | Status: DC
Start: 2022-06-08 — End: 2023-04-11

## 2022-06-08 MED ORDER — OXYCODONE HCL 5 MG PO TABS
5.0000 mg | ORAL_TABLET | Freq: Three times a day (TID) | ORAL | 0 refills | Status: DC | PRN
Start: 2022-06-08 — End: 2023-05-08

## 2022-06-08 NOTE — Telephone Encounter (Signed)
Patient underwent surgery and was discharged from the hospital. He will need a follow up in 1-2 weeks. Thanks.

## 2022-06-08 NOTE — Discharge Summary (Signed)
Physician Discharge Summary   Patient: Terry Barton MRN: [id number] DOB: [birth year]  Admit date:     06/01/2022  Discharge date: {dischdate:26783}  Discharge Physician: Lynden Oxford  PCP: Loura Back, NP  Recommendations at discharge:  ***  Discharge Diagnoses: Principal Problem:   Osteomyelitis [facility]) Active Problems:   Essential hypertension   Hyperlipidemia   Hypothyroidism   CKD (chronic kidney disease)   Hospital Course: 66 year old male with past medical history of spinal cord injury, HTN, CVA-hemorrhagic, HLD, hypothyroidism, CKD 3A, chronic anemia, left foot first toe amputation presents to the hospital with complaints of second toe necrosis. Podiatry consulted Underwent left toe amputation on 6/27. Postoperatively, developed AKI on CKD requiring IV hydration and close observation.  Assessment and Plan: No notes have been filed under this hospital service. Service: Hospitalist     {Tip this will not be part of the note when signed Body mass index is 28.23 kg/m. , ,  Active Pressure Injury/Wound(s)     Pressure Ulcer  Duration          Pressure Injury 03/30/22 Sacrum Medial Unstageable - Full thickness tissue loss in which the base of the injury is covered by slough (yellow, tan, gray, green or brown) and/or eschar (tan, brown or black) in the wound bed. 70 days           (Optional):26781}  {(NOTE) Pain control PDMP Statment (Optional):26782}  Consultants: *** Procedures performed:  *** DISCHARGE MEDICATION: Allergies as of 06/08/2022   No Known Allergies      Medication List     STOP taking these medications    Baclofen 5 MG Tabs   HYDROcodone-acetaminophen 5-325 MG tablet Commonly known as: NORCO/VICODIN   metoprolol tartrate 50 MG tablet Commonly known as: LOPRESSOR   potassium chloride SA 20 MEQ tablet Commonly known as: KLOR-CON M       TAKE these medications    amLODipine 5 MG tablet Commonly known as: NORVASC Take 1 tablet  (5 mg total) by mouth daily. What changed:  medication strength how much to take   amoxicillin-clavulanate 500-125 MG tablet Commonly known as: AUGMENTIN Take 1 tablet (500 mg total) by mouth every 12 (twelve) hours for 7 days.   atorvastatin 20 MG tablet Commonly known as: LIPITOR Take 20 mg by mouth at bedtime.   CERTAVITE/ANTIOXIDANTS PO Take 1 tablet by mouth daily.   docusate sodium 100 MG capsule Commonly known as: Colace Take 1 capsule (100 mg total) by mouth 2 (two) times daily.   levothyroxine 50 MCG tablet Commonly known as: SYNTHROID Take 50 mcg by mouth.   mirtazapine 7.5 MG tablet Commonly known as: REMERON Take 7.5 mg by mouth at bedtime.   Nepro Liqd Take 1 each by mouth in the morning and at bedtime.   oxyCODONE 5 MG immediate release tablet Commonly known as: Oxy IR/ROXICODONE Take 1 tablet (5 mg total) by mouth 3 (three) times daily as needed for moderate pain or severe pain.   polyethylene glycol 17 g packet Commonly known as: MiraLax Take 17 g by mouth daily.   sodium bicarbonate 650 MG tablet Take 2 tablets (1,300 mg total) by mouth 3 (three) times daily. What changed: how much to take   traZODone 50 MG tablet Commonly known as: DESYREL Take 1 tablet (50 mg total) by mouth at bedtime as needed for sleep.   Vitamin D3 50 MCG (2000 UT) Tabs Take 2,000 Units by mouth daily.  Discharge Care Instructions  (From admission, onward)           Start     Ordered   06/08/22 0000  Discharge wound care:       Comments: Betadine followed by dry sterile dressing to amputation site.   06/08/22 1139            Follow-up Information     Loura Back, NP. Schedule an appointment as soon as possible for a visit in 1 week(s).   Specialty: Nurse Practitioner Why: BMP on wednesday 06/11/2022 Contact information: [address] [place] [address] [phone]                Disposition: {comingfrom:22515} Diet  recommendation: {dietplan:22518}  Discharge Exam: Vitals:   06/07/22 1549 06/07/22 2052 06/08/22 0615 06/08/22 0802  BP: (!) 159/68 (!) 149/70 (!) 156/69 130/61  Pulse: 72 68 70 72  Resp: 20 18 19 18   Temp: 98.1 F (36.7 C) 98.2 F (36.8 C) 98 F (36.7 C) 98.1 F (36.7 C)  TempSrc: Oral Oral Oral Oral  SpO2: 100% 100% 100% 100%  Weight:      Height:       General: Appear in {DEGREE - MILD, MOD, SEV:22033} distress; no visible Abnormal Neck Mass Or lumps, Conjunctiva normal Cardiovascular: S1 and S2 Present, *** Murmur, Respiratory: {Desc; increased/descreased:10091} respiratory effort, Bilateral Air entry present and ***CTA, *** Crackles, *** wheezes Abdomen: Bowel Sound present, *** Extremities: *** Pedal edema Neurology: {EXAM; NEURO ALERTNESS:23097} and {orientationexam:27336} *** Gait not checked due to patient safety concerns Filed Weights   06/01/22 2200 06/03/22 1720  Weight: 94.4 kg 94.4 kg   Condition at discharge: stable  The results of significant diagnostics from this hospitalization (including imaging, microbiology, ancillary and laboratory) are listed below for reference.   Imaging Studies: 06/05/22 RENAL  Result Date: 06/06/2022 CLINICAL DATA:  A 66 year old male presents for evaluation of acute kidney injury in the setting of CKD. EXAM: RENAL / URINARY TRACT ULTRASOUND COMPLETE COMPARISON:  March 29, 2022. FINDINGS: Right Kidney: Renal measurements: 8.2 x 5.9 x 6.4 cm = volume: 165 mL. Limited assessment without hydronephrosis. Signs of cortical scarring and increased cortical echogenicity. Thinning of renal parenchyma. Left Kidney: Renal measurements: 10.1 x 6.0 x 6.9 cm = volume: 218.3 mL. Thinning of the renal cortex with signs of cortical scarring. No hydronephrosis. Bladder: Appears normal for degree of bladder distention. Other: Assessment limited by patient body habitus. Signs of RIGHT-sided pleural effusion noted. IMPRESSION: Evidence of medical renal disease  with cortical scarring of the bilateral kidneys. No hydronephrosis. RIGHT-sided pleural effusion. Electronically Signed   By: March 31, 2022 M.D.   On: 06/06/2022 13:07   DG Foot 2 Views Left  Result Date: 06/03/2022 CLINICAL DATA:  Postop amputation of left toe. EXAM: LEFT FOOT - 2 VIEW COMPARISON:  Preoperative radiograph 06/01/2022 FINDINGS: Interval resection of the second proximal phalanx and amputation of the distal second metatarsal. There is section margin is sharp. There is been prior amputation of the great toe. Hammertoe deformity of the third, fourth, and fifth toes. Chronic degenerative change in the midfoot. No erosive or bony destructive change. Unchanged plantar calcaneal spur and Achilles tendon enthesophyte. No radiopaque foreign body. Dressing overlies the operative bed. No tracking soft tissue gas. IMPRESSION: 1. Interval resection of the second proximal phalanx and amputation of the distal second metatarsal. 2. Prior amputation of the great toe. Electronically Signed   By: 06/03/2022 M.D.   On: 06/03/2022 20:16  VAS US ABI WITH/WO TBI  Result Date: 06/02/2022  LOWER EXTREMITY DOPPLER STUDY Patient Name:  Terry Barton  Date of Exam:   06/02/2022 Medical Rec #: [id number]     Accession #:    [id number] Date of Birth: May 27, 1956     Patient Gender: M Patient Age:   [age] years [facility]  Procedure:      VAS US ABI WITH/WO TBI Referring Phys: Ulyess BlossomVASUNDHRA RATHORE --------------------------------------------------------------------------------  Indications: Left toe necrosis at amputation stump. High Risk Factors: Hypertension, hyperlipidemia, Diabetes. Other Factors: History of left great toe amputation and partial 2nd toe                amputation. CKD IIIa, anemia.  Comparison Study: No prior study Performing Technologist: Sherren KernsKanady, Candace RVS  Examination Guidelines: A complete evaluation includes at minimum, Doppler waveform signals and systolic blood pressure  reading at the level of bilateral brachial, anterior tibial, and posterior tibial arteries, when vessel segments are accessible. Bilateral testing is considered an integral part of a complete examination. Photoelectric Plethysmograph (PPG) waveforms and toe systolic pressure readings are included as required and additional duplex testing as needed. Limited examinations for reoccurring indications may be performed as noted.  ABI Findings: +---------+------------------+-----+-----------+---------------------+ Right    Rt Pressure (mmHg)IndexWaveform   Comment               +---------+------------------+-----+-----------+---------------------+ Brachial                                   patient is contracted +---------+------------------+-----+-----------+---------------------+ PTA      141               1.11 multiphasic                      +---------+------------------+-----+-----------+---------------------+ DP       142               1.12 multiphasic                      +---------+------------------+-----+-----------+---------------------+ Great Toe136               1.07                                  +---------+------------------+-----+-----------+---------------------+ +---------+------------------+-----+-----------+-----------+ Left     Lt Pressure (mmHg)IndexWaveform   Comment     +---------+------------------+-----+-----------+-----------+ Brachial 127                    triphasic              +---------+------------------+-----+-----------+-----------+ PTA      141               1.11 multiphasic            +---------+------------------+-----+-----------+-----------+ DP       139               1.09 multiphasic            +---------+------------------+-----+-----------+-----------+ Great Toe                                  amputations +---------+------------------+-----+-----------+-----------+  +-------+-----------+-----------+------------+------------+ ABI/TBIToday's ABIToday's TBIPrevious ABIPrevious TBI +-------+-----------+-----------+------------+------------+ Right  1.12       1.07                                +-------+-----------+-----------+------------+------------+  Left   1.11       amputations                         +-------+-----------+-----------+------------+------------+   Summary: Right: Resting right ankle-brachial index is within normal range. No evidence of significant right lower extremity arterial disease. The right toe-brachial index is normal. Left: Resting left ankle-brachial index is within normal range. No evidence of significant left lower extremity arterial disease. *See table(s) above for measurements and observations.  Electronically signed by Lemar Livings MD on 06/02/2022 at 3:14:54 PM.    Final    DG Foot Complete Left  Result Date: 06/01/2022 CLINICAL DATA:  Toe necrosis, evaluate for osteomyelitis. EXAM: LEFT FOOT - COMPLETE 3+ VIEW COMPARISON:  None Available. FINDINGS: No evidence of acute fracture or dislocation. There has been amputation of the first digit and the head of the proximal phalanx of the second digit and mid to distal phalanges of the second digit. No periosteal elevation. Tiny erosions are noted along the amputation site of the proximal phalanx of the second digit. Soft tissue swelling is noted at the amputation site with a tiny focus of air. Degenerative changes are noted in the midfoot and hindfoot. Calcaneal spurring is noted. Vascular calcifications are present in the soft tissues. IMPRESSION: 1. Tiny erosions along the amputation site at the head of the proximal phalanx of the second digit, concerning for osteomyelitis. There is associated soft tissue swelling in this region with a tiny focus of air. 2. Amputation of the first digit, and mid and distal phalanx of the second digit. Electronically Signed   By: Thornell Sartorius M.D.    On: 06/01/2022 21:44    Microbiology: Results for orders placed or performed during the hospital encounter of 03/29/22  Resp Panel by RT-PCR (Flu A&B, Covid) Urine, In & Out Cath     Status: None   Collection Time: 03/29/22 10:32 PM   Specimen: Urine, In & Out Cath; Nasopharyngeal(NP) swabs in vial transport medium  Result Value Ref Range Status   SARS Coronavirus 2 by RT PCR NEGATIVE NEGATIVE Final    Comment: (NOTE) SARS-CoV-2 target nucleic acids are NOT DETECTED.  The SARS-CoV-2 RNA is generally detectable in upper respiratory specimens during the acute phase of infection. The lowest concentration of SARS-CoV-2 viral copies this assay can detect is 138 copies/mL. A negative result does not preclude SARS-Cov-2 infection and should not be used as the sole basis for treatment or other patient management decisions. A negative result may occur with  improper specimen collection/handling, submission of specimen other than nasopharyngeal swab, presence of viral mutation(s) within the areas targeted by this assay, and inadequate number of viral copies(<138 copies/mL). A negative result must be combined with clinical observations, patient history, and epidemiological information. The expected result is Negative.  Fact Sheet for Patients:  BloggerCourse.com  Fact Sheet for Healthcare Providers:  SeriousBroker.it  This test is no t yet approved or cleared by the [place] FDA and  has been authorized for detection and/or diagnosis of SARS-CoV-2 by FDA under an Emergency Use Authorization (EUA). This EUA will remain  in effect (meaning this test can be used) for the duration of the COVID-19 declaration under Section 564(b)(1) of the Act, 21 U.S.C.section 360bbb-3(b)(1), unless the authorization is terminated  or revoked sooner.       Influenza A by PCR NEGATIVE NEGATIVE Final   Influenza B by PCR NEGATIVE NEGATIVE Final     Comment: (  NOTE) The Xpert Xpress SARS-CoV-2/FLU/RSV plus assay is intended as an aid in the diagnosis of influenza from Nasopharyngeal swab specimens and should not be used as a sole basis for treatment. Nasal washings and aspirates are unacceptable for Xpert Xpress SARS-CoV-2/FLU/RSV testing.  Fact Sheet for Patients: BloggerCourse.com  Fact Sheet for Healthcare Providers: SeriousBroker.it  This test is not yet approved or cleared by the [place] FDA and has been authorized for detection and/or diagnosis of SARS-CoV-2 by FDA under an Emergency Use Authorization (EUA). This EUA will remain in effect (meaning this test can be used) for the duration of the COVID-19 declaration under Section 564(b)(1) of the Act, 21 U.S.C. section 360bbb-3(b)(1), unless the authorization is terminated or revoked.  Performed at [facility], [address], [place], [place] [address]   Urine Culture     Status: Abnormal   Collection Time: 03/29/22 10:32 PM   Specimen: Urine, Clean Catch  Result Value Ref Range Status   Specimen Description URINE, CLEAN CATCH  Final   Special Requests NONE  Final   Culture (A)  Final    >=100,000 COLONIES/mL ESCHERICHIA COLI >=100,000 COLONIES/mL GROUP B STREP(S.AGALACTIAE)ISOLATED TESTING AGAINST S. AGALACTIAE NOT ROUTINELY PERFORMED DUE TO PREDICTABILITY OF AMP/PEN/VAN SUSCEPTIBILITY. Performed at [facility], [address], [place], [place] [address]    Report Status 04/01/2022 FINAL  Final   Organism ID, Bacteria ESCHERICHIA COLI (A)  Final      Susceptibility   Escherichia coli - MIC*    AMPICILLIN >=32 RESISTANT Resistant     CEFAZOLIN <=4 SENSITIVE Sensitive     CEFEPIME <=0.12 SENSITIVE Sensitive     CEFTRIAXONE <=0.25 SENSITIVE Sensitive     CIPROFLOXACIN <=0.25 SENSITIVE Sensitive     GENTAMICIN >=16 RESISTANT Resistant     IMIPENEM 0.5 SENSITIVE Sensitive     NITROFURANTOIN <=16  SENSITIVE Sensitive     TRIMETH/SULFA >=320 RESISTANT Resistant     AMPICILLIN/SULBACTAM 16 INTERMEDIATE Intermediate     PIP/TAZO <=4 SENSITIVE Sensitive     * >=100,000 COLONIES/mL ESCHERICHIA COLI  Culture, blood (Routine X 2) w Reflex to ID Panel     Status: None   Collection Time: 03/30/22  8:00 AM   Specimen: BLOOD  Result Value Ref Range Status   Specimen Description BLOOD SITE NOT SPECIFIED  Final   Special Requests   Final    BOTTLES DRAWN AEROBIC AND ANAEROBIC Blood Culture results may not be optimal due to an inadequate volume of blood received in culture bottles   Culture   Final    NO GROWTH 5 DAYS Performed at [facility], [address] [place], [place] [address]    Report Status 04/04/2022 FINAL  Final  Culture, blood (Routine X 2) w Reflex to ID Panel     Status: None   Collection Time: 03/30/22  5:55 PM   Specimen: BLOOD  Result Value Ref Range Status   Specimen Description BLOOD SITE NOT SPECIFIED  Final   Special Requests   Final    IN PEDIATRIC BOTTLE Blood Culture results may not be optimal due to an excessive volume of blood received in culture bottles   Culture   Final    NO GROWTH 5 DAYS Performed at [facility], [address], [place], [place] [address]    Report Status 04/04/2022 FINAL  Final    Labs: CBC: Recent Labs  Lab 06/01/22 2151 06/02/22 0244 06/05/22 0200 06/05/22 1327 06/06/22 0048 06/07/22 0153  06/08/22 0011  WBC 10.6*   < > 12.2* 13.0* 11.1* 10.1 10.2  NEUTROABS 6.5  --   --   --   --   --   --   HGB 10.2*   < > 8.6* 9.2* 8.8* 8.7* 9.0*  HCT 32.5*   < > 26.9* 29.0* 28.4* 27.2* 28.7*  MCV 100.3*   < > 98.9 97.6 99.6 98.2 100.0  PLT 351   < > 299 223 283 281 267   < > = values in this interval not displayed.   Basic Metabolic Panel: Recent Labs  Lab 06/04/22 0208 06/05/22 0200 06/05/22 1327 06/06/22 0048 06/07/22 0153 06/08/22 0011  NA 138 138 137 136 139 138  K 4.3 4.3 4.1 4.2 4.2 3.9  CL 116* 113* 111  113* 113* 111  CO2 13* 16* 15* 16* 20* 20*  GLUCOSE 212* 155* 117* 117* 113* 121*  BUN 26* 34* 36* 37* 37* 41*  CREATININE 1.73* 1.96* 2.20* 2.31* 2.35* 2.48*  CALCIUM 8.9 8.7* 8.9 8.7* 8.6* 8.7*  MG 1.6* 2.0  --  1.7 1.6* 1.9  PHOS  --   --  2.2*  --   --   --    Liver Function Tests: Recent Labs  Lab 06/05/22 1327  ALBUMIN 2.6*   CBG: Recent Labs  Lab 06/03/22 1940  GLUCAP 75    Discharge time spent: {LESS THAN/GREATER THAN:26388} 30 minutes.  Signed: Lynden Oxford, MD [facility]

## 2022-06-08 NOTE — Plan of Care (Signed)
  Problem: Clinical Measurements: Goal: Will remain free from infection Outcome: Progressing   Problem: Clinical Measurements: Goal: Diagnostic test results will improve Outcome: Progressing   Problem: Nutrition: Goal: Adequate nutrition will be maintained Outcome: Progressing   

## 2022-06-08 NOTE — Progress Notes (Signed)
Subjective: Muhammed Teutsch is a 66 y.o. POD # 4 s/p left partial second ray amputation.  States he is doing well.  No fevers or chills that he reports.  Creatinine still elevating.  Objective: General: No acute distress, AAOx3  DP/PT pulses palpable  Dressing clean, dry, intact.  Dressing removed in the incision well coapted with sutures intact.  There is mild edema.  There is any well any signs of dehiscence.  No drainage or pus.  No significant cellulitis present.  No fluctuance or crepitation. No pain with calf compression, swelling, warmth, erythema.       Assessment and Plan:  POD # 4 s/p left partial second amputation  -From the foot standpoint is doing well.  Dressing was changed today.  Small mount of Betadine was applied followed by dry sterile dressing. -White blood cell count 10.2 and is afebrile. -Elevation. -If patient still in the hospital on Tuesday or Wednesday we will plan on changing the dressing then.  Otherwise from podiatry standpoint is able to be discharged once medically stable.  He will follow-up with [place] in the office upon discharge with close follow-up.  Ovid Curd, DPM

## 2022-06-28 ENCOUNTER — Ambulatory Visit (INDEPENDENT_AMBULATORY_CARE_PROVIDER_SITE_OTHER): Payer: Medicare Other | Admitting: Podiatry

## 2022-06-28 DIAGNOSIS — M86172 Other acute osteomyelitis, left ankle and foot: Secondary | ICD-10-CM | POA: Diagnosis not present

## 2022-07-01 NOTE — Progress Notes (Signed)
Subjective: Burnett Spray is a 66 y.o. 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  States that he has been doing well.  No pain.  His son has been changing the bandage.  Denies any fevers or chills.  No other concerns.     Objective: General: No acute distress, AAOx3  DP/PT pulses palpable, CRT < 3 sec to all digits.  Left foot: Incision is well coapted without any evidence of dehiscence and sutures are intact. There is no surrounding erythema, ascending cellulitis, fluctuance, crepitus, malodor, drainage/purulence. There is slight edema around the surgical site. There is no pain along the surgical site.  No other areas of tenderness to bilateral lower extremities.  No other open lesions or pre-ulcerative lesions.  No pain with calf compression, swelling, warmth, erythema.   Assessment and Plan:  Status post left second toe amputation, doing well with no complications   -Treatment options discussed including all alternatives, risks, and complications -Sutures removed today without complications.  Scissors were intact.  A few Steri-Strips were applied for reinforcement followed by antibiotic ointment and a bandage.  Continue daily dressing changes. -Surgical shoe -Elevation -Monitor for any clinical signs or symptoms of infection and DVT/PE and directed to call the office immediately should any occur or go to the ER. -Follow-up as scheduled or sooner if any problems arise. In the meantime, encouraged to call the office with any questions, concerns, change in symptoms.   Vivi Barrack DPM

## 2022-07-18 ENCOUNTER — Ambulatory Visit (INDEPENDENT_AMBULATORY_CARE_PROVIDER_SITE_OTHER): Payer: Medicare Other | Admitting: Podiatry

## 2022-07-18 DIAGNOSIS — M79675 Pain in left toe(s): Secondary | ICD-10-CM | POA: Diagnosis not present

## 2022-07-18 DIAGNOSIS — L97521 Non-pressure chronic ulcer of other part of left foot limited to breakdown of skin: Secondary | ICD-10-CM | POA: Diagnosis not present

## 2022-07-18 DIAGNOSIS — M79674 Pain in right toe(s): Secondary | ICD-10-CM | POA: Diagnosis not present

## 2022-07-18 DIAGNOSIS — M86172 Other acute osteomyelitis, left ankle and foot: Secondary | ICD-10-CM

## 2022-07-18 DIAGNOSIS — B351 Tinea unguium: Secondary | ICD-10-CM | POA: Diagnosis not present

## 2022-07-18 NOTE — Progress Notes (Unsigned)
Subjective:  Chief Complaint  Patient presents with   Routine Post Op    Status post left second toe amputation, doing well with no complications pt hit right hallux and nail has come off     Terry Barton is a 66 y.o. 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  States that he has been doing well.  No pain.  His son has been changing the bandage.  Denies any fevers or chills.  No other concerns.     Objective: General: No acute distress, AAOx3  DP/PT pulses palpable, CRT < 3 sec to all digits.  Left foot:  Right foot:  No pain with calf compression, swelling, warmth, erythema.        Assessment and Plan:  Status post left second toe amputation, doing well with no complications   -Treatment options discussed including all alternatives, risks, and complications -Sutures removed today without complications.  Scissors were intact.  A few Steri-Strips were applied for reinforcement followed by antibiotic ointment and a bandage.  Continue daily dressing changes. -Surgical shoe -Elevation -Monitor for any clinical signs or symptoms of infection and DVT/PE and directed to call the office immediately should any occur or go to the ER. -Follow-up as scheduled or sooner if any problems arise. In the meantime, encouraged to call the office with any questions, concerns, change in symptoms.   Vivi Barrack DPM

## 2022-08-11 IMAGING — US US ABDOMEN LIMITED
2 series · 14 of 25 positions shown · non-contrast
Comparison: CT today

CLINICAL DATA: Abdominal pain, abnormal CT

EXAM:
ULTRASOUND ABDOMEN LIMITED RIGHT UPPER QUADRANT

[Series 1: us abdomen limited ruq (liver/gb) · 13 of 33 slices shown]
[im 1/33]
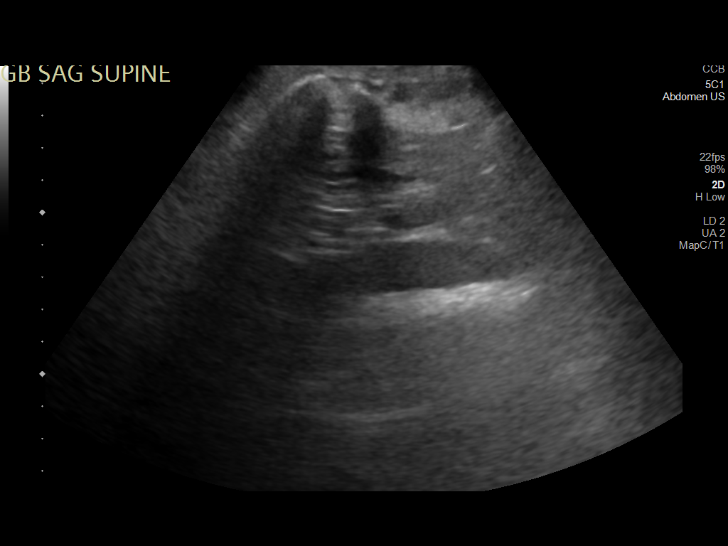
[im 3/33]
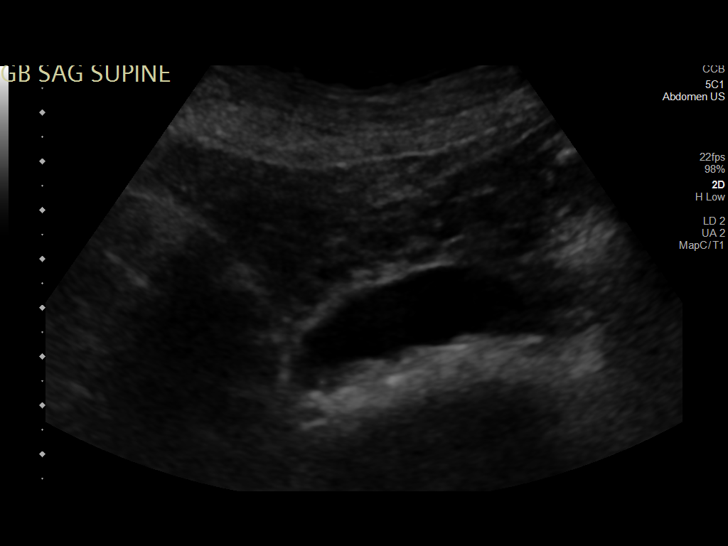
[im 6/33]
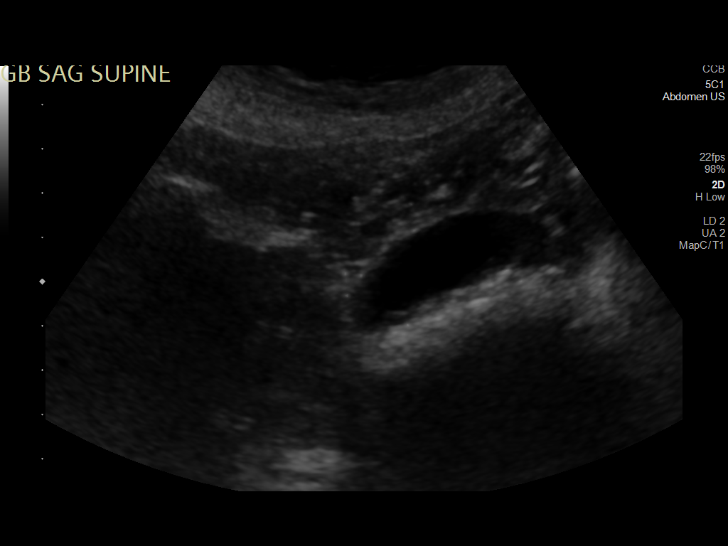
[im 9/33]
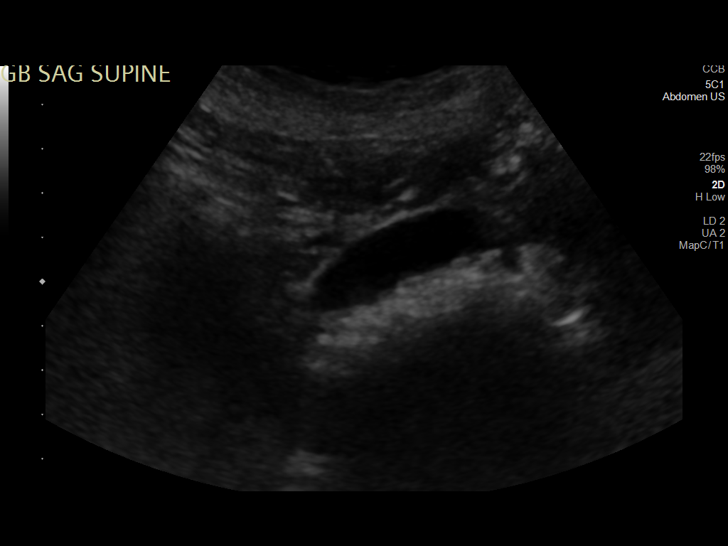
[im 12/33]
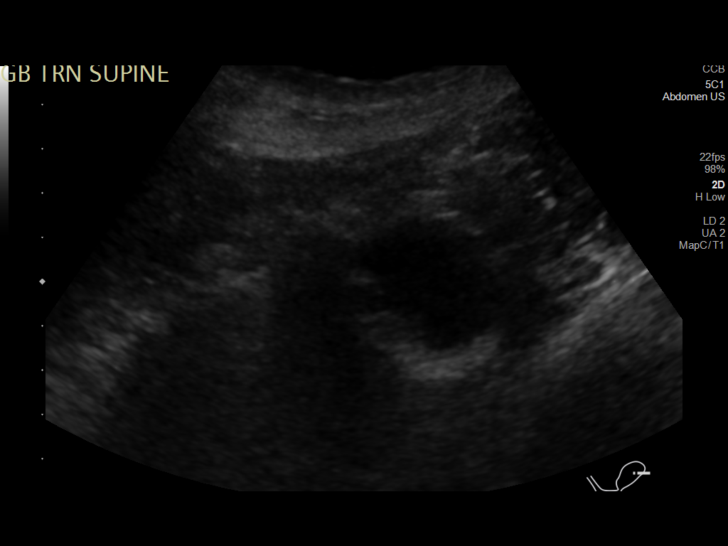
[im 13/33]
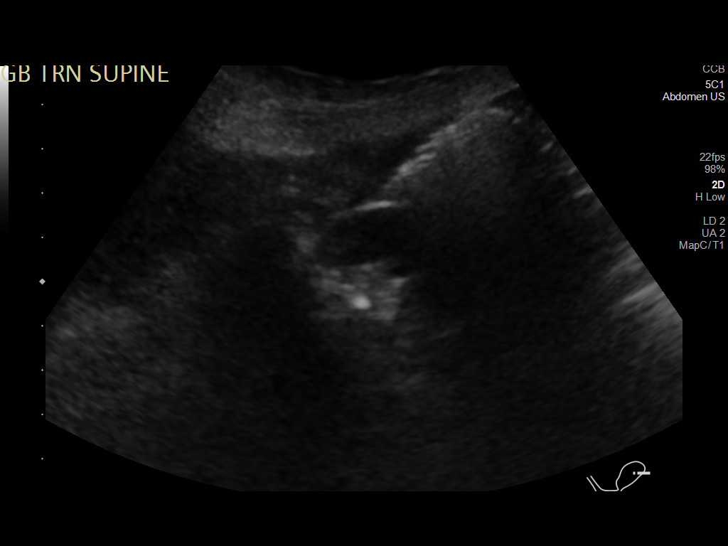
[im 16/33]
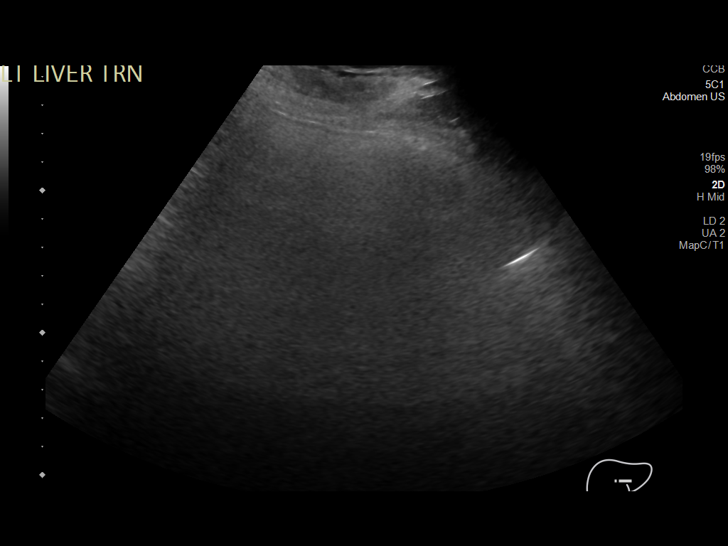
[im 19/33]
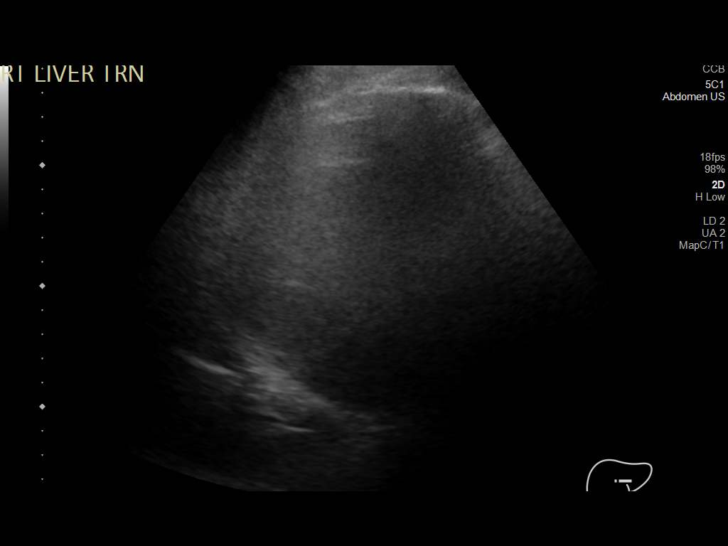
[im 21/33]
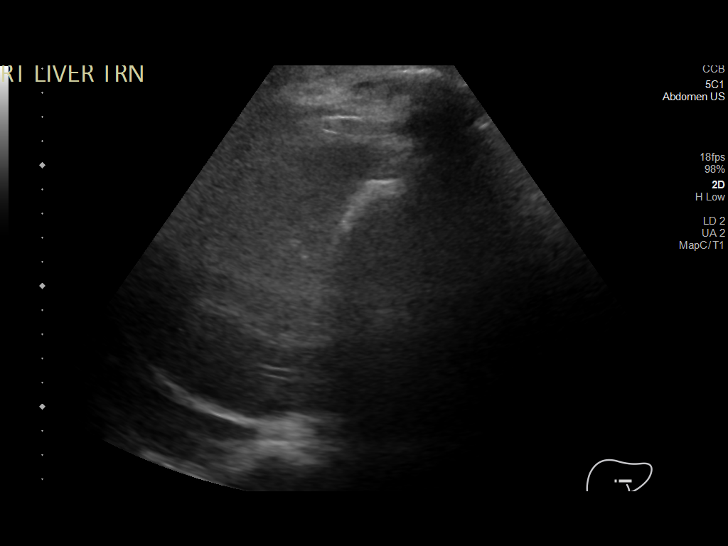
[im 23/33]
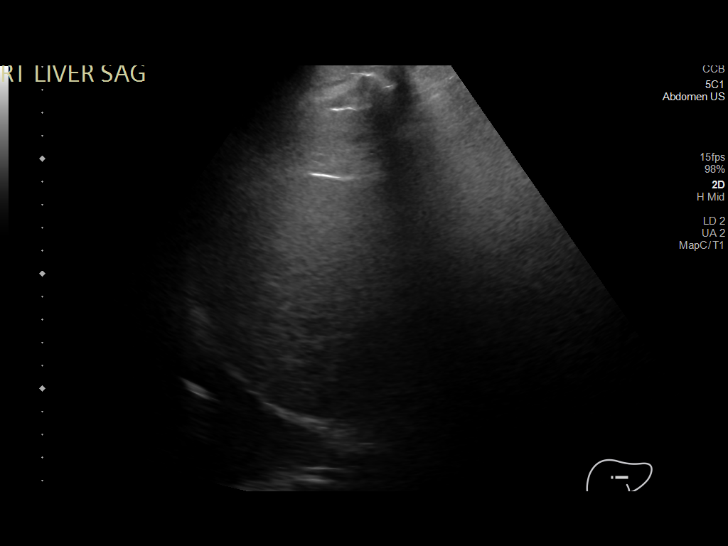
[im 26/33]
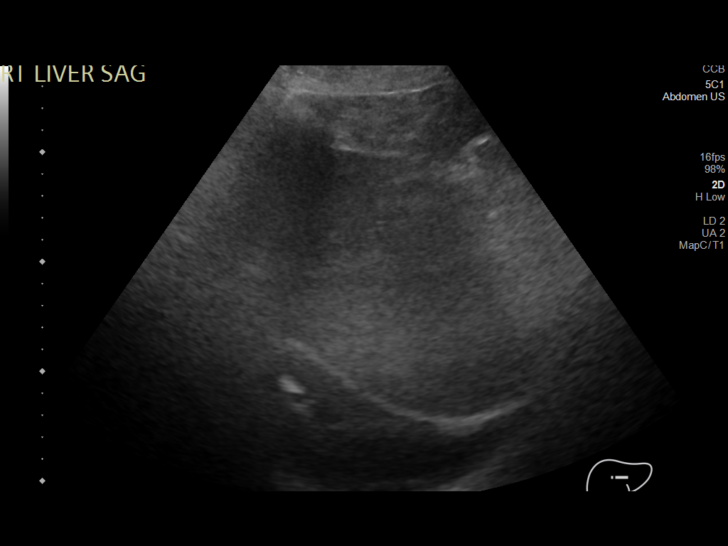
[im 28/33]
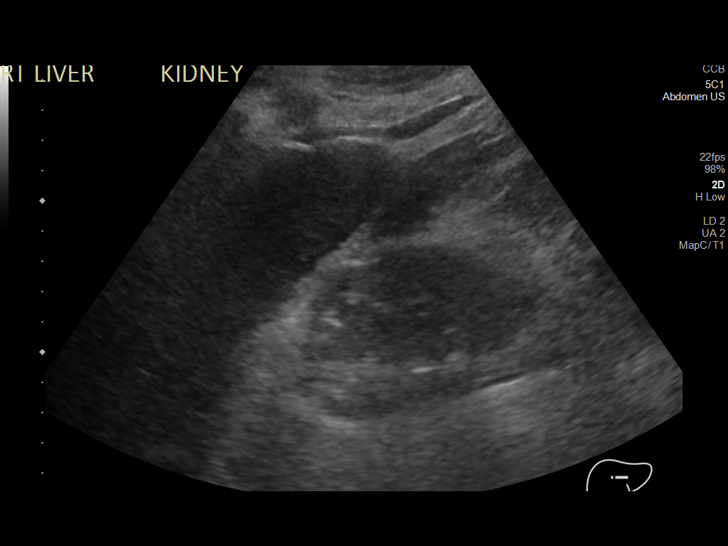
[im 31/33]
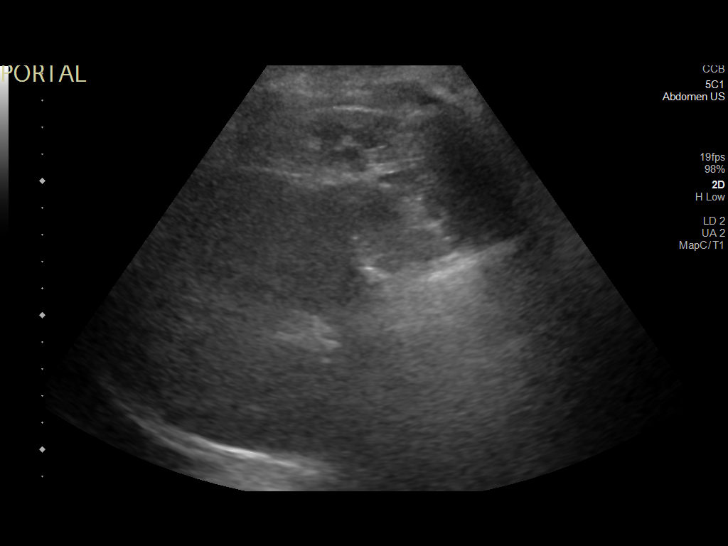

[Series 1001: abdomen us · 1 of 1 slices shown]
[im 1/1]
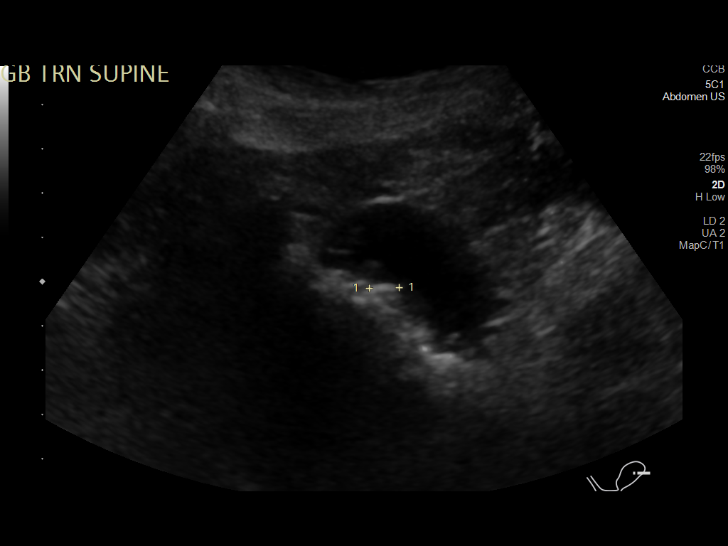

[14 of 25 positions shown; findings below may reference images not displayed]

FINDINGS: Gallbladder:

Layering stones and sludge within the gallbladder. No wall
thickening or sonographic Murphy sign.

Common bile duct:

Diameter: Not visualized. Common bile duct was normal caliber on
earlier CT.

Liver:

Difficult to visualize due to bowel gas. No visible focal
abnormality. Portal vein is patent on color Doppler imaging with
normal direction of blood flow towards the liver.

Other: None.
IMPRESSION: Cholelithiasis.  No sonographic evidence of acute cholecystitis.

Limited study due to bowel gas.

## 2022-08-11 IMAGING — DX DG CHEST 1V PORT
1 series · 1 of 1 positions shown · non-contrast
Comparison: 05/27/2020

CLINICAL DATA: Cough

EXAM:
PORTABLE CHEST 1 VIEW

[chest ap]
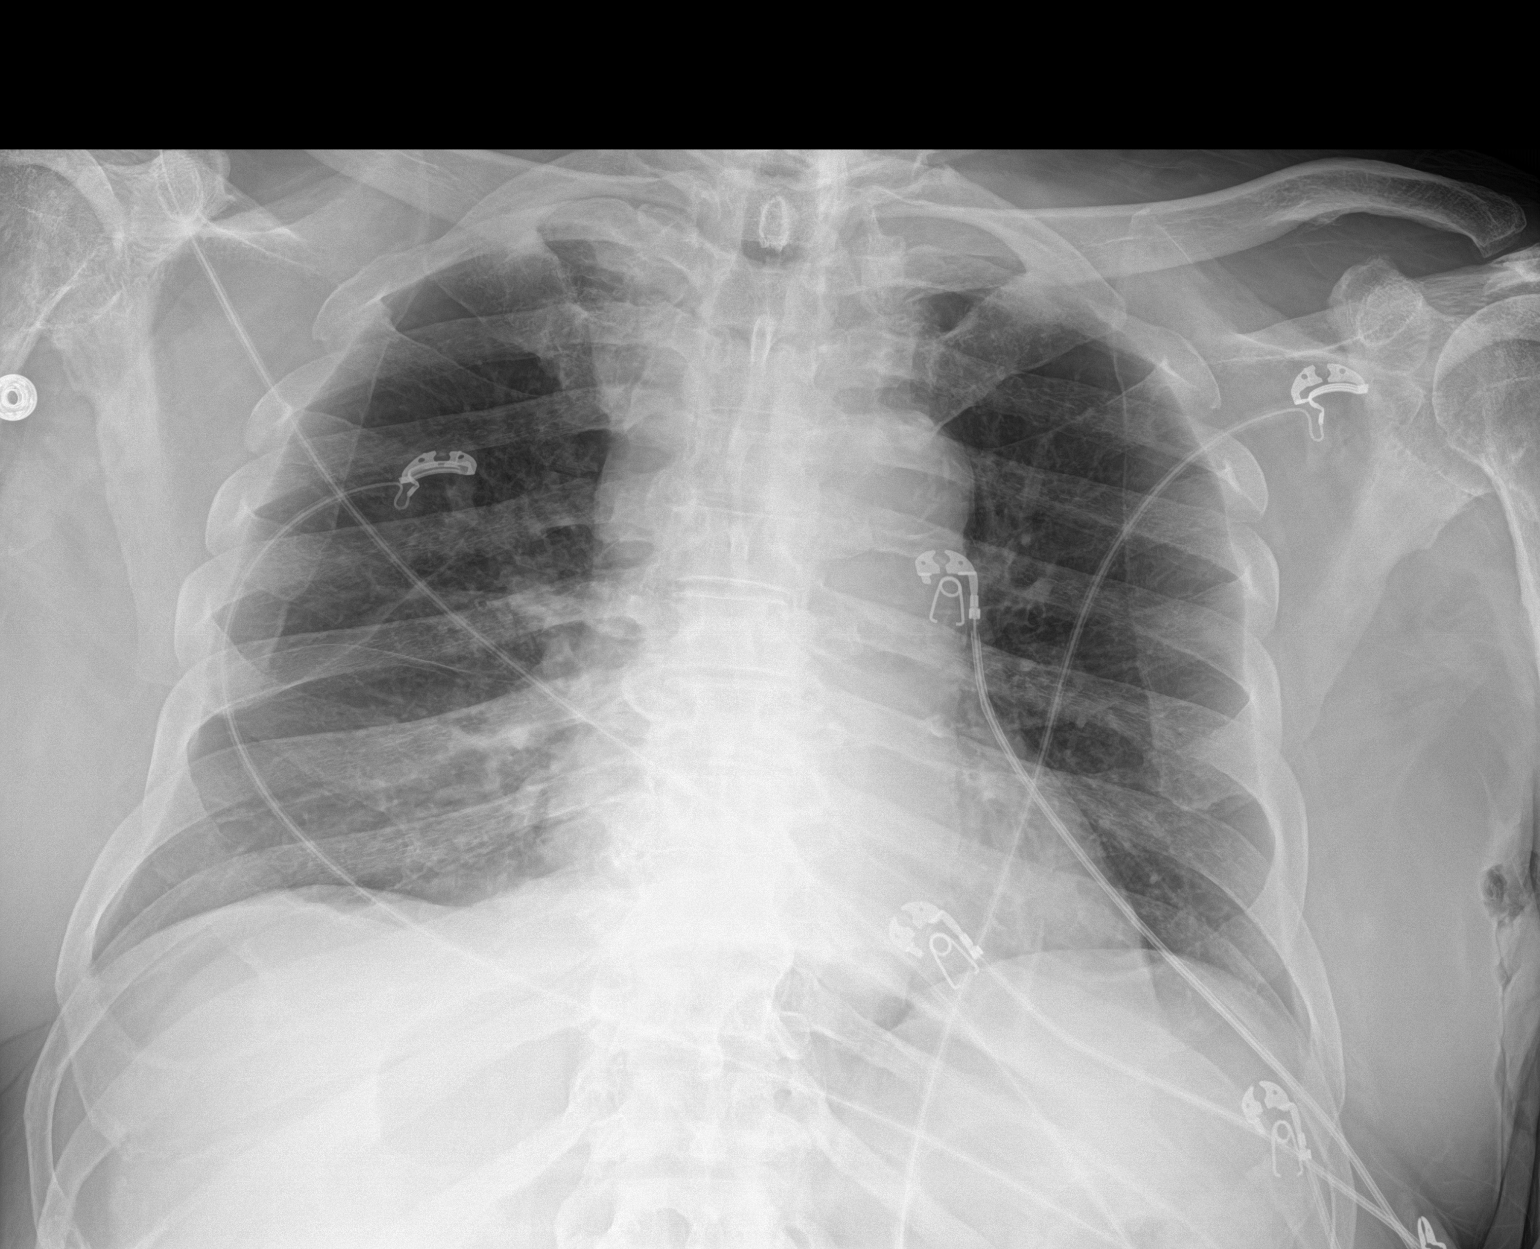

[1 of 1 positions shown; findings below may reference images not displayed]

FINDINGS: The heart size and mediastinal contours are within normal limits.
Both lungs are clear. The visualized skeletal structures are
unremarkable.
IMPRESSION: No active disease.

## 2022-08-12 IMAGING — NM NM HEPATOBILIARY IMAGE, INC GB
1 series · 6 of 6 positions shown · non-contrast
Comparison: Right upper quadrant ultrasound 03/29/2022 CT abdomen
and pelvis 03/29/2022

CLINICAL DATA: Right upper quadrant ultrasound evidence of stones
and thickened gallbladder wall. Unable to visualized common bile
duct.

EXAM:
NUCLEAR MEDICINE HEPATOBILIARY IMAGING
TECHNIQUE: Sequential images of the abdomen were obtained [DATE] minutes
following intravenous administration of radiopharmaceutical.
RADIOPHARMACEUTICALS:  5.5 mCi Sc-77m Choletec IV

[he hepatobiliary · 4.52mm/px · 6 of 60 frames shown]
[frame 6/60]
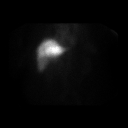
[frame 16/60]
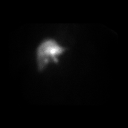
[frame 26/60]
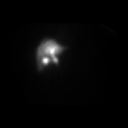
[frame 36/60]
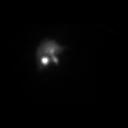
[frame 46/60]
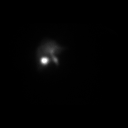
[frame 56/60]
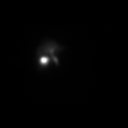

[6 of 6 positions shown; findings below may reference images not displayed]

FINDINGS: Prompt uptake and biliary excretion of activity by the liver is
seen. Gallbladder activity is visualized starting at 15 minutes,
consistent with patency of cystic duct.

Activity is seen extending into the distal common bile duct
throughout the 1 hour study, however no definite uptake is seen
within the bowel when this study was concluded at 1 hour.
IMPRESSION: 1. Normal filling of the gallbladder duct indicating patency of the
cystic duct. Acute cholecystitis is unlikely.

2. Radiotracer is seen within the more distal common bile duct
throughout the 1 hour study. At the conclusion of the study,
contrast had not yet entered the small bowel.

## 2022-09-26 ENCOUNTER — Ambulatory Visit: Payer: Medicare Other | Admitting: Podiatry

## 2022-10-07 ENCOUNTER — Ambulatory Visit: Payer: Medicare Other | Admitting: Podiatry

## 2022-11-17 ENCOUNTER — Ambulatory Visit (INDEPENDENT_AMBULATORY_CARE_PROVIDER_SITE_OTHER): Payer: Medicare Other | Admitting: Podiatry

## 2022-11-17 DIAGNOSIS — L97521 Non-pressure chronic ulcer of other part of left foot limited to breakdown of skin: Secondary | ICD-10-CM

## 2022-11-17 MED ORDER — DOXYCYCLINE HYCLATE 100 MG PO TABS: 100.0000 mg | ORAL_TABLET | Freq: Two times a day (BID) | ORAL | 0 refills | Status: DC

## 2022-11-24 NOTE — Progress Notes (Signed)
Subjective:  Chief Complaint  Patient presents with   Diabetic Ulcer    Left foot 2nd toe ulcer, doing better, Diabetic foot care, A1c- 7.2, BG-104      Terry Barton is a 67 y.o. 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  Amputation site appears to be healed but he has a wound on the left third toe and does have some swelling.  Denies any drainage or pus.  They have been keeping a dressing on this daily.  No fevers or chills that he reports.   Objective: General: No acute distress, AAOx3  DP/PT pulses palpable  Left foot: Status post second amputation which appears to be healed.  There is a ulceration present on the left third toe dorsal IPJ and there is localized edema to the toe present.  No fluctuance or crepitation.  No malodor.  No ascending cellulitis. No pain with calf compression, swelling, warmth, erythema.          Assessment and Plan:  Left third toe ulceration, edema  -Treatment options discussed including all alternatives, risks, and complications -Picture above is after debridement.  Prior to debridement there was a callus which I debrided today without any comp.  Continue with antibiotic changes daily.  Prescribed doxycycline.Offloading (he does not wear shoes at home) -Monitor for any clinical signs or symptoms of infection and directed to call the office immediately should any occur or go to the ER.  X-ray next appointment  Terry Barton DPM     -Procedure sites healing well.  Recommend moisturizer daily. -Continue antibiotic ointment to the right hallux, left third toe daily.  Monitor for any signs or symptoms of infection.  Continue offloading.  Nonhealing X 1 to 2 weeks to let me know or sooner if there is any worsening. -Sharp debrided nails x6 without any complications or bleeding. -Daily foot inspection  Return in about 9 weeks (around 09/19/2022).  Terry Barton DPM

## 2022-12-15 ENCOUNTER — Other Ambulatory Visit: Payer: Medicare Other

## 2022-12-15 ENCOUNTER — Ambulatory Visit (INDEPENDENT_AMBULATORY_CARE_PROVIDER_SITE_OTHER): Payer: Medicare HMO | Admitting: Podiatry

## 2022-12-15 ENCOUNTER — Ambulatory Visit: Payer: Medicare Other | Admitting: Podiatry

## 2022-12-15 ENCOUNTER — Ambulatory Visit (INDEPENDENT_AMBULATORY_CARE_PROVIDER_SITE_OTHER): Payer: Medicaid Other

## 2022-12-15 VITALS — BP 138/77 | HR 73

## 2022-12-15 DIAGNOSIS — Z89422 Acquired absence of other left toe(s): Secondary | ICD-10-CM | POA: Diagnosis not present

## 2022-12-15 DIAGNOSIS — Z89412 Acquired absence of left great toe: Secondary | ICD-10-CM

## 2022-12-15 DIAGNOSIS — L97521 Non-pressure chronic ulcer of other part of left foot limited to breakdown of skin: Secondary | ICD-10-CM | POA: Diagnosis not present

## 2022-12-15 NOTE — Progress Notes (Signed)
Error  Order placed for [facility]

## 2022-12-17 NOTE — Progress Notes (Signed)
Subjective:  Chief Complaint  Patient presents with   Foot Ulcer    Left foot 2nd toe ulcer follow-up       Terry Barton is a 67 y.o. 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  He has been on antibiotics.  To the toes doing better.  No drainage or pus.  The swelling is also improved.  No new lesions are present.  Denies any fevers or chills.  Objective: General: No acute distress, AAOx3  DP/PT pulses palpable  Left foot: Status post second amputation which appears to be healed.  On the third toe preulcerative areas noted but there is no skin breakdown today.  There is no drainage or pus.  Decrease edema.  There is still some slight hyperpigmentation of the toe but appears to be much improved as the swelling has improved.  There is no increased temperature to the toe.  No ascending cellulitis. No pain with calf compression, swelling, warmth, erythema.     Assessment and Plan:  Left third toe ulceration, edema  -Treatment options discussed including all alternatives, risks, and complications -Overall toe is doing much better.  There is no open lesions.  I would continue with daily dressing changes and offloading.  I do think you will benefit from custom shoes given the amputations as well as digital deformity ulcerations. Prescription given as we were not able to do them in our office.  -Daily foot inspection.  Return in about 9 weeks (around 02/16/2023).  Trula Slade DPM  -Picture above is after debridement.  Prior to debridement there was a callus which I debrided today without any comp.  Continue with antibiotic changes daily.  Prescribed doxycycline.Offloading (he does not wear shoes at home) -Monitor for any clinical signs or symptoms of infection and directed to call the office immediately should any occur or go to the ER.  X-ray next appointment  Trula Slade DPM     -Procedure sites  healing well.  Recommend moisturizer daily. -Continue antibiotic ointment to the right hallux, left third toe daily.  Monitor for any signs or symptoms of infection.  Continue offloading.  Nonhealing X 1 to 2 weeks to let me know or sooner if there is any worsening. -Sharp debrided nails x6 without any complications or bleeding. -Daily foot inspection  Return in about 9 weeks (around 09/19/2022).  Trula Slade DPM

## 2023-01-05 ENCOUNTER — Other Ambulatory Visit: Payer: Medicare Other

## 2023-01-05 ENCOUNTER — Ambulatory Visit: Payer: Medicare Other | Admitting: Podiatry

## 2023-02-16 ENCOUNTER — Ambulatory Visit: Payer: Medicare HMO | Admitting: Podiatry

## 2023-03-31 ENCOUNTER — Inpatient Hospital Stay (HOSPITAL_COMMUNITY)
Admission: EM | Admit: 2023-03-31 | Discharge: 2023-04-11 | DRG: 345 | Disposition: A | Discharge status: Home / Self Care | Payer: Medicare HMO | Attending: Internal Medicine | Admitting: Internal Medicine

## 2023-03-31 ENCOUNTER — Other Ambulatory Visit: Payer: Self-pay

## 2023-03-31 DIAGNOSIS — Z89412 Acquired absence of left great toe: Secondary | ICD-10-CM

## 2023-03-31 DIAGNOSIS — E785 Hyperlipidemia, unspecified: Secondary | ICD-10-CM | POA: Diagnosis present

## 2023-03-31 DIAGNOSIS — I129 Hypertensive chronic kidney disease with stage 1 through stage 4 chronic kidney disease, or unspecified chronic kidney disease: Secondary | ICD-10-CM | POA: Diagnosis present

## 2023-03-31 DIAGNOSIS — Z9181 History of falling: Secondary | ICD-10-CM

## 2023-03-31 DIAGNOSIS — R935 Abnormal findings on diagnostic imaging of other abdominal regions, including retroperitoneum: Secondary | ICD-10-CM

## 2023-03-31 DIAGNOSIS — E1140 Type 2 diabetes mellitus with diabetic neuropathy, unspecified: Secondary | ICD-10-CM | POA: Diagnosis present

## 2023-03-31 DIAGNOSIS — G8220 Paraplegia, unspecified: Secondary | ICD-10-CM | POA: Diagnosis present

## 2023-03-31 DIAGNOSIS — Z79899 Other long term (current) drug therapy: Secondary | ICD-10-CM

## 2023-03-31 DIAGNOSIS — Z6831 Body mass index (BMI) 31.0-31.9, adult: Secondary | ICD-10-CM

## 2023-03-31 DIAGNOSIS — Z993 Dependence on wheelchair: Secondary | ICD-10-CM

## 2023-03-31 DIAGNOSIS — R140 Abdominal distension (gaseous): Secondary | ICD-10-CM | POA: Diagnosis not present

## 2023-03-31 DIAGNOSIS — R630 Anorexia: Secondary | ICD-10-CM | POA: Diagnosis present

## 2023-03-31 DIAGNOSIS — K5939 Other megacolon: Secondary | ICD-10-CM | POA: Diagnosis present

## 2023-03-31 DIAGNOSIS — K629 Disease of anus and rectum, unspecified: Secondary | ICD-10-CM | POA: Diagnosis present

## 2023-03-31 DIAGNOSIS — I69331 Monoplegia of upper limb following cerebral infarction affecting right dominant side: Secondary | ICD-10-CM

## 2023-03-31 DIAGNOSIS — D128 Benign neoplasm of rectum: Secondary | ICD-10-CM | POA: Diagnosis present

## 2023-03-31 DIAGNOSIS — K5981 Ogilvie syndrome: Principal | ICD-10-CM

## 2023-03-31 DIAGNOSIS — E1169 Type 2 diabetes mellitus with other specified complication: Secondary | ICD-10-CM

## 2023-03-31 DIAGNOSIS — Z79891 Long term (current) use of opiate analgesic: Secondary | ICD-10-CM

## 2023-03-31 DIAGNOSIS — Z89422 Acquired absence of other left toe(s): Secondary | ICD-10-CM

## 2023-03-31 DIAGNOSIS — J90 Pleural effusion, not elsewhere classified: Secondary | ICD-10-CM | POA: Diagnosis present

## 2023-03-31 DIAGNOSIS — Z79890 Hormone replacement therapy (postmenopausal): Secondary | ICD-10-CM

## 2023-03-31 DIAGNOSIS — E039 Hypothyroidism, unspecified: Secondary | ICD-10-CM | POA: Diagnosis present

## 2023-03-31 DIAGNOSIS — N179 Acute kidney failure, unspecified: Secondary | ICD-10-CM | POA: Diagnosis present

## 2023-03-31 DIAGNOSIS — E669 Obesity, unspecified: Secondary | ICD-10-CM | POA: Diagnosis present

## 2023-03-31 DIAGNOSIS — E1122 Type 2 diabetes mellitus with diabetic chronic kidney disease: Secondary | ICD-10-CM | POA: Diagnosis present

## 2023-03-31 DIAGNOSIS — E876 Hypokalemia: Secondary | ICD-10-CM | POA: Diagnosis present

## 2023-03-31 DIAGNOSIS — N1832 Chronic kidney disease, stage 3b: Secondary | ICD-10-CM | POA: Diagnosis present

## 2023-03-31 DIAGNOSIS — Z7401 Bed confinement status: Secondary | ICD-10-CM

## 2023-03-31 DIAGNOSIS — E1165 Type 2 diabetes mellitus with hyperglycemia: Secondary | ICD-10-CM | POA: Diagnosis present

## 2023-03-31 DIAGNOSIS — I10 Essential (primary) hypertension: Secondary | ICD-10-CM | POA: Diagnosis present

## 2023-03-31 DIAGNOSIS — N390 Urinary tract infection, site not specified: Secondary | ICD-10-CM | POA: Diagnosis present

## 2023-03-31 HISTORY — DX: Paraplegia, unspecified: G82.20

## 2023-03-31 NOTE — ED Triage Notes (Signed)
Pt arrived via [facility] for fluid retention from home. Pt family called EMS for transport to the hospital after increasing swelling in abdomen (distended, but not tender) and pitting edema in bilateral lower extremities. PMH renal disease, not on diuretics or dialysis. Pt denies any other symptoms - no chest pain, shortness of breath, n/v/d. PPMH stroke with right sided deficits, bed bound. GCS 15, A&Ox4.  PTA EMS Vitals  BP 164/88 HR 74 NS SPO2 99% RA RR 18 CBG 186

## 2023-04-01 ENCOUNTER — Emergency Department (HOSPITAL_COMMUNITY): Payer: Medicare HMO

## 2023-04-01 ENCOUNTER — Encounter (HOSPITAL_COMMUNITY): Payer: Self-pay | Admitting: Internal Medicine

## 2023-04-01 DIAGNOSIS — N189 Chronic kidney disease, unspecified: Secondary | ICD-10-CM | POA: Diagnosis not present

## 2023-04-01 DIAGNOSIS — I10 Essential (primary) hypertension: Secondary | ICD-10-CM | POA: Diagnosis not present

## 2023-04-01 DIAGNOSIS — K5939 Other megacolon: Secondary | ICD-10-CM | POA: Diagnosis not present

## 2023-04-01 DIAGNOSIS — E1165 Type 2 diabetes mellitus with hyperglycemia: Secondary | ICD-10-CM | POA: Diagnosis present

## 2023-04-01 DIAGNOSIS — K629 Disease of anus and rectum, unspecified: Secondary | ICD-10-CM | POA: Diagnosis present

## 2023-04-01 DIAGNOSIS — R630 Anorexia: Secondary | ICD-10-CM | POA: Diagnosis present

## 2023-04-01 DIAGNOSIS — E1140 Type 2 diabetes mellitus with diabetic neuropathy, unspecified: Secondary | ICD-10-CM | POA: Diagnosis present

## 2023-04-01 DIAGNOSIS — K5989 Other specified functional intestinal disorders: Secondary | ICD-10-CM | POA: Diagnosis not present

## 2023-04-01 DIAGNOSIS — I69331 Monoplegia of upper limb following cerebral infarction affecting right dominant side: Secondary | ICD-10-CM | POA: Diagnosis not present

## 2023-04-01 DIAGNOSIS — N183 Chronic kidney disease, stage 3 unspecified: Secondary | ICD-10-CM | POA: Diagnosis not present

## 2023-04-01 DIAGNOSIS — I129 Hypertensive chronic kidney disease with stage 1 through stage 4 chronic kidney disease, or unspecified chronic kidney disease: Secondary | ICD-10-CM | POA: Diagnosis present

## 2023-04-01 DIAGNOSIS — Z79890 Hormone replacement therapy (postmenopausal): Secondary | ICD-10-CM | POA: Diagnosis not present

## 2023-04-01 DIAGNOSIS — N179 Acute kidney failure, unspecified: Secondary | ICD-10-CM | POA: Diagnosis present

## 2023-04-01 DIAGNOSIS — G8220 Paraplegia, unspecified: Secondary | ICD-10-CM | POA: Diagnosis present

## 2023-04-01 DIAGNOSIS — N390 Urinary tract infection, site not specified: Secondary | ICD-10-CM | POA: Diagnosis present

## 2023-04-01 DIAGNOSIS — R140 Abdominal distension (gaseous): Secondary | ICD-10-CM | POA: Diagnosis present

## 2023-04-01 DIAGNOSIS — E119 Type 2 diabetes mellitus without complications: Secondary | ICD-10-CM | POA: Diagnosis not present

## 2023-04-01 DIAGNOSIS — J90 Pleural effusion, not elsewhere classified: Secondary | ICD-10-CM | POA: Diagnosis present

## 2023-04-01 DIAGNOSIS — Z79899 Other long term (current) drug therapy: Secondary | ICD-10-CM | POA: Diagnosis not present

## 2023-04-01 DIAGNOSIS — D128 Benign neoplasm of rectum: Secondary | ICD-10-CM | POA: Diagnosis not present

## 2023-04-01 DIAGNOSIS — Z6831 Body mass index (BMI) 31.0-31.9, adult: Secondary | ICD-10-CM | POA: Diagnosis not present

## 2023-04-01 DIAGNOSIS — E039 Hypothyroidism, unspecified: Secondary | ICD-10-CM | POA: Diagnosis present

## 2023-04-01 DIAGNOSIS — K5981 Ogilvie syndrome: Principal | ICD-10-CM

## 2023-04-01 DIAGNOSIS — E785 Hyperlipidemia, unspecified: Secondary | ICD-10-CM | POA: Diagnosis present

## 2023-04-01 DIAGNOSIS — Z993 Dependence on wheelchair: Secondary | ICD-10-CM | POA: Diagnosis not present

## 2023-04-01 DIAGNOSIS — E1122 Type 2 diabetes mellitus with diabetic chronic kidney disease: Secondary | ICD-10-CM | POA: Diagnosis present

## 2023-04-01 DIAGNOSIS — E876 Hypokalemia: Secondary | ICD-10-CM | POA: Diagnosis present

## 2023-04-01 DIAGNOSIS — N1832 Chronic kidney disease, stage 3b: Secondary | ICD-10-CM | POA: Diagnosis present

## 2023-04-01 DIAGNOSIS — K6289 Other specified diseases of anus and rectum: Secondary | ICD-10-CM | POA: Diagnosis not present

## 2023-04-01 DIAGNOSIS — R935 Abnormal findings on diagnostic imaging of other abdominal regions, including retroperitoneum: Secondary | ICD-10-CM

## 2023-04-01 DIAGNOSIS — E1129 Type 2 diabetes mellitus with other diabetic kidney complication: Secondary | ICD-10-CM | POA: Diagnosis not present

## 2023-04-01 DIAGNOSIS — E669 Obesity, unspecified: Secondary | ICD-10-CM | POA: Diagnosis present

## 2023-04-01 LAB — COMPREHENSIVE METABOLIC PANEL
ALT: 20 U/L (ref 0–44)
AST: 25 U/L (ref 15–41)
Albumin: 3.2 g/dL — ABNORMAL LOW (ref 3.5–5.0)
Alkaline Phosphatase: 101 U/L (ref 38–126)
Anion gap: 12 (ref 5–15)
BUN: 25 mg/dL — ABNORMAL HIGH (ref 8–23)
CO2: 17 mmol/L — ABNORMAL LOW (ref 22–32)
Calcium: 8.7 mg/dL — ABNORMAL LOW (ref 8.9–10.3)
Chloride: 106 mmol/L (ref 98–111)
Creatinine, Ser: 1.91 mg/dL — ABNORMAL HIGH (ref 0.61–1.24)
GFR, Estimated: 37 mL/min — ABNORMAL LOW (ref >60)
Glucose, Bld: 149 mg/dL — ABNORMAL HIGH (ref 70–99)
Potassium: 3.8 mmol/L (ref 3.5–5.1)
Sodium: 135 mmol/L (ref 135–145)
Total Bilirubin: 0.8 mg/dL (ref 0.3–1.2)
Total Protein: 7.1 g/dL (ref 6.5–8.1)

## 2023-04-01 LAB — URINALYSIS, ROUTINE W REFLEX MICROSCOPIC
Bilirubin Urine: NEGATIVE
Glucose, UA: NEGATIVE mg/dL
Hgb urine dipstick: NEGATIVE
Ketones, ur: NEGATIVE mg/dL
Nitrite: POSITIVE — AB
Protein, ur: 100 mg/dL — AB
Specific Gravity, Urine: 1.012 (ref 1.005–1.030)
WBC, UA: >50 WBC/hpf (ref 0–5)
pH: 5 (ref 5.0–8.0)

## 2023-04-01 LAB — CBC WITH DIFFERENTIAL/PLATELET
Abs Immature Granulocytes: 0.1 10*3/uL — ABNORMAL HIGH (ref 0.00–0.07)
Basophils Absolute: 0.1 10*3/uL (ref 0.0–0.1)
Basophils Relative: 0 %
Eosinophils Absolute: 0.3 10*3/uL (ref 0.0–0.5)
Eosinophils Relative: 2 %
HCT: 36.7 % — ABNORMAL LOW (ref 39.0–52.0)
Hemoglobin: 11.6 g/dL — ABNORMAL LOW (ref 13.0–17.0)
Immature Granulocytes: 1 %
Lymphocytes Relative: 26 %
Lymphs Abs: 3.1 10*3/uL (ref 0.7–4.0)
MCH: 30.2 pg (ref 26.0–34.0)
MCHC: 31.6 g/dL (ref 30.0–36.0)
MCV: 95.6 fL (ref 80.0–100.0)
Monocytes Absolute: 1 10*3/uL (ref 0.1–1.0)
Monocytes Relative: 8 %
Neutro Abs: 7.4 10*3/uL (ref 1.7–7.7)
Neutrophils Relative %: 63 %
Platelets: 302 10*3/uL (ref 150–400)
RBC: 3.84 MIL/uL — ABNORMAL LOW (ref 4.22–5.81)
RDW: 14.3 % (ref 11.5–15.5)
WBC: 12 10*3/uL — ABNORMAL HIGH (ref 4.0–10.5)
nRBC: 0 % (ref 0.0–0.2)

## 2023-04-01 LAB — PROTIME-INR
INR: 1.1 (ref 0.8–1.2)
Prothrombin Time: 14.2 seconds (ref 11.4–15.2)

## 2023-04-01 LAB — LIPASE, BLOOD: Lipase: 35 U/L (ref 11–51)

## 2023-04-01 LAB — BRAIN NATRIURETIC PEPTIDE: B Natriuretic Peptide: 36.5 pg/mL (ref 0.0–100.0)

## 2023-04-01 LAB — TROPONIN I (HIGH SENSITIVITY)
Troponin I (High Sensitivity): 11 ng/L (ref <18)
Troponin I (High Sensitivity): 11 ng/L (ref <18)

## 2023-04-01 LAB — AMMONIA: Ammonia: 35 umol/L (ref 9–35)

## 2023-04-01 MED ORDER — ATORVASTATIN CALCIUM 10 MG PO TABS
20.0000 mg | ORAL_TABLET | Freq: Every day | ORAL | Status: DC
  Administered 2023-04-01 – 2023-04-10 (×10): 20 mg via ORAL
  Filled 2023-04-01 (×10): qty 2

## 2023-04-01 MED ORDER — TRAZODONE HCL 50 MG PO TABS: 50.0000 mg | ORAL_TABLET | Freq: Every evening | ORAL | Status: DC | PRN

## 2023-04-01 MED ORDER — ACETAMINOPHEN 325 MG PO TABS: 650.0000 mg | ORAL_TABLET | Freq: Four times a day (QID) | ORAL | Status: DC | PRN

## 2023-04-01 MED ORDER — ONDANSETRON HCL 4 MG/2ML IJ SOLN: 4.0000 mg | Freq: Four times a day (QID) | INTRAMUSCULAR | Status: DC | PRN

## 2023-04-01 MED ORDER — DOCUSATE SODIUM 100 MG PO CAPS
100.0000 mg | ORAL_CAPSULE | Freq: Two times a day (BID) | ORAL | Status: DC
  Administered 2023-04-01 – 2023-04-11 (×15): 100 mg via ORAL
  Filled 2023-04-01 (×16): qty 1

## 2023-04-01 MED ORDER — ENOXAPARIN SODIUM 40 MG/0.4ML IJ SOSY
40.0000 mg | PREFILLED_SYRINGE | INTRAMUSCULAR | Status: DC
  Administered 2023-04-02 – 2023-04-10 (×9): 40 mg via SUBCUTANEOUS
  Filled 2023-04-01 (×9): qty 0.4

## 2023-04-01 MED ORDER — CHLORHEXIDINE GLUCONATE CLOTH 2 % EX PADS
6.0000 | MEDICATED_PAD | Freq: Every day | CUTANEOUS | Status: DC
  Administered 2023-04-03 – 2023-04-05 (×3): 6 via TOPICAL

## 2023-04-01 MED ORDER — ENOXAPARIN SODIUM 40 MG/0.4ML IJ SOSY
40.0000 mg | PREFILLED_SYRINGE | INTRAMUSCULAR | Status: DC
  Administered 2023-04-01: 40 mg via SUBCUTANEOUS
  Filled 2023-04-01: qty 0.4

## 2023-04-01 MED ORDER — MORPHINE SULFATE (PF) 2 MG/ML IV SOLN: 2.0000 mg | INTRAVENOUS | Status: DC | PRN

## 2023-04-01 MED ORDER — ONDANSETRON HCL 4 MG PO TABS: 4.0000 mg | ORAL_TABLET | Freq: Four times a day (QID) | ORAL | Status: DC | PRN

## 2023-04-01 MED ORDER — AMLODIPINE BESYLATE 5 MG PO TABS
5.0000 mg | ORAL_TABLET | Freq: Every day | ORAL | Status: DC
  Administered 2023-04-01 – 2023-04-11 (×11): 5 mg via ORAL
  Filled 2023-04-01 (×11): qty 1

## 2023-04-01 MED ORDER — ALLOPURINOL 100 MG PO TABS
100.0000 mg | ORAL_TABLET | Freq: Every day | ORAL | Status: DC
  Administered 2023-04-01 – 2023-04-11 (×11): 100 mg via ORAL
  Filled 2023-04-01 (×11): qty 1

## 2023-04-01 MED ORDER — ACETAMINOPHEN 650 MG RE SUPP: 650.0000 mg | Freq: Four times a day (QID) | RECTAL | Status: DC | PRN

## 2023-04-01 MED ORDER — SODIUM BICARBONATE 650 MG PO TABS
1300.0000 mg | ORAL_TABLET | Freq: Three times a day (TID) | ORAL | Status: DC
  Administered 2023-04-01 – 2023-04-11 (×31): 1300 mg via ORAL
  Filled 2023-04-01 (×31): qty 2

## 2023-04-01 MED ORDER — HYDRALAZINE HCL 20 MG/ML IJ SOLN: 5.0000 mg | INTRAMUSCULAR | Status: DC | PRN

## 2023-04-01 MED ORDER — BISACODYL 5 MG PO TBEC
5.0000 mg | DELAYED_RELEASE_TABLET | Freq: Every day | ORAL | Status: DC | PRN
  Administered 2023-04-11: 5 mg via ORAL
  Filled 2023-04-01: qty 1

## 2023-04-01 MED ORDER — OXYCODONE HCL 5 MG PO TABS
5.0000 mg | ORAL_TABLET | Freq: Three times a day (TID) | ORAL | Status: DC | PRN
  Administered 2023-04-04 – 2023-04-08 (×2): 5 mg via ORAL
  Filled 2023-04-01 (×2): qty 1

## 2023-04-01 MED ORDER — IOHEXOL 350 MG/ML SOLN
60.0000 mL | Freq: Once | INTRAVENOUS | Status: AC | PRN
  Administered 2023-04-01: 60 mL via INTRAVENOUS

## 2023-04-01 MED ORDER — SIMETHICONE 40 MG/0.6ML PO SUSP
40.0000 mg | Freq: Once | ORAL | Status: AC
  Administered 2023-04-01: 40 mg via ORAL
  Filled 2023-04-01: qty 0.6

## 2023-04-01 MED ORDER — SODIUM CHLORIDE 0.9 % IV SOLN
2.0000 g | Freq: Once | INTRAVENOUS | Status: AC
  Administered 2023-04-01: 2 g via INTRAVENOUS
  Filled 2023-04-01: qty 20

## 2023-04-01 MED ORDER — LACTATED RINGERS IV SOLN: INTRAVENOUS | Status: DC

## 2023-04-01 MED ORDER — ENOXAPARIN SODIUM 40 MG/0.4ML IJ SOSY: 40.0000 mg | PREFILLED_SYRINGE | INTRAMUSCULAR | Status: DC

## 2023-04-01 MED ORDER — POTASSIUM CHLORIDE CRYS ER 20 MEQ PO TBCR
40.0000 meq | EXTENDED_RELEASE_TABLET | Freq: Two times a day (BID) | ORAL | Status: DC
  Administered 2023-04-01 – 2023-04-10 (×19): 40 meq via ORAL
  Filled 2023-04-01 (×19): qty 2

## 2023-04-01 MED ORDER — SODIUM CHLORIDE 0.9 % IV SOLN
1.0000 g | INTRAVENOUS | Status: AC
  Administered 2023-04-02 – 2023-04-08 (×7): 1 g via INTRAVENOUS
  Filled 2023-04-01 (×7): qty 10

## 2023-04-01 MED ORDER — LEVOTHYROXINE SODIUM 50 MCG PO TABS
50.0000 ug | ORAL_TABLET | Freq: Every day | ORAL | Status: DC
  Administered 2023-04-01 – 2023-04-11 (×10): 50 ug via ORAL
  Filled 2023-04-01 (×4): qty 1
  Filled 2023-04-01: qty 2
  Filled 2023-04-01 (×6): qty 1

## 2023-04-01 MED ORDER — POLYETHYLENE GLYCOL 3350 17 G PO PACK: 17.0000 g | PACK | Freq: Every day | ORAL | Status: DC | PRN

## 2023-04-01 NOTE — ED Provider Notes (Signed)
[place] EMERGENCY DEPARTMENT AT [facility] Provider Note   CSN: [id number] Arrival date & time: 03/31/23  2348     History  Chief Complaint  Patient presents with   Fluid Retention    Terry Nine Sr. is a 66 y.o. male.  67 year old male who presents ER today with abdominal distention.  On etiology.  Last bowel movement was on the night.  Is unsure when he last passed gas but he states it could have been earlier today or a few weeks ago he would really know.  He is generally in a wheelchair.  No shortness of breath or really severe pain just pressure.  No fevers.  No nausea or vomiting.  States        Home Medications Prior to Admission medications   Medication Sig Start Date End Date Taking? Authorizing Provider  amLODipine (NORVASC) 5 MG tablet Take 1 tablet (5 mg total) by mouth daily. 06/08/22   Rolly Salter, MD  atorvastatin (LIPITOR) 20 MG tablet Take 20 mg by mouth at bedtime.    [provider]  Cholecalciferol (VITAMIN D3) 50 MCG (2000 UT) TABS Take 2,000 Units by mouth daily.    [provider]  docusate sodium (COLACE) 100 MG capsule Take 1 capsule (100 mg total) by mouth 2 (two) times daily. 06/08/22 06/08/23  Rolly Salter, MD  doxycycline (VIBRA-TABS) 100 MG tablet Take 1 tablet (100 mg total) by mouth 2 (two) times daily. 11/17/22   Vivi Barrack, DPM  levothyroxine (SYNTHROID) 50 MCG tablet Take 50 mcg by mouth.    [provider]  mirtazapine (REMERON) 7.5 MG tablet Take 7.5 mg by mouth at bedtime.    [provider]  Multiple Vitamins-Minerals (CERTAVITE/ANTIOXIDANTS PO) Take 1 tablet by mouth daily.    [provider]  Nutritional Supplements (NEPRO) LIQD Take 1 each by mouth in the morning and at bedtime. 06/08/22   Rolly Salter, MD  oxyCODONE (OXY IR/ROXICODONE) 5 MG immediate release tablet Take 1 tablet (5 mg total) by mouth 3 (three) times daily as needed for moderate pain or severe pain.  06/08/22   Rolly Salter, MD  polyethylene glycol (MIRALAX) 17 g packet Take 17 g by mouth daily. 06/08/22   Rolly Salter, MD  potassium chloride SA (KLOR-CON M) 20 MEQ tablet Take 40 mEq by mouth 2 (two) times daily. 08/20/22   [provider]  sodium bicarbonate 650 MG tablet Take 2 tablets (1,300 mg total) by mouth 3 (three) times daily. 06/08/22   Rolly Salter, MD  traZODone (DESYREL) 50 MG tablet Take 1 tablet (50 mg total) by mouth at bedtime as needed for sleep. 06/01/20   Leroy Sea, MD      Allergies    Patient has no known allergies.    Review of Systems   Review of Systems  Physical Exam Updated Vital Signs BP (!) 159/80   Pulse 61   Temp 97.8 F (36.6 C) (Oral)   Resp 15   Ht 6' (1.829 m)   Wt 104.3 kg   SpO2 100%   BMI 31.19 kg/m  Physical Exam Vitals and nursing note reviewed.  Constitutional:      Appearance: He is well-developed.  HENT:     Head: Normocephalic and atraumatic.  Eyes:     Pupils: Pupils are equal, round, and reactive to light.  Cardiovascular:     Rate and Rhythm: Normal rate.  Pulmonary:  Effort: Pulmonary effort is normal. No respiratory distress.  Abdominal:     General: Abdomen is flat. There is distension.  Musculoskeletal:        General: Normal range of motion.     Cervical back: Normal range of motion.  Skin:    General: Skin is warm.  Neurological:     General: No focal deficit present.     Mental Status: He is alert.     ED Results / Procedures / Treatments   Labs (all labs ordered are listed, but only abnormal results are displayed) Labs Reviewed  CBC WITH DIFFERENTIAL/PLATELET - Abnormal; Notable for the following components:      Result Value   WBC 12.0 (*)    RBC 3.84 (*)    Hemoglobin 11.6 (*)    HCT 36.7 (*)    Abs Immature Granulocytes 0.10 (*)    All other components within normal limits  COMPREHENSIVE METABOLIC PANEL - Abnormal; Notable for the following components:   CO2 17 (*)     Glucose, Bld 149 (*)    BUN 25 (*)    Creatinine, Ser 1.91 (*)    Calcium 8.7 (*)    Albumin 3.2 (*)    GFR, Estimated 37 (*)    All other components within normal limits  URINALYSIS, ROUTINE W REFLEX MICROSCOPIC - Abnormal; Notable for the following components:   APPearance HAZY (*)    Protein, ur 100 (*)    Nitrite POSITIVE (*)    Leukocytes,Ua MODERATE (*)    Bacteria, UA MANY (*)    All other components within normal limits  URINE CULTURE  LIPASE, BLOOD  BRAIN NATRIURETIC PEPTIDE  PROTIME-INR  AMMONIA  TROPONIN I (HIGH SENSITIVITY)  TROPONIN I (HIGH SENSITIVITY)    EKG None  Radiology CT ABDOMEN PELVIS W CONTRAST  Result Date: 04/01/2023 CLINICAL DATA:  Distended abdomen, pitting edema in bilateral lower extremities. EXAM: CT ABDOMEN AND PELVIS WITH CONTRAST TECHNIQUE: Multidetector CT imaging of the abdomen and pelvis was performed using the standard protocol following bolus administration of intravenous contrast. RADIATION DOSE REDUCTION: This exam was performed according to the departmental dose-optimization program which includes automated exposure control, adjustment of the mA and/or kV according to patient size and/or use of iterative reconstruction technique. CONTRAST:  60mL OMNIPAQUE IOHEXOL 350 MG/ML SOLN COMPARISON:  03/29/2022. FINDINGS: Lower chest: There is a small to moderate right pleural effusion with compressive atelectasis in the right lower lobe. Mild atelectasis or scarring is present at the left lung base. Multi-vessel coronary artery calcifications are noted. Hepatobiliary: No focal abnormality in the liver. No biliary ductal dilation. Stones and hyperdense material are noted in the gallbladder. Pancreas: Unremarkable. No pancreatic ductal dilatation or surrounding inflammatory changes. Spleen: Normal in size without focal abnormality. Adrenals/Urinary Tract: Adrenal glands are within normal limits. Renal atrophy is noted bilaterally. The kidneys enhance  symmetrically. There is a cyst in the lower pole of the right kidney. Subcentimeter hypodensity is noted in the left kidney which is too small to further characterize. No renal calculus or hydronephrosis. The bladder is unremarkable. Stomach/Bowel: There is a small hiatal hernia. The stomach is within normal limits. Multiple distended gas-filled loops of transverse colon, descending colon and sigmoid colon without evidence of obstruction. The appendix is within normal limits. The small bowel is normal in caliber. There is mild rectal wall thickening with perirectal fat stranding. Vascular/Lymphatic: No significant vascular findings are present. No enlarged abdominal or pelvic lymph nodes. Reproductive: Prostate gland is mildly enlarged.  Other: No abdominopelvic ascites. Musculoskeletal: Degenerative changes are present in the thoracolumbar spine. No acute osseous abnormality. IMPRESSION: 1. Multiple distended loops gas-filled transverse, descending, and sigmoid colon without evidence of obstruction. 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 2. Small to moderate right pleural effusion with atelectasis at the lung bases. 3. Cholelithiasis. 4. Small hiatal hernia. 5. Aortic atherosclerosis and coronary artery calcifications. Electronically Signed   By: Thornell Sartorius M.D.   On: 04/01/2023 02:38   DG Chest Portable 1 View  Result Date: 04/01/2023 CLINICAL DATA:  Evaluation for fluid retention. EXAM: PORTABLE CHEST 1 VIEW COMPARISON:  March 29, 2022 FINDINGS: The heart size and mediastinal contours are within normal limits. Low lung volumes are noted. There is no evidence of focal consolidation. A small layering right pleural effusion is suspected. No pneumothorax is identified. Air-filled loops of distended bowel are suspected within the visualized portion of the upper abdomen. Multilevel degenerative changes are seen throughout the thoracic spine. IMPRESSION: 1. Low lung  volumes with a small layering right pleural effusion. 2. A small bowel obstruction versus ileus cannot be excluded. Further correlation with abdomen pelvis CT is recommended. Electronically Signed   By: Aram Candela M.D.   On: 04/01/2023 01:07    Procedures Procedures    Medications Ordered in ED Medications  cefTRIAXone (ROCEPHIN) 2 g in sodium chloride 0.9 % 100 mL IVPB (has no administration in time range)  iohexol (OMNIPAQUE) 350 MG/ML injection 60 mL (60 mLs Intravenous Contrast Given 04/01/23 0219)  simethicone (MYLICON) 40 MG/0.6ML suspension 40 mg (40 mg Oral Given 04/01/23 0403)    ED Course/ Medical Decision Making/ A&P                             Medical Decision Making Amount and/or Complexity of Data Reviewed Labs: ordered. Radiology: ordered.  Risk OTC drugs. Prescription drug management. Decision regarding hospitalization.   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  D/w Dr. Donell Beers with surgery, states usually GI/medicine management. Consulted GI, Dr. Chales Abrahams, per institutional protocol and will see in AM. D/w Dr. Joneen Roach, will admit.  Distended urinary bladder on CT scan as well so catheter placed. Appears to have UTI. Culture added, rocephin started.   Final Clinical Impression(s) / ED Diagnoses Final diagnoses:  Ogilvie's syndrome    Rx / DC Orders ED Discharge Orders     None         Kalii Chesmore, Barbara Cower, MD 04/01/23 [phone]

## 2023-04-01 NOTE — Anesthesia Preprocedure Evaluation (Signed)
Anesthesia Evaluation  Patient identified by MRN, date of birth, ID band Patient awake    Reviewed: Allergy & Precautions, NPO status , Patient's Chart, lab work & pertinent test results  History of Anesthesia Complications Negative for: history of anesthetic complications  Airway Mallampati: II  TM Distance: >3 FB Neck ROM: Full    Dental  (+) Edentulous Upper, Edentulous Lower   Pulmonary neg pulmonary ROS   Pulmonary exam normal        Cardiovascular hypertension, Pt. 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  Reproductive/Obstetrics                              Anesthesia Physical Anesthesia Plan  ASA: 3  Anesthesia Plan: MAC   Post-op Pain Management: Minimal or no pain anticipated   Induction:   PONV Risk Score and Plan: 1 and Propofol infusion and Treatment may vary due to age or medical condition  Airway Management Planned: Natural Airway and Nasal Cannula  Additional Equipment: None  Intra-op Plan:   Post-operative Plan:   Informed Consent: I have reviewed the patients History and Physical, chart, labs and discussed the procedure including the risks, benefits and alternatives for the proposed anesthesia with the patient or authorized representative who has indicated his/her understanding and acceptance.       Plan Discussed with: CRNA  Anesthesia Plan Comments:         Anesthesia Quick  Evaluation

## 2023-04-01 NOTE — H&P (View-Only) (Signed)
Consultation  Referring Provider:  [facility]  Primary Care Physician:  Loura Back, NP Primary Gastroenterologist:  Gentry Fitz       Reason for Consultation: Ogilvie's Syndrome    LOS: 0 days          HPI:   Terry Gallentine Sr. is a 67 y.o. male with past medical history significant for ICH d/t HTN 2021, DM, CKD, hypothyroidism, presents for evaluation of abdominal distention.  Patient states Monday 4/22 he noticed his abdomen became significantly distended and uncomfortable (denies pain). Reports Sunday he felt normal and had normal bowel movements without melena or hematochezia. Sunday was his last BM. States he unsure of the last time he passed gas. Denies shortness of breath, chest pain, weakness. Feels abdominal pressure but no pain. Denies nausea/vomiting.  States he thinks his last colonoscopy was within the last year in [place] (no reports in [place]). Unsure where it was done or what it showed. Denies NSAID use, tobacco use, alcohol use. States he is typically in a wheelchair.  CT abdomen pelvis with contrast showed multiple distended loops gas-filled transverse, descending, and sigmoid colon without evidence of obstruction.  Mild wall thickening and perirectal fat stranding noted (possible proctitis), cholelithiasis  No family was present at the time of my evaluation.  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 Family History:  His Family history is unknown by patient. Social History:   reports that he has never smoked. He has never used  smokeless tobacco. He reports that he does not currently use alcohol. He reports that he does not currently use drugs.  Prior to Admission medications   Medication Sig Start Date End Date Taking? Authorizing Provider  allopurinol (ZYLOPRIM) 100 MG tablet Take 100 mg by mouth daily. 03/02/23  Yes [provider]  amLODipine (NORVASC) 5 MG tablet Take 1 tablet (5 mg total) by mouth daily. 06/08/22  Yes Rolly Salter, MD  atorvastatin (LIPITOR) 20 MG tablet Take 20 mg by mouth at bedtime.   Yes [provider]  levothyroxine (SYNTHROID) 50 MCG tablet Take 50 mcg by mouth.   Yes [provider]  oxyCODONE (OXY IR/ROXICODONE) 5 MG immediate release tablet Take 1 tablet (5 mg total) by mouth 3 (three) times daily as needed for moderate pain or severe pain. 06/08/22  Yes Rolly Salter, MD  potassium chloride SA (KLOR-CON M) 20 MEQ tablet Take 40 mEq by mouth 2 (two) times daily. 08/20/22  Yes [provider]  sodium bicarbonate 650 MG tablet Take 2 tablets (1,300 mg total) by mouth 3 (three) times daily. 06/08/22  Yes Rolly Salter, MD  traZODone (DESYREL) 50 MG tablet Take 1 tablet (50 mg total) by mouth at bedtime as needed for sleep. 06/01/20  Yes Leroy Sea, MD    Current Facility-Administered Medications  Medication Dose Route Frequency Provider Last Rate Last Admin   acetaminophen (TYLENOL) tablet 650 mg  650  mg Oral Q6H PRN Jonah Blue, MD       Or   acetaminophen (TYLENOL) suppository 650 mg  650 mg Rectal Q6H PRN Jonah Blue, MD       allopurinol (ZYLOPRIM) tablet 100 mg  100 mg Oral Daily Jonah Blue, MD   100 mg at 04/01/23 0946   amLODipine (NORVASC) tablet 5 mg  5 mg Oral Daily Jonah Blue, MD   5 mg at 04/01/23 0946   atorvastatin (LIPITOR) tablet 20 mg  20 mg Oral Noemi Chapel, MD       bisacodyl (DULCOLAX) EC tablet 5 mg  5 mg Oral Daily PRN Jonah Blue, MD       docusate sodium (COLACE) capsule 100 mg  100 mg Oral BID  Jonah Blue, MD   100 mg at 04/01/23 0947   enoxaparin (LOVENOX) injection 40 mg  40 mg Subcutaneous Q24H Jonah Blue, MD   40 mg at 04/01/23 0950   hydrALAZINE (APRESOLINE) injection 5 mg  5 mg Intravenous Q4H PRN Jonah Blue, MD       lactated ringers infusion   Intravenous Continuous Jonah Blue, MD 75 mL/hr at 04/01/23 0953 New Bag at 04/01/23 0953   levothyroxine (SYNTHROID) tablet 50 mcg  50 mcg Oral Q0600 Jonah Blue, MD   50 mcg at 04/01/23 0947   morphine (PF) 2 MG/ML injection 2 mg  2 mg Intravenous Q2H PRN Jonah Blue, MD       ondansetron [facility]) tablet 4 mg  4 mg Oral Q6H PRN Jonah Blue, MD       Or   ondansetron [facility]) injection 4 mg  4 mg Intravenous Q6H PRN Jonah Blue, MD       oxyCODONE (Oxy IR/ROXICODONE) immediate release tablet 5 mg  5 mg Oral TID PRN Jonah Blue, MD       polyethylene glycol (MIRALAX / GLYCOLAX) packet 17 g  17 g Oral Daily PRN Jonah Blue, MD       potassium chloride SA (KLOR-CON M) CR tablet 40 mEq  40 mEq Oral BID Jonah Blue, MD   40 mEq at 04/01/23 1610   sodium bicarbonate tablet 1,300 mg  1,300 mg Oral TID Jonah Blue, MD   1,300 mg at 04/01/23 0947   traZODone (DESYREL) tablet 50 mg  50 mg Oral QHS PRN Jonah Blue, MD       Current Outpatient Medications  Medication Sig Dispense Refill   allopurinol (ZYLOPRIM) 100 MG tablet Take 100 mg by mouth daily.     amLODipine (NORVASC) 5 MG tablet Take 1 tablet (5 mg total) by mouth daily. 30 tablet 0   atorvastatin (LIPITOR) 20 MG tablet Take 20 mg by mouth at bedtime.     levothyroxine (SYNTHROID) 50 MCG tablet Take 50 mcg by mouth.     oxyCODONE (OXY IR/ROXICODONE) 5 MG immediate release tablet Take 1 tablet (5 mg total) by mouth 3 (three) times daily as needed for moderate pain or severe pain. 15 tablet 0   potassium chloride SA (KLOR-CON M) 20 MEQ tablet Take 40 mEq by mouth 2 (two) times daily.     sodium bicarbonate 650 MG tablet Take 2 tablets  (1,300 mg total) by mouth 3 (three) times daily. 120 tablet 0   traZODone (DESYREL) 50 MG tablet Take 1 tablet (50 mg total) by mouth at bedtime as needed for sleep. 30 tablet 0    Allergies as of 03/31/2023   (No Known Allergies)    Review of Systems  Constitutional:  Negative for chills, fever and weight loss.  HENT:  Negative for hearing loss and tinnitus.   Eyes:  Negative for blurred vision and double vision.  Respiratory: Negative.    Cardiovascular: Negative.  Negative for chest pain and palpitations.  Gastrointestinal:  Negative for abdominal pain, blood in stool, constipation, diarrhea, heartburn, melena, nausea and vomiting.       Bloating/distention  Genitourinary:  Negative for dysuria and urgency.  Musculoskeletal:  Negative for myalgias and neck pain.  Skin:  Negative for itching and rash.  Neurological:  Negative for seizures and loss of consciousness.  Psychiatric/Behavioral:  Negative for depression and suicidal ideas.        Physical Exam:  Vital signs in last 24 hours: Temp:  [97.8 F (36.6 C)-98.6 F (37 C)] 98.6 F (37 C) (04/24 0926) Pulse Rate:  [60-72] 63 (04/24 1000) Resp:  [11-23] 15 (04/24 1000) BP: (135-189)/(73-124) 166/83 (04/24 1000) SpO2:  [100 %] 100 % (04/24 1000) Weight:  [104.3 kg] 104.3 kg (04/23 2353)   Last BM recorded by nurses in past 5 days No data recorded  Physical Exam Constitutional:      General: He is not in acute distress.    Appearance: He is obese.  HENT:     Head: Normocephalic and atraumatic.     Nose: Nose normal. No congestion.     Mouth/Throat:     Mouth: Mucous membranes are moist.     Pharynx: Oropharynx is clear.  Eyes:     Extraocular Movements: Extraocular movements intact.     Conjunctiva/sclera: Conjunctivae normal.  Cardiovascular:     Rate and Rhythm: Normal rate and regular rhythm.  Pulmonary:     Effort: Pulmonary effort is normal. No respiratory distress.  Abdominal:     Tenderness: There is no  abdominal tenderness. There is no guarding.     Comments: Taught abdomen, significantly distended, normal bowel sounds. Nontender.  Musculoskeletal:     Cervical back: Normal range of motion and neck supple.     Right lower leg: Edema present.     Left lower leg: Edema present.  Skin:    General: Skin is warm and dry.  Neurological:     General: No focal deficit present.     Mental Status: He is oriented to person, place, and time.  Psychiatric:        Mood and Affect: Mood normal.        Behavior: Behavior normal.        Thought Content: Thought content normal.        Judgment: Judgment normal.      LAB RESULTS: Recent Labs    04/01/23 0048  WBC 12.0*  HGB 11.6*  HCT 36.7*  PLT 302   BMET Recent Labs    04/01/23 0048  NA 135  K 3.8  CL 106  CO2 17*  GLUCOSE 149*  BUN 25*  CREATININE 1.91*  CALCIUM 8.7*   LFT Recent Labs    04/01/23 0048  PROT 7.1  ALBUMIN 3.2*  AST 25  ALT 20  ALKPHOS 101  BILITOT 0.8   PT/INR Recent Labs    04/01/23 0048  LABPROT 14.2  INR 1.1    STUDIES: CT ABDOMEN PELVIS W CONTRAST  Result Date: 04/01/2023 CLINICAL DATA:  Distended abdomen, pitting edema in bilateral lower extremities. EXAM: CT ABDOMEN AND PELVIS WITH CONTRAST TECHNIQUE: Multidetector CT imaging of the abdomen and pelvis was performed using the standard protocol following bolus administration of  intravenous contrast. RADIATION DOSE REDUCTION: This exam was performed according to the departmental dose-optimization program which includes automated exposure control, adjustment of the mA and/or kV according to patient size and/or use of iterative reconstruction technique. CONTRAST:  60mL OMNIPAQUE IOHEXOL 350 MG/ML SOLN COMPARISON:  03/29/2022. FINDINGS: Lower chest: There is a small to moderate right pleural effusion with compressive atelectasis in the right lower lobe. Mild atelectasis or scarring is present at the left lung base. Multi-vessel coronary artery  calcifications are noted. Hepatobiliary: No focal abnormality in the liver. No biliary ductal dilation. Stones and hyperdense material are noted in the gallbladder. Pancreas: Unremarkable. No pancreatic ductal dilatation or surrounding inflammatory changes. Spleen: Normal in size without focal abnormality. Adrenals/Urinary Tract: Adrenal glands are within normal limits. Renal atrophy is noted bilaterally. The kidneys enhance symmetrically. There is a cyst in the lower pole of the right kidney. Subcentimeter hypodensity is noted in the left kidney which is too small to further characterize. No renal calculus or hydronephrosis. The bladder is unremarkable. Stomach/Bowel: There is a small hiatal hernia. The stomach is within normal limits. Multiple distended gas-filled loops of transverse colon, descending colon and sigmoid colon without evidence of obstruction. The appendix is within normal limits. The small bowel is normal in caliber. There is mild rectal wall thickening with perirectal fat stranding. Vascular/Lymphatic: No significant vascular findings are present. No enlarged abdominal or pelvic lymph nodes. Reproductive: Prostate gland is mildly enlarged. Other: No abdominopelvic ascites. Musculoskeletal: Degenerative changes are present in the thoracolumbar spine. No acute osseous abnormality. IMPRESSION: 1. Multiple distended loops gas-filled transverse, descending, and sigmoid colon without evidence of obstruction. 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 2. Small to moderate right pleural effusion with atelectasis at the lung bases. 3. Cholelithiasis. 4. Small hiatal hernia. 5. Aortic atherosclerosis and coronary artery calcifications. Electronically Signed   By: Thornell Sartorius M.D.   On: 04/01/2023 02:38   DG Chest Portable 1 View  Result Date: 04/01/2023 CLINICAL DATA:  Evaluation for fluid retention. EXAM: PORTABLE CHEST 1 VIEW COMPARISON:  March 29, 2022  FINDINGS: The heart size and mediastinal contours are within normal limits. Low lung volumes are noted. There is no evidence of focal consolidation. A small layering right pleural effusion is suspected. No pneumothorax is identified. Air-filled loops of distended bowel are suspected within the visualized portion of the upper abdomen. Multilevel degenerative changes are seen throughout the thoracic spine. IMPRESSION: 1. Low lung volumes with a small layering right pleural effusion. 2. A small bowel obstruction versus ileus cannot be excluded. Further correlation with abdomen pelvis CT is recommended. Electronically Signed   By: Aram Candela M.D.   On: 04/01/2023 01:07      Impression    Ogilvie's syndrome CT abdomen pelvis with contrast showed multiple distended loops gas-filled transverse, descending, and sigmoid colon without evidence of obstruction.  Mild wall thickening and perirectal fat stranding noted (possible proctitis), cholelithiasis WBC 12.0 Hgb 11.6 Less inclined to have patient undergo decompression at this time with stable presentation. Will need flex sig at some point to evaluate possible proctitis. Would prefer to get better idea of measurements of dilated colon prior to considering decompression.  CKD -BUN 25, creatinine 1.91, GFR 37   Plan   - Discussed with radiology reading room to add addendum to CT report to include measurements of colonic distention, await these results - Consider Dulcolax suppository - Continue supportive care - Continue to maintain magnesium above 2 and potassium at  4-4.5.  - Pending CT results, will establish timing of possible flex sig to evaluate proctitis  ADDENDUM: - Planning for flex sig tomorrow. Can have clears today as tolerated. NPO midnight. - Enema prep - Hold AM lovenox  Thank you for your kind consultation, we will continue to follow.  Terry Barton Leanna Sato  04/01/2023, 11:01 AM

## 2023-04-01 NOTE — ED Notes (Signed)
ED TO INPATIENT HANDOFF REPORT  ED Nurse Name and Phone #: Delice Bison, RN  S Name/Age/Gender Terry Revere Sr. 67 y.o. male Room/Bed: 039C/039C  Code Status   Code Status: Full Code  Home/SNF/Other Home Patient oriented to: self, place, time, and situation Is this baseline? Yes   Triage Complete: Triage complete  Chief Complaint Abdominal distention [R14.0]  Triage Note Pt arrived via [facility] for fluid retention from home. Pt family called EMS for transport to the hospital after increasing swelling in abdomen (distended, but not tender) and pitting edema in bilateral lower extremities. PMH renal disease, not on diuretics or dialysis. Pt denies any other symptoms - no chest pain, shortness of breath, n/v/d. PPMH stroke with right sided deficits, bed bound. GCS 15, A&Ox4.  PTA EMS Vitals  BP 164/88 HR 74 NS SPO2 99% RA RR 18 CBG 186   Allergies No Known Allergies  Level of Care/Admitting Diagnosis ED Disposition     ED Disposition  Admit   Condition  --   Comment  Hospital Area: [facility] [[id number]]  Level of Care: Med-Surg [16]  May admit patient to [facility] or [facility] if equivalent level of care is available:: Yes  Covid Evaluation: Asymptomatic - no recent exposure (last 10 days) testing not required  Diagnosis: Abdominal distention [811914]  Admitting Physician: Jonah Blue [[id number]]  Attending Physician: Jonah Blue [[id number]]  Certification:: I certify this patient will need inpatient services for at least 2 midnights  Estimated Length of Stay: 3          B Medical/Surgery History Past Medical History:  Diagnosis Date   Anemia    Bedbound    uses wheel chair   Chronic kidney disease    stage 3   Diabetes mellitus without complication    no meds   HLD (hyperlipidemia)    Hypertension    Hypothyroidism    Intracranial bleeding    Intracranial bleeding    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 Terry Double-lumen 16 Fr. 04/01/23  0528  Double-lumen  less than 1   Incision (Closed) 06/03/22 Foot Left 06/03/22  1853  -- 302   Pressure Injury 03/30/22 Sacrum Medial Unstageable - Full thickness tissue loss in which the base of the injury is covered by slough (yellow, tan, gray, green or brown) and/or eschar (tan, brown or black) in the wound bed. 03/30/22  0819  -- 367            Intake/Output Last 24 hours No intake or output data in the 24 hours ending 04/01/23 1008  Labs/Imaging Results for orders placed or performed during the hospital encounter of 03/31/23 (from the past 48 hour(s))  CBC with Differential     Status: Abnormal   Collection Time: 04/01/23 12:48 AM  Result Value Ref  Range   WBC 12.0 (H) 4.0 - 10.5 K/uL   RBC 3.84 (L) 4.22 - 5.81 MIL/uL   Hemoglobin 11.6 (L) 13.0 - 17.0 g/dL   HCT 40.9 (L) 81.1 - 91.4 %   MCV 95.6 80.0 - 100.0 fL   MCH 30.2 26.0 - 34.0 pg   MCHC 31.6 30.0 - 36.0 g/dL   RDW 78.2 95.6 - 21.3 %   Platelets 302 150 - 400 K/uL   nRBC 0.0 0.0 - 0.2 %   Neutrophils Relative % 63 %   Neutro Abs 7.4 1.7 - 7.7 K/uL   Lymphocytes Relative 26 %   Lymphs Abs 3.1 0.7 - 4.0 K/uL   Monocytes Relative 8 %   Monocytes Absolute 1.0 0.1 - 1.0 K/uL   Eosinophils Relative 2 %   Eosinophils Absolute 0.3 0.0 - 0.5 K/uL   Basophils Relative 0 %   Basophils Absolute 0.1 0.0 - 0.1 K/uL   Immature Granulocytes 1 %   Abs Immature  Granulocytes 0.10 (H) 0.00 - 0.07 K/uL    Comment: Performed at [facility], [address], [place], [place] [address]  Comprehensive metabolic panel     Status: Abnormal   Collection Time: 04/01/23 12:48 AM  Result Value Ref Range   Sodium 135 135 - 145 mmol/L   Potassium 3.8 3.5 - 5.1 mmol/L   Chloride 106 98 - 111 mmol/L   CO2 17 (L) 22 - 32 mmol/L   Glucose, Bld 149 (H) 70 - 99 mg/dL    Comment: Glucose reference range applies only to samples taken after fasting for at least 8 hours.   BUN 25 (H) 8 - 23 mg/dL   Creatinine, Ser 8.46 (H) 0.61 - 1.24 mg/dL   Calcium 8.7 (L) 8.9 - 10.3 mg/dL   Total Protein 7.1 6.5 - 8.1 g/dL   Albumin 3.2 (L) 3.5 - 5.0 g/dL   AST 25 15 - 41 U/L   ALT 20 0 - 44 U/L   Alkaline Phosphatase 101 38 - 126 U/L   Total Bilirubin 0.8 0.3 - 1.2 mg/dL   GFR, Estimated 37 (L) >60 mL/min    Comment: (NOTE) Calculated using the CKD-EPI Creatinine Equation (2021)    Anion gap 12 5 - 15    Comment: Performed at [facility], [address] [place], [place] [address]  Lipase, blood     Status: None   Collection Time: 04/01/23 12:48 AM  Result Value Ref Range   Lipase 35 11 - 51 U/L    Comment: Performed at [facility], [address], [place], [place] [address]  Brain natriuretic peptide     Status: None   Collection Time: 04/01/23 12:48 AM  Result Value Ref Range   B Natriuretic Peptide 36.5 0.0 - 100.0 pg/mL    Comment: Performed at [facility], [address] [place], [place] [address]  Troponin I (High Sensitivity)     Status: None   Collection Time: 04/01/23 12:48 AM  Result Value Ref Range   Troponin I (High Sensitivity) 11 <18 ng/L    Comment: (NOTE) Elevated high sensitivity troponin I (hsTnI) values and significant  changes across serial measurements may suggest ACS but many other  chronic and acute conditions are known to elevate hsTnI results.  Refer to the "Links" section for chest pain algorithms and additional   guidance. Performed at [facility], [address] [place], [place] [address]   Protime-INR  Status: None   Collection Time: 04/01/23 12:48 AM  Result Value Ref Range   Prothrombin Time 14.2 11.4 - 15.2 seconds   INR 1.1 0.8 - 1.2    Comment: (NOTE) INR goal varies based on device and disease states. Performed at [facility], [address], [place], [place] [address]   Ammonia     Status: None   Collection Time: 04/01/23  1:13 AM  Result Value Ref Range   Ammonia 35 9 - 35 umol/L    Comment: Performed at [facility], [address] [place], [place] [address]  Troponin I (High Sensitivity)     Status: None   Collection Time: 04/01/23  3:52 AM  Result Value Ref Range   Troponin I (High Sensitivity) 11 <18 ng/L    Comment: (NOTE) Elevated high sensitivity troponin I (hsTnI) values and significant  changes across serial measurements may suggest ACS but many other  chronic and acute conditions are known to elevate hsTnI results.  Refer to the "Links" section for chest pain algorithms and additional  guidance. Performed at [facility], [address] [place], [place] [address]   Urinalysis, Routine w reflex microscopic -Urine, Catheterized     Status: Abnormal   Collection Time: 04/01/23  5:27 AM  Result Value Ref Range   Color, Urine YELLOW YELLOW   APPearance HAZY (A) CLEAR   Specific Gravity, Urine 1.012 1.005 - 1.030   pH 5.0 5.0 - 8.0   Glucose, UA NEGATIVE NEGATIVE mg/dL   Hgb urine dipstick NEGATIVE NEGATIVE   Bilirubin Urine NEGATIVE NEGATIVE   Ketones, ur NEGATIVE NEGATIVE mg/dL   Protein, ur 469 (A) NEGATIVE mg/dL   Nitrite POSITIVE (A) NEGATIVE   Leukocytes,Ua MODERATE (A) NEGATIVE   RBC / HPF 0-5 0 - 5 RBC/hpf   WBC, UA >50 0 - 5 WBC/hpf   Bacteria, UA MANY (A) NONE SEEN   Squamous Epithelial / HPF 0-5 0 - 5 /HPF   Mucus PRESENT     Comment: Performed at [facility], [address] [place], [place] [address]   CT  ABDOMEN PELVIS W CONTRAST  Result Date: 04/01/2023 CLINICAL DATA:  Distended abdomen, pitting edema in bilateral lower extremities. EXAM: CT ABDOMEN AND PELVIS WITH CONTRAST TECHNIQUE: Multidetector CT imaging of the abdomen and pelvis was performed using the standard protocol following bolus administration of intravenous contrast. RADIATION DOSE REDUCTION: This exam was performed according to the departmental dose-optimization program which includes automated exposure control, adjustment of the mA and/or kV according to patient size and/or use of iterative reconstruction technique. CONTRAST:  60mL OMNIPAQUE IOHEXOL 350 MG/ML SOLN COMPARISON:  03/29/2022. FINDINGS: Lower chest: There is a small to moderate right pleural effusion with compressive atelectasis in the right lower lobe. Mild atelectasis or scarring is present at the left lung base. Multi-vessel coronary artery calcifications are noted. Hepatobiliary: No focal abnormality in the liver. No biliary ductal dilation. Stones and hyperdense material are noted in the gallbladder. Pancreas: Unremarkable. No pancreatic ductal dilatation or surrounding inflammatory changes. Spleen: Normal in size without focal abnormality. Adrenals/Urinary Tract: Adrenal glands are within normal limits. Renal atrophy is noted bilaterally. The kidneys enhance symmetrically. There is a cyst in the lower pole of the right kidney. Subcentimeter hypodensity is noted in the left kidney which is too small to further characterize. No renal calculus or hydronephrosis. The bladder is unremarkable. Stomach/Bowel: There is a small hiatal hernia. The stomach is within normal limits. Multiple  distended gas-filled loops of transverse colon, descending colon and sigmoid colon without evidence of obstruction. The appendix is within normal limits. The small bowel is normal in caliber. There is mild rectal wall thickening with perirectal fat stranding. Vascular/Lymphatic: No significant vascular  findings are present. No enlarged abdominal or pelvic lymph nodes. Reproductive: Prostate gland is mildly enlarged. Other: No abdominopelvic ascites. Musculoskeletal: Degenerative changes are present in the thoracolumbar spine. No acute osseous abnormality. IMPRESSION: 1. Multiple distended loops gas-filled transverse, descending, and sigmoid colon without evidence of obstruction. 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 2. Small to moderate right pleural effusion with atelectasis at the lung bases. 3. Cholelithiasis. 4. Small hiatal hernia. 5. Aortic atherosclerosis and coronary artery calcifications. Electronically Signed   By: Thornell Sartorius M.D.   On: 04/01/2023 02:38   DG Chest Portable 1 View  Result Date: 04/01/2023 CLINICAL DATA:  Evaluation for fluid retention. EXAM: PORTABLE CHEST 1 VIEW COMPARISON:  March 29, 2022 FINDINGS: The heart size and mediastinal contours are within normal limits. Low lung volumes are noted. There is no evidence of focal consolidation. A small layering right pleural effusion is suspected. No pneumothorax is identified. Air-filled loops of distended bowel are suspected within the visualized portion of the upper abdomen. Multilevel degenerative changes are seen throughout the thoracic spine. IMPRESSION: 1. Low lung volumes with a small layering right pleural effusion. 2. A small bowel obstruction versus ileus cannot be excluded. Further correlation with abdomen pelvis CT is recommended. Electronically Signed   By: Aram Candela M.D.   On: 04/01/2023 01:07    Pending Labs Unresulted Labs (From admission, onward)     Start     Ordered   04/02/23 0500  Basic metabolic panel  Tomorrow morning,   R        04/01/23 0837   04/02/23 0500  CBC  Tomorrow morning,   R        04/01/23 0837   04/01/23 0836  HIV Antibody (routine testing w rflx)  (HIV Antibody (Routine testing w reflex) panel)  Once,   R        04/01/23 0837    04/01/23 0645  Urine Culture  Add-on,   AD       Question:  Indication  Answer:  Dysuria   04/01/23 0644            Vitals/Pain Today's Vitals   04/01/23 0926 04/01/23 0930 04/01/23 0945 04/01/23 1000  BP:  (!) 173/81 (!) 152/99 (!) 166/83  Pulse:  63 60 63  Resp:  Temp: 98.6 F (37 C)     TempSrc: Oral     SpO2:  100% 100% 100%  Weight:      Height:      PainSc:        Isolation Precautions No active isolations  Medications Medications  allopurinol (ZYLOPRIM) tablet 100 mg (100 mg Oral Given 04/01/23 0946)  oxyCODONE (Oxy IR/ROXICODONE) immediate release tablet 5 mg (has no administration in time range)  amLODipine (NORVASC) tablet 5 mg (5 mg Oral Given 04/01/23 0946)  atorvastatin (LIPITOR) tablet 20 mg (has no administration in time range)  traZODone (DESYREL) tablet 50 mg (has no administration in time range)  levothyroxine (SYNTHROID) tablet 50 mcg (50 mcg Oral Given 04/01/23 0947)  sodium bicarbonate tablet 1,300 mg (1,300 mg Oral Given 04/01/23 0947)  potassium chloride SA (KLOR-CON M) CR tablet 40 mEq (40 mEq Oral Given 04/01/23 0947)  enoxaparin (LOVENOX) injection 40 mg (40 mg Subcutaneous Given 04/01/23 0950)  lactated ringers infusion ( Intravenous New Bag/Given 04/01/23 0953)  acetaminophen (TYLENOL) tablet 650 mg (has no administration in time range)    Or  acetaminophen (TYLENOL) suppository 650 mg (has no administration in time range)  morphine (PF) 2 MG/ML injection 2 mg (has no administration in time range)  docusate sodium (COLACE) capsule 100 mg (100 mg Oral Given 04/01/23 0947)  polyethylene glycol (MIRALAX / GLYCOLAX) packet 17 g (has no administration in time range)  bisacodyl (DULCOLAX) EC tablet 5 mg (has no administration in time range)  ondansetron (ZOFRAN) tablet 4 mg (has no administration in time range)    Or  ondansetron (ZOFRAN) injection 4 mg (has no administration in time range)  hydrALAZINE (APRESOLINE) injection 5 mg (has no  administration in time range)  iohexol (OMNIPAQUE) 350 MG/ML injection 60 mL (60 mLs Intravenous Contrast Given 04/01/23 0219)  simethicone (MYLICON) 40 MG/0.6ML suspension 40 mg (40 mg Oral Given 04/01/23 0403)  cefTRIAXone (ROCEPHIN) 2 g in sodium chloride 0.9 % 100 mL IVPB (0 g Intravenous Stopped 04/01/23 0749)    Mobility non-ambulatory     Focused Assessments Cardiac Assessment Handoff:    No results found for: "CKTOTAL", "CKMB", "CKMBINDEX", "TROPONINI" No results found for: "DDIMER" Does the Patient currently have chest pain? No    R Recommendations: See Admitting Provider Note  Report given to:   Additional Notes:  Right side weakness at baseline

## 2023-04-01 NOTE — H&P (Addendum)
History and Physical    Patient: Terry Compston Sr. ZOX:[id number] DOB: 1956-02-11 DOA: 03/31/2023 DOS: the patient was seen and examined on 04/01/2023 PCP: Loura Back, NP  Patient coming from: Home - lives with son and brother and a couple of kids; NOK: [place], [place], [phone]   Chief Complaint: Abdominal distention  HPI: Terry Fowles Sr. is a 67 y.o. male with medical history significant of remote spinal cord injury with paraplegia, CVA, HTN, HLD, stage 3a CKD, and PVD s/p toe amputations presenting with abdominal distention.  He reports that his stomach was swollen.  He noticed it maybe a couple of days ago, worsening.  No abdominal pain, just bloating.  Last BM was last night, he thinks.  Some LE edema.    ER Course:  Carryover, per Dr. Joneen Roach:  For the past 2 days he has been developed worsening abdominal distention without pain.  His last BM was on Monday.  CT abdomen pelvis shows multiple distended loops gas-filled transverse, descending, and sigmoid colon without evidence of obstruction. 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 Possible Ogilvie's.  Dr. Orvan Falconer consulted     Review of Systems: As mentioned in the history of present illness. All other systems reviewed and are negative. Past Medical History:  Diagnosis Date   Anemia    Bedbound    uses wheel chair   Chronic kidney disease    stage 3   Diabetes mellitus without complication    no meds   HLD (hyperlipidemia)    Hypertension    Hypothyroidism    Intracranial bleeding    Intracranial bleeding    Paraplegia    can move L arm only   Stroke 2021   Past Surgical History:  Procedure Laterality Date   AMPUTATION TOE Left 06/03/2022   Procedure: AMPUTATION TOE;  Surgeon: Vivi Barrack, DPM;  Location: [facility] OR;  Service: Podiatry;  Laterality: Left;   COLONOSCOPY     TOE AMPUTATION Bilateral    x 3,   2 on left and 1 on right   Social History:  reports  that he has never smoked. He has never used smokeless tobacco. He reports that he does not currently use alcohol. He reports that he does not currently use drugs.  No Known Allergies  Family History  Family history unknown: Yes    Prior to Admission medications   Medication Sig Start Date End Date Taking? Authorizing Provider  allopurinol (ZYLOPRIM) 100 MG tablet Take 100 mg by mouth daily. 03/02/23  Yes [provider]  amLODipine (NORVASC) 5 MG tablet Take 1 tablet (5 mg total) by mouth daily. 06/08/22  Yes Rolly Salter, MD  atorvastatin (LIPITOR) 20 MG tablet Take 20 mg by mouth at bedtime.   Yes [provider]  levothyroxine (SYNTHROID) 50 MCG tablet Take 50 mcg by mouth.   Yes [provider]  oxyCODONE (OXY IR/ROXICODONE) 5 MG immediate release tablet Take 1 tablet (5 mg total) by mouth 3 (three) times daily as needed for moderate pain or severe pain. 06/08/22  Yes Rolly Salter, MD  potassium chloride SA (KLOR-CON M) 20 MEQ tablet Take 40 mEq by mouth 2 (two) times daily. 08/20/22  Yes [provider]  sodium bicarbonate 650 MG tablet Take 2 tablets (1,300 mg total) by mouth 3 (three) times daily. 06/08/22  Yes Rolly Salter, MD  Cholecalciferol (VITAMIN D3) 50 MCG (2000 UT) TABS Take 2,000 Units by mouth daily. Patient not  taking: Reported on 04/01/2023    [provider]  docusate sodium (COLACE) 100 MG capsule Take 1 capsule (100 mg total) by mouth 2 (two) times daily. Patient not taking: Reported on 04/01/2023 06/08/22 06/08/23  Rolly Salter, MD  doxycycline (VIBRA-TABS) 100 MG tablet Take 1 tablet (100 mg total) by mouth 2 (two) times daily. Patient not taking: Reported on 04/01/2023 11/17/22   Vivi Barrack, DPM  mirtazapine (REMERON) 7.5 MG tablet Take 7.5 mg by mouth at bedtime.    [provider]  Multiple Vitamins-Minerals (CERTAVITE/ANTIOXIDANTS PO) Take 1 tablet by mouth daily. Patient not taking: Reported on 04/01/2023     [provider]  Nutritional Supplements (NEPRO) LIQD Take 1 each by mouth in the morning and at bedtime. Patient not taking: Reported on 04/01/2023 06/08/22   Rolly Salter, MD  polyethylene glycol (MIRALAX) 17 g packet Take 17 g by mouth daily. Patient not taking: Reported on 04/01/2023 06/08/22   Rolly Salter, MD  traZODone (DESYREL) 50 MG tablet Take 1 tablet (50 mg total) by mouth at bedtime as needed for sleep. 06/01/20   Leroy Sea, MD    Physical Exam: Vitals:   04/01/23 0930 04/01/23 0945 04/01/23 1000 04/01/23 1151  BP: (!) 173/81 (!) 152/99 (!) 166/83 (!) 162/86  Pulse: 63 60 63 71  Resp: Temp:    (!) 97.4 F (36.3 C)  TempSrc:    Oral  SpO2: 100% 100% 100% 100%  Weight:      Height:       General:  Appears calm and comfortable and is in NAD Eyes:  EOMI, normal lids, iris ENT:  grossly normal hearing, lips & tongue, mmm Neck:  no LAD, masses or thyromegaly Cardiovascular:  RRR, no m/r/g. 1+ LE edema.  Respiratory:   CTA bilaterally with no wheezes/rales/rhonchi.  Normal respiratory effort. Abdomen:  taut, very distended abdomen Skin:  no rash or induration seen on limited exam Musculoskeletal:  has mobility of LUE Psychiatric:  blunted mood and affect, speech fluent and appropriate, AOx3 Neurologic:  CN 2-12 grossly intact, moves all extremities in coordinated fashion, sensation intact   Radiological Exams on Admission: Independently reviewed - see discussion in A/P where applicable  CT ABDOMEN PELVIS W CONTRAST  Result Date: 04/01/2023 CLINICAL DATA:  Distended abdomen, pitting edema in bilateral lower extremities. EXAM: CT ABDOMEN AND PELVIS WITH CONTRAST TECHNIQUE: Multidetector CT imaging of the abdomen and pelvis was performed using the standard protocol following bolus administration of intravenous contrast. RADIATION DOSE REDUCTION: This exam was performed according to the departmental dose-optimization program which includes  automated exposure control, adjustment of the mA and/or kV according to patient size and/or use of iterative reconstruction technique. CONTRAST:  60mL OMNIPAQUE IOHEXOL 350 MG/ML SOLN COMPARISON:  03/29/2022. FINDINGS: Lower chest: There is a small to moderate right pleural effusion with compressive atelectasis in the right lower lobe. Mild atelectasis or scarring is present at the left lung base. Multi-vessel coronary artery calcifications are noted. Hepatobiliary: No focal abnormality in the liver. No biliary ductal dilation. Stones and hyperdense material are noted in the gallbladder. Pancreas: Unremarkable. No pancreatic ductal dilatation or surrounding inflammatory changes. Spleen: Normal in size without focal abnormality. Adrenals/Urinary Tract: Adrenal glands are within normal limits. Renal atrophy is noted bilaterally. The kidneys enhance symmetrically. There is a cyst in the lower pole of the right kidney. Subcentimeter hypodensity is noted in the left kidney which is too small to further characterize.  No renal calculus or hydronephrosis. The bladder is unremarkable. Stomach/Bowel: There is a small hiatal hernia. The stomach is within normal limits. Multiple distended gas-filled loops of transverse colon, descending colon and sigmoid colon without evidence of obstruction. The appendix is within normal limits. The small bowel is normal in caliber. There is mild rectal wall thickening with perirectal fat stranding. Vascular/Lymphatic: No significant vascular findings are present. No enlarged abdominal or pelvic lymph nodes. Reproductive: Prostate gland is mildly enlarged. Other: No abdominopelvic ascites. Musculoskeletal: Degenerative changes are present in the thoracolumbar spine. No acute osseous abnormality. IMPRESSION: 1. Multiple distended loops gas-filled transverse, descending, and sigmoid colon without evidence of obstruction. Mild rectal wall thickening and perirectal fat stranding is noted, possible  proctitis. 2. Small to moderate right pleural effusion with atelectasis at the lung bases. 3. Cholelithiasis. 4. Small hiatal hernia. 5. Aortic atherosclerosis and coronary artery calcifications. Electronically Signed   By: Thornell Sartorius M.D.   On: 04/01/2023 02:38   DG Chest Portable 1 View  Result Date: 04/01/2023 CLINICAL DATA:  Evaluation for fluid retention. EXAM: PORTABLE CHEST 1 VIEW COMPARISON:  March 29, 2022 FINDINGS: The heart size and mediastinal contours are within normal limits. Low lung volumes are noted. There is no evidence of focal consolidation. A small layering right pleural effusion is suspected. No pneumothorax is identified. Air-filled loops of distended bowel are suspected within the visualized portion of the upper abdomen. Multilevel degenerative changes are seen throughout the thoracic spine. IMPRESSION: 1. Low lung volumes with a small layering right pleural effusion. 2. A small bowel obstruction versus ileus cannot be excluded. Further correlation with abdomen pelvis CT is recommended. Electronically Signed   By: Aram Candela M.D.   On: 04/01/2023 01:07    EKG: Independently reviewed.  NSR with rate 69; nonspecific ST changes with no evidence of acute ischemia   Labs on Admission: I have personally reviewed the available labs and imaging studies at the time of the admission.  Pertinent labs:    CO2 17 Glucose 149 BUN 25/Creatinine 1.91/GFR 37 - stable Albumin 3.2 NH4 35 BNP 36.5 HS troponin 11, 11 WBC 12 Hgb 11.6 INR 1.1 UA: moderate LE, + nitrite, many bacteria   Assessment and Plan: Principal Problem:   Ogilvie's syndrome Active Problems:   Essential hypertension   Hyperlipidemia   Hypothyroidism   Stage 3b chronic kidney disease (CKD)   Proctitis    Ogilvie's Syndrome -Patient with subacute onset of abdominal distention -He has h/o spinal cord injury plus CVA -Likely Ogilvie's, pseudo-obstruction of colon -EDP d/w surgery who recommended GI  consultation and medicine admission -GI is consulting, has request colonic measurements to add to report -If N/V, he may need NG tube -Will admit to med surg  Proctitis -Possible diagnosis based on imaging -Patient without complaint of discomfort -Will hold abx - if needed, ID pharmacist recommends Rocephin + Doxy x 7 days (would need Doxy added, currently covering with Rocephin for presumed UTI) -Defer to GI at this time  Stage 3b CKD -Appears to be stable at this time -Attempt to avoid nephrotoxic medications -Recheck BMP in AM  -Continue bicarb  Spinal cord injury + h/o CVA -Bedbound -Unable to transfer himself -Has use of LUE and otherwise paraplegic -Noted to have bladder distention, appears to have UTI so will cover with Rocephin -Foley placed in ER  PVD -s/p toe amputations -No current complaints  HTN -Continue amlodipine  HLD -Continue atorvastatin  Hypothyroidism -Continue Synthroid    Advance Care Planning:  Code Status: Full Code - Code status was discussed with the patient at the time of admission.  The patient would want to receive full resuscitative measures at this time.   Consults: GI; PT/OT; nutrition; TOC team  DVT Prophylaxis: Lovenox  Family Communication: None present; I spoke with his son following the admission  Severity of Illness: The appropriate patient status for this patient is INPATIENT. Inpatient status is judged to be reasonable and necessary in order to provide the required intensity of service to ensure the patient's safety. The patient's presenting symptoms, physical exam findings, and initial radiographic and laboratory data in the context of their chronic comorbidities is felt to place them at high risk for further clinical deterioration. Furthermore, it is not anticipated that the patient will be medically stable for discharge from the hospital within 2 midnights of admission.   * I certify that at the point of admission it is my  clinical judgment that the patient will require inpatient hospital care spanning beyond 2 midnights from the point of admission due to high intensity of service, high risk for further deterioration and high frequency of surveillance required.*  Author: Jonah Blue, MD 04/01/2023 1:58 PM  For on call review [email or web address].

## 2023-04-01 NOTE — Evaluation (Signed)
Physical Therapy Evaluation Patient Details Name: Terry Canterbury Sr. MRN: [id number] DOB: Apr 26, 1956 Today's Date: 04/01/2023  History of Present Illness  67 y.o. male presents to [facility] hospital on 03/31/2023 with abdominal distention. CT abdomen shows multiple distended loops without obstruction. PMH includes ICH, DM, CKD, hypothyroidism, HTN, HLD.  Clinical Impression  Pt presents to PT at or near his baseline. Pt is totalA for most ADLs due to R hemiplegia and significant ROM/strength deficits in L side as well. Pt's family assist him in ADLs and hoyer lift transfers to a power wheelchair. Once in PWC the pt is able to mobilize independently within the home. Pt's LUE ROM and strength appear functional for purposes of utilizing a joystick for PWC at this time. PT encourages frequent AROM to maintain the strength and mobility in this extremity. PT provides education on the need for frequent turns to reduce the risk of pressure injury. PT also recommends the pt be lifted from bed to recliner at least daily to assist in maintaining tolerance for upright positioning and to maintain the LE ROM necessary to sit in a power wheelchair. PT recommends no post-acute PT follow-up.     Recommendations for follow up therapy are one component of a multi-disciplinary discharge planning process, led by the attending physician.  Recommendations may be updated based on patient status, additional functional criteria and insurance authorization.  Follow Up Recommendations       Assistance Recommended at Discharge Frequent or constant Supervision/Assistance  Patient can return home with the following  A lot of help with bathing/dressing/bathroom;Assistance with cooking/housework;Assistance with feeding;Assist for transportation;Help with stairs or ramp for entrance;Direct supervision/assist for medications management    Equipment Recommendations None recommended by PT  Recommendations for Other Services        Functional Status Assessment Patient has not had a recent decline in their functional status (appears around baseline, acute PT services for maintenance during admission)     Precautions / Restrictions Precautions Precautions: Fall Precaution Comments: hemiplegia on R with devloped chronic weakness and ROM deficits on left side Restrictions Weight Bearing Restrictions: No      Mobility  Bed Mobility               General bed mobility comments: pt declines bed mobility at this time, PT provides education on 2 hour turns schedule to reduce the risk of pressure injury    Transfers Overall transfer level:  (pt declines out of bed transfer with lift)                      Ambulation/Gait                  Stairs            Wheelchair Mobility    Modified Rankin (Stroke Patients Only)       Balance                                             Pertinent Vitals/Pain Pain Assessment Pain Assessment: No/denies pain    Home Living Family/patient expects to be discharged to:: Private residence Living Arrangements: Children Available Help at Discharge: Family;Available 24 hours/day Type of Home: House Home Access: Ramped entrance       Home Layout: Able to live on main level with bedroom/bathroom Home Equipment: Other (comment);Hospital bed;Wheelchair - power (hoyer lift)  Prior Function Prior Level of Function : Needs assist             Mobility Comments: family assists the pt in hoyer lift transfers to [facility], once in [facility] the pt is able to mobilize in the home independently. ADLs Comments: family assists with all ADLs, toileting, bathing and dressing at bed level     Hand Dominance        Extremity/Trunk Assessment   Upper Extremity Assessment Upper Extremity Assessment: RUE deficits/detail;LUE deficits/detail RUE Deficits / Details: pt with extremely limited shoulder ROM, only a few degrees in each direction.  Pt is able to extend elbow actively to ~90 degrees and passively to ~110 degrees. Digit ROM WFL, strength 3-/5 in hand LUE Deficits / Details: pt is able to flex shoulder ~80 degrees actively and ~100 passively, abducts shoulder to ~80 degrees, elbow extension to ~100 degrees. Digit ROM WFL including opposition, strength 4-/5 in digits.    Lower Extremity Assessment Lower Extremity Assessment: RLE deficits/detail;LLE deficits/detail RLE Deficits / Details: RLE grossly 2-/5 LLE Deficits / Details: LLE grossly 2-/5    Cervical / Trunk Assessment Cervical / Trunk Assessment: Kyphotic  Communication      Cognition Arousal/Alertness: Awake/alert Behavior During Therapy: WFL for tasks assessed/performed Overall Cognitive Status: Within Functional Limits for tasks assessed                                          General Comments General comments (skin integrity, edema, etc.): VSS on RA    Exercises Other Exercises Other Exercises: PT encourages AROM of LUE in an effort to maintain ROM and strength necessary for use of PWC. Pt will need shoulder flexion/extension/internal rotation and external rotation, elbow flexion/extension, digit flexion/extension   Assessment/Plan    PT Assessment Patient needs continued PT services  PT Problem List Decreased strength;Decreased range of motion;Decreased mobility       PT Treatment Interventions Functional mobility training;Therapeutic activities;Patient/family education;Therapeutic exercise    PT Goals (Current goals can be found in the Care Plan section)  Acute Rehab PT Goals Patient Stated Goal: to go home PT Goal Formulation: With patient Time For Goal Achievement: 04/15/23 Potential to Achieve Goals: Good Additional Goals Additional Goal #1: Pt will tolerate hoyer lift from bed to recliner or wheelchair, and sit up for at least one hour, to demonstrate tolerance for mobility in the home setting    Frequency Min  1X/week     Co-evaluation               AM-PAC PT "6 Clicks" Mobility  Outcome Measure Help needed turning from your back to your side while in a flat bed without using bedrails?: Total Help needed moving from lying on your back to sitting on the side of a flat bed without using bedrails?: Total Help needed moving to and from a bed to a chair (including a wheelchair)?: Total Help needed standing up from a chair using your arms (e.g., wheelchair or bedside chair)?: Total Help needed to walk in hospital room?: Total Help needed climbing 3-5 steps with a railing? : Total 6 Click Score: 6    End of Session   Activity Tolerance: Patient tolerated treatment well Patient left: in bed;with call bell/phone within reach Nurse Communication: Mobility status;Need for lift equipment PT Visit Diagnosis: Other symptoms and signs involving the nervous system (Z61.096)    Time: 0454-0981  PT Time Calculation (min) (ACUTE ONLY): 15 min   Charges:   PT Evaluation $PT Eval Low Complexity: 1 Low          Arlyss Gandy, PT, DPT Acute Rehabilitation Office [phone]   Arlyss Gandy 04/01/2023, 12:25 PM

## 2023-04-01 NOTE — Consult Note (Addendum)
     Consultation  Referring Provider:  TRH  Primary Care Physician:  Nguyen, Kim, NP Primary Gastroenterologist:  Unassigned       Reason for Consultation: Ogilvie's Syndrome    LOS: 0 days          HPI:   Terry Mentel Sr. is a 67 y.o. male with past medical history significant for ICH d/t HTN 2021, DM, CKD, hypothyroidism, presents for evaluation of abdominal distention.  Patient states Monday 4/22 he noticed his abdomen became significantly distended and uncomfortable (denies pain). Reports Sunday he felt normal and had normal bowel movements without melena or hematochezia. Sunday was his last BM. States he unsure of the last time he passed gas. Denies shortness of breath, chest pain, weakness. Feels abdominal pressure but no pain. Denies nausea/vomiting.  States he thinks his last colonoscopy was within the last year in [place] (no reports in [facility]). Unsure where it was done or what it showed. Denies NSAID use, tobacco use, alcohol use. States he is typically in a wheelchair.  CT abdomen pelvis with contrast showed multiple distended loops gas-filled transverse, descending, and sigmoid colon without evidence of obstruction.  Mild wall thickening and perirectal fat stranding noted (possible proctitis), cholelithiasis  No family was present at the time of my evaluation.  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 Family History:  His Family history is unknown by patient. Social History:   reports that he has never smoked. He has never used  smokeless tobacco. He reports that he does not currently use alcohol. He reports that he does not currently use drugs.  Prior to Admission medications   Medication Sig Start Date End Date Taking? Authorizing Provider  allopurinol (ZYLOPRIM) 100 MG tablet Take 100 mg by mouth daily. 03/02/23  Yes [provider]  amLODipine (NORVASC) 5 MG tablet Take 1 tablet (5 mg total) by mouth daily. 06/08/22  Yes Patel, Pranav M, MD  atorvastatin (LIPITOR) 20 MG tablet Take 20 mg by mouth at bedtime.   Yes [provider]  levothyroxine (SYNTHROID) 50 MCG tablet Take 50 mcg by mouth.   Yes [provider]  oxyCODONE (OXY IR/ROXICODONE) 5 MG immediate release tablet Take 1 tablet (5 mg total) by mouth 3 (three) times daily as needed for moderate pain or severe pain. 06/08/22  Yes Patel, Pranav M, MD  potassium chloride SA (KLOR-CON M) 20 MEQ tablet Take 40 mEq by mouth 2 (two) times daily. 08/20/22  Yes [provider]  sodium bicarbonate 650 MG tablet Take 2 tablets (1,300 mg total) by mouth 3 (three) times daily. 06/08/22  Yes Patel, Pranav M, MD  traZODone (DESYREL) 50 MG tablet Take 1 tablet (50 mg total) by mouth at bedtime as needed for sleep. 06/01/20  Yes Singh, Prashant K, MD    Current Facility-Administered Medications  Medication Dose Route Frequency Provider Last Rate Last Admin   acetaminophen (TYLENOL) tablet 650 mg  650   mg Oral Q6H PRN Yates, Jennifer, MD       Or   acetaminophen (TYLENOL) suppository 650 mg  650 mg Rectal Q6H PRN Yates, Jennifer, MD       allopurinol (ZYLOPRIM) tablet 100 mg  100 mg Oral Daily Yates, Jennifer, MD   100 mg at 04/01/23 0946   amLODipine (NORVASC) tablet 5 mg  5 mg Oral Daily Yates, Jennifer, MD   5 mg at 04/01/23 0946   atorvastatin (LIPITOR) tablet 20 mg  20 mg Oral QHS Yates, Jennifer, MD       bisacodyl (DULCOLAX) EC tablet 5 mg  5 mg Oral Daily PRN Yates, Jennifer, MD       docusate sodium (COLACE) capsule 100 mg  100 mg Oral BID  Yates, Jennifer, MD   100 mg at 04/01/23 0947   enoxaparin (LOVENOX) injection 40 mg  40 mg Subcutaneous Q24H Yates, Jennifer, MD   40 mg at 04/01/23 0950   hydrALAZINE (APRESOLINE) injection 5 mg  5 mg Intravenous Q4H PRN Yates, Jennifer, MD       lactated ringers infusion   Intravenous Continuous Yates, Jennifer, MD 75 mL/hr at 04/01/23 0953 New Bag at 04/01/23 0953   levothyroxine (SYNTHROID) tablet 50 mcg  50 mcg Oral Q0600 Yates, Jennifer, MD   50 mcg at 04/01/23 0947   morphine (PF) 2 MG/ML injection 2 mg  2 mg Intravenous Q2H PRN Yates, Jennifer, MD       ondansetron (ZOFRAN) tablet 4 mg  4 mg Oral Q6H PRN Yates, Jennifer, MD       Or   ondansetron (ZOFRAN) injection 4 mg  4 mg Intravenous Q6H PRN Yates, Jennifer, MD       oxyCODONE (Oxy IR/ROXICODONE) immediate release tablet 5 mg  5 mg Oral TID PRN Yates, Jennifer, MD       polyethylene glycol (MIRALAX / GLYCOLAX) packet 17 g  17 g Oral Daily PRN Yates, Jennifer, MD       potassium chloride SA (KLOR-CON M) CR tablet 40 mEq  40 mEq Oral BID Yates, Jennifer, MD   40 mEq at 04/01/23 0947   sodium bicarbonate tablet 1,300 mg  1,300 mg Oral TID Yates, Jennifer, MD   1,300 mg at 04/01/23 0947   traZODone (DESYREL) tablet 50 mg  50 mg Oral QHS PRN Yates, Jennifer, MD       Current Outpatient Medications  Medication Sig Dispense Refill   allopurinol (ZYLOPRIM) 100 MG tablet Take 100 mg by mouth daily.     amLODipine (NORVASC) 5 MG tablet Take 1 tablet (5 mg total) by mouth daily. 30 tablet 0   atorvastatin (LIPITOR) 20 MG tablet Take 20 mg by mouth at bedtime.     levothyroxine (SYNTHROID) 50 MCG tablet Take 50 mcg by mouth.     oxyCODONE (OXY IR/ROXICODONE) 5 MG immediate release tablet Take 1 tablet (5 mg total) by mouth 3 (three) times daily as needed for moderate pain or severe pain. 15 tablet 0   potassium chloride SA (KLOR-CON M) 20 MEQ tablet Take 40 mEq by mouth 2 (two) times daily.     sodium bicarbonate 650 MG tablet Take 2 tablets  (1,300 mg total) by mouth 3 (three) times daily. 120 tablet 0   traZODone (DESYREL) 50 MG tablet Take 1 tablet (50 mg total) by mouth at bedtime as needed for sleep. 30 tablet 0    Allergies as of 03/31/2023   (No Known Allergies)    Review of Systems    Constitutional:  Negative for chills, fever and weight loss.  HENT:  Negative for hearing loss and tinnitus.   Eyes:  Negative for blurred vision and double vision.  Respiratory: Negative.    Cardiovascular: Negative.  Negative for chest pain and palpitations.  Gastrointestinal:  Negative for abdominal pain, blood in stool, constipation, diarrhea, heartburn, melena, nausea and vomiting.       Bloating/distention  Genitourinary:  Negative for dysuria and urgency.  Musculoskeletal:  Negative for myalgias and neck pain.  Skin:  Negative for itching and rash.  Neurological:  Negative for seizures and loss of consciousness.  Psychiatric/Behavioral:  Negative for depression and suicidal ideas.        Physical Exam:  Vital signs in last 24 hours: Temp:  [97.8 F (36.6 C)-98.6 F (37 C)] 98.6 F (37 C) (04/24 0926) Pulse Rate:  [60-72] 63 (04/24 1000) Resp:  [11-23] 15 (04/24 1000) BP: (135-189)/(73-124) 166/83 (04/24 1000) SpO2:  [100 %] 100 % (04/24 1000) Weight:  [104.3 kg] 104.3 kg (04/23 2353)   Last BM recorded by nurses in past 5 days No data recorded  Physical Exam Constitutional:      General: He is not in acute distress.    Appearance: He is obese.  HENT:     Head: Normocephalic and atraumatic.     Nose: Nose normal. No congestion.     Mouth/Throat:     Mouth: Mucous membranes are moist.     Pharynx: Oropharynx is clear.  Eyes:     Extraocular Movements: Extraocular movements intact.     Conjunctiva/sclera: Conjunctivae normal.  Cardiovascular:     Rate and Rhythm: Normal rate and regular rhythm.  Pulmonary:     Effort: Pulmonary effort is normal. No respiratory distress.  Abdominal:     Tenderness: There is no  abdominal tenderness. There is no guarding.     Comments: Taught abdomen, significantly distended, normal bowel sounds. Nontender.  Musculoskeletal:     Cervical back: Normal range of motion and neck supple.     Right lower leg: Edema present.     Left lower leg: Edema present.  Skin:    General: Skin is warm and dry.  Neurological:     General: No focal deficit present.     Mental Status: He is oriented to person, place, and time.  Psychiatric:        Mood and Affect: Mood normal.        Behavior: Behavior normal.        Thought Content: Thought content normal.        Judgment: Judgment normal.      LAB RESULTS: Recent Labs    04/01/23 0048  WBC 12.0*  HGB 11.6*  HCT 36.7*  PLT 302   BMET Recent Labs    04/01/23 0048  NA 135  K 3.8  CL 106  CO2 17*  GLUCOSE 149*  BUN 25*  CREATININE 1.91*  CALCIUM 8.7*   LFT Recent Labs    04/01/23 0048  PROT 7.1  ALBUMIN 3.2*  AST 25  ALT 20  ALKPHOS 101  BILITOT 0.8   PT/INR Recent Labs    04/01/23 0048  LABPROT 14.2  INR 1.1    STUDIES: CT ABDOMEN PELVIS W CONTRAST  Result Date: 04/01/2023 CLINICAL DATA:  Distended abdomen, pitting edema in bilateral lower extremities. EXAM: CT ABDOMEN AND PELVIS WITH CONTRAST TECHNIQUE: Multidetector CT imaging of the abdomen and pelvis was performed using the standard protocol following bolus administration of   intravenous contrast. RADIATION DOSE REDUCTION: This exam was performed according to the departmental dose-optimization program which includes automated exposure control, adjustment of the mA and/or kV according to patient size and/or use of iterative reconstruction technique. CONTRAST:  60mL OMNIPAQUE IOHEXOL 350 MG/ML SOLN COMPARISON:  03/29/2022. FINDINGS: Lower chest: There is a small to moderate right pleural effusion with compressive atelectasis in the right lower lobe. Mild atelectasis or scarring is present at the left lung base. Multi-vessel coronary artery  calcifications are noted. Hepatobiliary: No focal abnormality in the liver. No biliary ductal dilation. Stones and hyperdense material are noted in the gallbladder. Pancreas: Unremarkable. No pancreatic ductal dilatation or surrounding inflammatory changes. Spleen: Normal in size without focal abnormality. Adrenals/Urinary Tract: Adrenal glands are within normal limits. Renal atrophy is noted bilaterally. The kidneys enhance symmetrically. There is a cyst in the lower pole of the right kidney. Subcentimeter hypodensity is noted in the left kidney which is too small to further characterize. No renal calculus or hydronephrosis. The bladder is unremarkable. Stomach/Bowel: There is a small hiatal hernia. The stomach is within normal limits. Multiple distended gas-filled loops of transverse colon, descending colon and sigmoid colon without evidence of obstruction. The appendix is within normal limits. The small bowel is normal in caliber. There is mild rectal wall thickening with perirectal fat stranding. Vascular/Lymphatic: No significant vascular findings are present. No enlarged abdominal or pelvic lymph nodes. Reproductive: Prostate gland is mildly enlarged. Other: No abdominopelvic ascites. Musculoskeletal: Degenerative changes are present in the thoracolumbar spine. No acute osseous abnormality. IMPRESSION: 1. Multiple distended loops gas-filled transverse, descending, and sigmoid colon without evidence of obstruction. 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 2. Small to moderate right pleural effusion with atelectasis at the lung bases. 3. Cholelithiasis. 4. Small hiatal hernia. 5. Aortic atherosclerosis and coronary artery calcifications. Electronically Signed   By: Laura  Taylor M.D.   On: 04/01/2023 02:38   DG Chest Portable 1 View  Result Date: 04/01/2023 CLINICAL DATA:  Evaluation for fluid retention. EXAM: PORTABLE CHEST 1 VIEW COMPARISON:  March 29, 2022  FINDINGS: The heart size and mediastinal contours are within normal limits. Low lung volumes are noted. There is no evidence of focal consolidation. A small layering right pleural effusion is suspected. No pneumothorax is identified. Air-filled loops of distended bowel are suspected within the visualized portion of the upper abdomen. Multilevel degenerative changes are seen throughout the thoracic spine. IMPRESSION: 1. Low lung volumes with a small layering right pleural effusion. 2. A small bowel obstruction versus ileus cannot be excluded. Further correlation with abdomen pelvis CT is recommended. Electronically Signed   By: Thaddeus  Houston M.D.   On: 04/01/2023 01:07      Impression    Ogilvie's syndrome CT abdomen pelvis with contrast showed multiple distended loops gas-filled transverse, descending, and sigmoid colon without evidence of obstruction.  Mild wall thickening and perirectal fat stranding noted (possible proctitis), cholelithiasis WBC 12.0 Hgb 11.6 Less inclined to have patient undergo decompression at this time with stable presentation. Will need flex sig at some point to evaluate possible proctitis. Would prefer to get better idea of measurements of dilated colon prior to considering decompression.  CKD -BUN 25, creatinine 1.91, GFR 37   Plan   - Discussed with radiology reading room to add addendum to CT report to include measurements of colonic distention, await these results - Consider Dulcolax suppository - Continue supportive care - Continue to maintain magnesium above 2 and potassium at   4-4.5.  - Pending CT results, will establish timing of possible flex sig to evaluate proctitis  ADDENDUM: - Planning for flex sig tomorrow. Can have clears today as tolerated. NPO midnight. - Enema prep - Hold AM lovenox  Thank you for your kind consultation, we will continue to follow.  Alwilda Gilland M Verl Whitmore  04/01/2023, 11:01 AM  

## 2023-04-01 NOTE — Progress Notes (Addendum)
This is a 67 y.o. 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 He is chair bound.  For the past 2 days he has been developed worsening abdominal distention.  His last BM was on Monday.  There is no reports of significant abdominal pain.  He came to the ER.  CT abdomen pelvis shows . Multiple distended loops gas-filled transverse, descending, and sigmoid colon without evidence of obstruction. 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  Possible Ogilvie's.  Admission requested. Dr. Orvan Falconer consulted

## 2023-04-01 NOTE — Progress Notes (Signed)
   04/01/23 2200  Hygiene  Hygiene  (patient stated that he knows when he has went to the bathroom. Refused to allow me to check.)

## 2023-04-02 ENCOUNTER — Encounter (HOSPITAL_COMMUNITY): Payer: Self-pay | Admitting: Internal Medicine

## 2023-04-02 ENCOUNTER — Inpatient Hospital Stay (HOSPITAL_COMMUNITY): Payer: Medicare HMO | Admitting: Anesthesiology

## 2023-04-02 ENCOUNTER — Encounter (HOSPITAL_COMMUNITY)
Admission: EM | Disposition: A | Discharge status: Home / Self Care | Payer: Self-pay | Source: Home / Self Care | Attending: Internal Medicine

## 2023-04-02 DIAGNOSIS — E669 Obesity, unspecified: Secondary | ICD-10-CM

## 2023-04-02 DIAGNOSIS — N1832 Chronic kidney disease, stage 3b: Secondary | ICD-10-CM

## 2023-04-02 DIAGNOSIS — I10 Essential (primary) hypertension: Secondary | ICD-10-CM

## 2023-04-02 DIAGNOSIS — K5939 Other megacolon: Secondary | ICD-10-CM | POA: Diagnosis not present

## 2023-04-02 DIAGNOSIS — D128 Benign neoplasm of rectum: Secondary | ICD-10-CM

## 2023-04-02 DIAGNOSIS — K5989 Other specified functional intestinal disorders: Secondary | ICD-10-CM | POA: Diagnosis not present

## 2023-04-02 DIAGNOSIS — E119 Type 2 diabetes mellitus without complications: Secondary | ICD-10-CM

## 2023-04-02 HISTORY — PX: FLEXIBLE SIGMOIDOSCOPY: SHX5431

## 2023-04-02 LAB — CBC
HCT: 32.3 % — ABNORMAL LOW (ref 39.0–52.0)
Hemoglobin: 10.6 g/dL — ABNORMAL LOW (ref 13.0–17.0)
MCH: 30.5 pg (ref 26.0–34.0)
MCHC: 32.8 g/dL (ref 30.0–36.0)
MCV: 93.1 fL (ref 80.0–100.0)
Platelets: 275 10*3/uL (ref 150–400)
RBC: 3.47 MIL/uL — ABNORMAL LOW (ref 4.22–5.81)
RDW: 14.6 % (ref 11.5–15.5)
WBC: 11.3 10*3/uL — ABNORMAL HIGH (ref 4.0–10.5)
nRBC: 0 % (ref 0.0–0.2)

## 2023-04-02 LAB — URINE CULTURE

## 2023-04-02 LAB — BASIC METABOLIC PANEL
Anion gap: 12 (ref 5–15)
Anion gap: 10 (ref 5–15)
BUN: 23 mg/dL (ref 8–23)
BUN: 21 mg/dL (ref 8–23)
CO2: 22 mmol/L (ref 22–32)
CO2: 21 mmol/L — ABNORMAL LOW (ref 22–32)
Calcium: 8.5 mg/dL — ABNORMAL LOW (ref 8.9–10.3)
Calcium: 8.2 mg/dL — ABNORMAL LOW (ref 8.9–10.3)
Chloride: 107 mmol/L (ref 98–111)
Chloride: 109 mmol/L (ref 98–111)
Creatinine, Ser: 1.81 mg/dL — ABNORMAL HIGH (ref 0.61–1.24)
Creatinine, Ser: 1.8 mg/dL — ABNORMAL HIGH (ref 0.61–1.24)
GFR, Estimated: 40 mL/min — ABNORMAL LOW (ref >60)
GFR, Estimated: 40 mL/min — ABNORMAL LOW (ref >60)
Glucose, Bld: 126 mg/dL — ABNORMAL HIGH (ref 70–99)
Glucose, Bld: 192 mg/dL — ABNORMAL HIGH (ref 70–99)
Potassium: 2.7 mmol/L — CL (ref 3.5–5.1)
Potassium: 3.2 mmol/L — ABNORMAL LOW (ref 3.5–5.1)
Sodium: 141 mmol/L (ref 135–145)
Sodium: 140 mmol/L (ref 135–145)

## 2023-04-02 LAB — MAGNESIUM: Magnesium: 1.2 mg/dL — ABNORMAL LOW (ref 1.7–2.4)

## 2023-04-02 LAB — HIV ANTIBODY (ROUTINE TESTING W REFLEX): HIV Screen 4th Generation wRfx: NONREACTIVE

## 2023-04-02 SURGERY — SIGMOIDOSCOPY, FLEXIBLE
Anesthesia: Monitor Anesthesia Care

## 2023-04-02 MED ORDER — PROPOFOL 500 MG/50ML IV EMUL
INTRAVENOUS | Status: DC | PRN
  Administered 2023-04-02: 125 ug/kg/min via INTRAVENOUS

## 2023-04-02 MED ORDER — MAGNESIUM SULFATE 2 GM/50ML IV SOLN
2.0000 g | Freq: Once | INTRAVENOUS | Status: AC
  Administered 2023-04-02: 2 g via INTRAVENOUS
  Filled 2023-04-02: qty 50

## 2023-04-02 MED ORDER — HYDRALAZINE HCL 50 MG PO TABS: 50.0000 mg | ORAL_TABLET | Freq: Four times a day (QID) | ORAL | Status: DC | PRN

## 2023-04-02 MED ORDER — POTASSIUM CHLORIDE CRYS ER 20 MEQ PO TBCR
40.0000 meq | EXTENDED_RELEASE_TABLET | Freq: Once | ORAL | Status: AC
  Administered 2023-04-02: 40 meq via ORAL
  Filled 2023-04-02 (×2): qty 2

## 2023-04-02 MED ORDER — BOOST / RESOURCE BREEZE PO LIQD CUSTOM
1.0000 | Freq: Three times a day (TID) | ORAL | Status: DC
  Administered 2023-04-02 – 2023-04-10 (×21): 1 via ORAL

## 2023-04-02 MED ORDER — PROPOFOL 10 MG/ML IV BOLUS
INTRAVENOUS | Status: DC | PRN
  Administered 2023-04-02: 30 mg via INTRAVENOUS

## 2023-04-02 MED ORDER — SODIUM CHLORIDE 0.9 % IV SOLN: INTRAVENOUS | Status: DC

## 2023-04-02 NOTE — Progress Notes (Signed)
Initial Nutrition Assessment  DOCUMENTATION CODES:   Not applicable  INTERVENTION:  Boost Breeze po TID, each supplement provides 250 kcal and 9 grams of protein Monitor for diet advancement and will adjust nutrition supplements as appropriate  NUTRITION DIAGNOSIS:   Inadequate oral intake related to poor appetite, altered GI function as evidenced by per patient/family report.  GOAL:   Patient will meet greater than or equal to 90% of their needs  MONITOR:   PO intake, Supplement acceptance, Labs, Diet advancement, Weight trends  REASON FOR ASSESSMENT:   Consult Other (Comment) (nutrition goals)  ASSESSMENT:   Pt admitted with abdominal distension r/t Ogilvie's syndrome. PMH significant for remote spinal cord injury with paraplegia, CVA, HTN, HLD, stage 3a CKD and PVD s/p toe amputations.  4/25 - s/p flex sig- findings of dilated colon, consider rectal tube + reglan if ongoing distension; No etiology for cause noted  CT findings -max distention: sigmoid - 12.5cm, transverse - 8.5cm, descending - 7.6cm, ascending 7.0cm, caliber change in upper rectum.   Remains inpatient to monitor for recurrence of gaseous distension. Noted possible plans for d/c tomorrow if no recurrence.   Spoke with pt at bedside. He had just recently returned from flex sig. Son at bedside assisted with some nutrition related history although he reports having been out of town traveling recently so unable to provide many details.   Pt states that he has not had an appetite, time frame for how long this has occurred is unclear. He states that he has been eating significantly less than his baseline. Pt reports drinking Boost TID at home. His son states that he will usually have 1 with breakfast and then 2 at night before bed which is around 10 PM.   Pt is uncertain of his usual weight and whether he has had any fluctuations recently as he does not get weighed at his doctor's appointments. Documented weight  history within the last year is limited. Pt's weight appears to have been 94.4 kg on 04/01/22 and current admit weight note to be 104.3 kg, uncertain whether this is actual or stated weight. Will continue to monitor throughout admission.     Medications: colace, lovenox, klor-con, sodium bicarbonate, IV abx  Labs: potassium 2.7 (repleting), Cr 1.81, Mg 1.2 (repleting), GFR 40  NUTRITION - FOCUSED PHYSICAL EXAM:  Flowsheet Row Most Recent Value  Orbital Region No depletion  Upper Arm Region No depletion  Thoracic and Lumbar Region No depletion  Buccal Region No depletion  Temple Region No depletion  Clavicle Bone Region No depletion  Clavicle and Acromion Bone Region No depletion  Scapular Bone Region No depletion  Dorsal Hand No depletion  Patellar Region No depletion  [paraplegic]  Anterior Thigh Region No depletion  Posterior Calf Region No depletion  Edema (RD Assessment) None  Hair Reviewed  Eyes Reviewed  Mouth Reviewed  Skin Reviewed  Nails Reviewed       Diet Order:   Diet Order             Diet clear liquid Room service appropriate? Yes; Fluid consistency: Thin  Diet effective now                   EDUCATION NEEDS:   Education needs have been addressed  Skin:  Skin Assessment: Reviewed RN Assessment  Last BM:  4/25 (type 7  medium)  Height:   Ht Readings from Last 1 Encounters:  03/31/23 6' (1.829 m)    Weight:   Wt  Readings from Last 1 Encounters:  03/31/23 104.3 kg    Ideal Body Weight:  80.9 kg  BMI:  Body mass index is 31.19 kg/m.  Estimated Nutritional Needs:   Kcal:  2000-2200  Protein:  100-115g  Fluid:  >/=2L   Drusilla Kanner, RDN, LDN Clinical Nutrition

## 2023-04-02 NOTE — Interval H&P Note (Signed)
History and Physical Interval Note:  04/02/2023 8:36 AM  Terry Barton Sr.  has presented today for surgery, with the diagnosis of abnormal rectum on CT.  The various methods of treatment have been discussed with the patient and family. After consideration of risks, benefits and other options for treatment, the patient has consented to  Procedure(s): FLEXIBLE SIGMOIDOSCOPY (N/A) as a surgical intervention.  The patient's history has been reviewed, patient examined, no change in status, stable for surgery.  I have reviewed the patient's chart and labs.  Questions were answered to the patient's satisfaction.     Tressia [name]

## 2023-04-02 NOTE — Anesthesia Procedure Notes (Signed)
Procedure Name: MAC Date/Time: 04/02/2023 9:52 AM  Performed by: Randon Goldsmith, CRNAPre-anesthesia Checklist: Patient identified, Emergency Drugs available, Suction available and Patient being monitored Patient Re-evaluated:Patient Re-evaluated prior to induction Oxygen Delivery Method: Nasal cannula Preoxygenation: Pre-oxygenation with 100% oxygen Induction Type: IV induction

## 2023-04-02 NOTE — Progress Notes (Signed)
Date and time results received: 04/02/23 0807  Test: Potassium  Critical Value: 2.7  Name of Provider Notified: Krishnan,MD  Orders Received? Or Actions Taken?:  Awaiting message back with further instructions

## 2023-04-02 NOTE — Transfer of Care (Signed)
Immediate Anesthesia Transfer of Care Note  Patient: Terry Revere Sr.  Procedure(s) Performed: FLEXIBLE SIGMOIDOSCOPY  Patient Location: Endoscopy Unit  Anesthesia Type:MAC  Level of Consciousness: drowsy  Airway & Oxygen Therapy: Patient Spontanous Breathing and Patient connected to nasal cannula oxygen  Post-op Assessment: Report given to RN and Post -op Vital signs reviewed and stable  Post vital signs: Reviewed and stable  Last Vitals:  Vitals Value Taken Time  BP 106/64 04/02/23 1012  Temp    Pulse 65 04/02/23 1014  Resp 16 04/02/23 1014  SpO2 100 % 04/02/23 1014  Vitals shown include unvalidated device data.  Last Pain:  Vitals:   04/02/23 0842  TempSrc: Tympanic  PainSc: 0-No pain         Complications: No notable events documented.

## 2023-04-02 NOTE — Anesthesia Postprocedure Evaluation (Signed)
Anesthesia Post Note  Patient: Terry Barton.  Procedure(s) Performed: FLEXIBLE SIGMOIDOSCOPY     Patient location during evaluation: PACU Anesthesia Type: MAC Level of consciousness: awake and alert Pain management: pain level controlled Vital Signs Assessment: post-procedure vital signs reviewed and stable Respiratory status: spontaneous breathing, nonlabored ventilation and respiratory function stable Cardiovascular status: blood pressure returned to baseline Postop Assessment: no apparent nausea or vomiting Anesthetic complications: no   No notable events documented.  Last Vitals:  Vitals:   04/02/23 1025 04/02/23 1030  BP: 116/61 (!) 163/72  Pulse: 64 73  Resp: 18 14  Temp:    SpO2: 98% 99%    Last Pain:  Vitals:   04/02/23 1030  TempSrc:   PainSc: 0-No pain                 Shanda Howells

## 2023-04-02 NOTE — Progress Notes (Signed)
[facility] Progress Note  Patient: Terry Grenda Sr.    ZOX:[id number]  DOA: 03/31/2023    Date of Service: the patient was seen and examined on 04/02/2023  Brief hospital course: Patient is a 66 year old male with past medical history of remote spinal cord injury with paraplegia, stage IIIa CKD, hypertension, CVA and PVD status post multiple toe amputations who presented to the emergency room on the night of 4/23 with abdominal distention times several days.  Patient is unsure of when his last bowel movement was, but possibly the day before or 2.  CT scan of abdomen pelvis noted multiple distended loops throughout his colon without evidence of obstruction.  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  On morning of 4/25, patient underwent colonoscopy by gastroenterology with no findings in rectum to account for CT findings.  As much air as possible was removed from colon and GI plans to advance patient's diet to clear liquid.   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  No etiology for cause noted.  Essential hypertension: Blood pressure elevated, restarting home medications  Stage IIIb CKD: At baseline  Hypothyroidism: Continue Synthroid  History of spinal cord injury paraplegia: At baseline  Colonic polyp: Noted during flexible sigmoidoscopy.  Patient needs to have outpatient colonoscopy for follow-up and removal.  Discussed with family  Hypomagnesemia and hypokalemia: Replacing as needed  Hyperlipidemia: Resume statin  Obesity: Meets criteria BMI greater than 30  Body mass index is 31.19 kg/m.       Consultants: Gastroenterology  Procedures: Flexible sigmoidoscopy  Antimicrobials: None none  Code Status: Full code   Subjective: Patient seen post flex sig.  No complaints  Objective: Elevated blood pressures Vitals:   04/02/23 0842 04/02/23 1015  BP: (!) 170/96  106/64  Pulse: 71 65  Resp: 18 16  Temp: 97.9 F (36.6 C) (!) 97 F (36.1 C)  SpO2:  100%    Intake/Output Summary (Last 24 hours) at 04/02/2023 1024 Last data filed at 04/02/2023 1005 Gross per 24 hour  Intake 1135.58 ml  Output 2051 ml  Net -915.42 ml   Filed Weights   03/31/23 2353  Weight: 104.3 kg   Body mass index is 31.19 kg/m.  Exam:  General: Alert and oriented x 2-3, no acute distress HEENT: Normocephalic, medical mucous membranes are moist Cardiovascular: Regular rate and rhythm, S1-S2 Respiratory: Clear to auscultation bilaterally Abdomen: Soft, mildly distended, nontender, NABS Musculoskeletal: No clubbing or cyanosis, trace pitting edema Psychiatry: Appropriate, no evidence of psychosis  Data Reviewed: Potassium of 2.7, creatinine 1.8, magnesium 1.2, white blood cell count of 11.3  Disposition:  Status is: Inpatient Remains inpatient appropriate because:  -Follow-up on electrolytes -Monitor for another 24 hours to ensure no recurrence of gaseous distention    Anticipated discharge date: 4/26  Family Communication: Sons at the bedside DVT Prophylaxis: enoxaparin (LOVENOX) injection 40 mg Start: 04/02/23 2200    Author: Hollice Espy ,MD 04/02/2023 10:24 AM  To reach On-call, see care teams to locate the attending and reach out via [email or web address]. Between 7PM-7AM, please contact night-coverage If you still have difficulty reaching the attending provider, please page the [facility] (Director on Call) for [facility] on amion for assistance.

## 2023-04-02 NOTE — TOC Initial Note (Addendum)
Transition of Care (TOC) - Initial/Assessment Note   Spoke to patient and both sons Terry Barton and Terry Barton at bedside. Patient from home. Confirmed address.   Patient has motorized wheel chair , hospital bed, hoyer lift , and bedside commode at home.   Patient has wheel chair Terry Barton and has transportation to appointments. However, patient will need PTAR transportation home at DC.  No PT follow up or DME recommendation   Patient Details  Name: Terry Krogh Sr. MRN: [id number] Date of Birth: [birth year]  Transition of Care [facility]) CM/SW Contact:    Kingsley Plan, RN Phone Number: 04/02/2023, 1:57 PM  Clinical Narrative:                   Expected Discharge Plan: Home/Self Care Barriers to Discharge: Continued Medical Work up   Patient Goals and CMS Choice Patient states their goals for this hospitalization and ongoing recovery are:: to return to home     [place] ownership interest in [facility].provided to:: Patient    Expected Discharge Plan and Services   Discharge Planning Services: CM Consult Post Acute Care Choice: NA                     DME Agency: NA       HH Arranged: NA          Prior Living Arrangements/Services   Lives with:: Adult Children Patient language and need for interpreter reviewed:: Yes Do you feel safe going Barton to the place where you live?: Yes      Need for Family Participation in Patient Care: Yes (Comment) Care giver support system in place?: Yes (comment) Current home services: DME Criminal Activity/Legal Involvement Pertinent to Current Situation/Hospitalization: No - Comment as needed  Activities of Daily Living      Permission Sought/Granted   Permission granted to share information with : Yes, Verbal Permission Granted  Share Information with NAME: Terry Barton and Terry Barton sons           Emotional Assessment Appearance:: Appears stated age Attitude/Demeanor/Rapport: Engaged Affect (typically observed):  Accepting Orientation: : Oriented to Self, Oriented to Place, Oriented to  Time, Oriented to Situation Alcohol / Substance Use: Not Applicable Psych Involvement: No (comment)  Admission diagnosis:  Abdominal distention [R14.0] Ogilvie's syndrome [K59.81] Patient Active Problem List   Diagnosis Date Noted   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   Abnormality of rectum 04/01/2023   Abnormal CT of the abdomen 04/01/2023   Abdominal distension 04/01/2023   Osteomyelitis 06/01/2022   Hypothyroidism 06/01/2022   Stage 3b chronic kidney disease (CKD) 06/01/2022   Macrocytic anemia 03/30/2022   Moderate protein-calorie malnutrition 03/30/2022   Metabolic acidosis, normal anion gap (NAG) 03/30/2022   Biliary sludge 03/30/2022   Gastroenteritis 03/30/2022   Essential hypertension 05/17/2020   Hyperlipidemia 05/17/2020   Acute renal failure superimposed on stage 3a chronic kidney disease 05/17/2020   Hypokalemia 05/17/2020   ICH (intracerebral hemorrhage) (HCC) d/t HTN 05/12/2020   Diabetes mellitus 10/18/2019   PCP:  Terry Back, NP Pharmacy:   [id number] - [place], [place] - [address] AT [facility] OF [address] & [address] [place] [address] Phone: [phone] Fax: [phone]  [facility] [address] [place] [address] Phone: [phone] Fax: [phone]     Social Determinants of Health (SDOH) Social History: SDOH Screenings   Tobacco Use: Low Risk  (04/02/2023)   SDOH Interventions:  Readmission Risk Interventions     No data to display

## 2023-04-02 NOTE — Progress Notes (Signed)
OT Cancellation Note  Patient Details Name: Terry Witter Sr. MRN: [id number] DOB: 18-Nov-1956   Cancelled Treatment:    Reason Eval/Treat Not Completed: Patient at procedure or test/ unavailable. Pt off unit for sigmoidoscopy. OT will follow up next available time as appropriate  Galen Manila 04/02/2023, 9:05 AM

## 2023-04-02 NOTE — Progress Notes (Signed)
Made Dr. Stephannie Peters aware of 2.7 K level this am. Pt scheduled for KCL tablet 40 at 10 am per Dr. Stephannie Peters go ahead and give tablet and start new IV 918 or 20 gauge) KMB

## 2023-04-02 NOTE — Op Note (Addendum)
[facility] Patient Name: Terry Barton Procedure Date : 04/02/2023 MRN: [id number] Attending MD: Tressia [name] MD, MD, [id number] Date of Birth: 07/31/1956 CSN: [id number] Age: 67 Admit Type: Inpatient Procedure:                Flexible Sigmoidoscopy Indications:              Abnormal CT of the GI tract Providers:                Tressia [name] MD, MD, Geralyn Corwin, RN, Harrington Challenger, Technician Referring MD:              Medicines:                Monitored Anesthesia Care Complications:            No immediate complications. Estimated Blood Loss:     Estimated blood loss: none. Procedure:                Pre-Anesthesia Assessment:                           - Prior to the procedure, a History and Physical                            was performed, and patient medications and                            allergies were reviewed. The patient's tolerance of                            previous anesthesia was also reviewed. The risks                            and benefits of the procedure and the sedation                            options and risks were discussed with the patient.                            All questions were answered, and informed consent                            was obtained. Prior Anticoagulants: The patient has                            taken Lovenox (enoxaparin), last dose was 1 day                            prior to procedure. ASA Grade Assessment: III - A                            patient with severe systemic disease. After  reviewing the risks and benefits, the patient was                            deemed in satisfactory condition to undergo the                            procedure.                           After obtaining informed consent, the scope was                            passed under direct vision. The PCF-HQ190L                            ([id number]) Olympus colonoscope was  introduced                            through the anus and advanced to the the sigmoid                            colon. The flexible sigmoidoscopy was accomplished                            without difficulty. The patient tolerated the                            procedure well. The quality of the bowel                            preparation was good. Scope In: 9:56:54 AM Scope Out: 10:04:29 AM Total Procedure Duration: 0 hours 7 minutes 35 seconds  Findings:      The perianal and digital rectal examinations were normal. Residual stool       and food fibers present in the distal rectum.      A 10 mm polyp was found in the distal rectum. The polyp was sessile. The       polyp was not removed.      The lumen of the entire examined colon was grossly dilated. No mucosal       abnormalities seen. I removed as much air as possible during the       procedure. Impression:               - One 10 mm polyp in the distal rectum. This was                            not resected today.                           - Dilated colon above the anus. No mucosal                            abnormalities seen. Decompression performed. Recommendation:           - Return patient to hospital ward for ongoing care.                           -  Clear liquid diet today.                           - Consider rectal tube and serial rectal enemas if                            distension recurs.                           - Consider Reglan is distension recurs.                           - May resume Lovenox.                           - Full colonoscopy to include polypectomy as an                            outpatient. Procedure Code(s):        --- Professional ---                           [phone], Sigmoidoscopy, flexible; diagnostic,                            including collection of specimen(s) by brushing or                            washing, when performed (separate procedure) Diagnosis Code(s):        ---  Professional ---                           D12.8, Benign neoplasm of rectum                           K59.39, Other megacolon                           R93.3, Abnormal findings on diagnostic imaging of                            other parts of digestive tract CPT copyright 2022 American Medical Association. All rights reserved. The codes documented in this report are preliminary and upon coder review may  be revised to meet current compliance requirements. Tressia [name] MD, MD 04/02/2023 10:20:27 AM This report has been signed electronically. Number of Addenda: 0

## 2023-04-03 DIAGNOSIS — E876 Hypokalemia: Secondary | ICD-10-CM

## 2023-04-03 LAB — URINE CULTURE: Culture: >=100000 — AB

## 2023-04-03 LAB — BASIC METABOLIC PANEL
Anion gap: 13 (ref 5–15)
BUN: 20 mg/dL (ref 8–23)
CO2: 18 mmol/L — ABNORMAL LOW (ref 22–32)
Calcium: 8.2 mg/dL — ABNORMAL LOW (ref 8.9–10.3)
Chloride: 109 mmol/L (ref 98–111)
Creatinine, Ser: 1.7 mg/dL — ABNORMAL HIGH (ref 0.61–1.24)
GFR, Estimated: 43 mL/min — ABNORMAL LOW (ref >60)
Glucose, Bld: 115 mg/dL — ABNORMAL HIGH (ref 70–99)
Potassium: 3.2 mmol/L — ABNORMAL LOW (ref 3.5–5.1)
Sodium: 140 mmol/L (ref 135–145)

## 2023-04-03 LAB — MAGNESIUM: Magnesium: 1.3 mg/dL — ABNORMAL LOW (ref 1.7–2.4)

## 2023-04-03 MED ORDER — BISACODYL 5 MG PO TBEC: 5.0000 mg | DELAYED_RELEASE_TABLET | Freq: Every day | ORAL | 0 refills | Status: DC | PRN

## 2023-04-03 MED ORDER — POLYETHYLENE GLYCOL 3350 17 G PO PACK: 17.0000 g | PACK | Freq: Every day | ORAL | 0 refills | Status: DC

## 2023-04-03 MED ORDER — CIPROFLOXACIN HCL 500 MG PO TABS: 500.0000 mg | ORAL_TABLET | Freq: Every day | ORAL | 0 refills | Status: DC

## 2023-04-03 MED ORDER — MAGNESIUM SULFATE 50 % IJ SOLN
3.0000 g | Freq: Once | INTRAVENOUS | Status: AC
  Administered 2023-04-03: 3 g via INTRAVENOUS
  Filled 2023-04-03: qty 6

## 2023-04-03 NOTE — Progress Notes (Signed)
    Progress Note  Primary GI: unassigned  LOS: 2 days   Chief Complaint: Ogilvie's syndrome   Subjective   Patient states he is feeling much better.  Feels like his belly is back to normal and states his discomfort has improved.  No bowel movements.  Denies nausea/vomiting.  Tolerating clear liquids without difficulty  No family was present at the time of my evaluation.    Objective   Vital signs in last 24 hours: Temp:  [98 F (36.7 C)-99.2 F (37.3 C)] 98.4 F (36.9 C) (04/26 0728) Pulse Rate:  [66-73] 66 (04/26 0728) Resp:  [18-19] 18 (04/26 0728) BP: (150-169)/(63-76) 154/76 (04/26 0728) SpO2:  [97 %-100 %] 100 % (04/26 0728) Last BM Date : 04/03/23 Last BM recorded by nurses in past 5 days Stool Type: Type 6 (Mushy consistency with ragged edges) (04/03/2023 11:00 AM)  General:   male in no acute distress  Heart:  Regular rate and rhythm; no murmurs Pulm: Clear anteriorly; no wheezing Abdomen: soft, distended, normal bowel sounds.  Nontender.. Neurologic:  Alert and  oriented x4;  No focal deficits.  Psych:  Cooperative. Normal mood and affect.  Intake/Output from previous day: 04/25 0701 - 04/26 0700 In: 2769 [P.O.:720; I.V.:1899; IV Piggyback:150] Out: 1701 [Urine:1700; Blood:1] Intake/Output this shift: Total I/O In: 686 [P.O.:480; IV Piggyback:206] Out: 600 [Urine:600]  Studies/Results: No results found.  Lab Results: Recent Labs    04/01/23 0048 04/02/23 0709  WBC 12.0* 11.3*  HGB 11.6* 10.6*  HCT 36.7* 32.3*  PLT 302 275   BMET Recent Labs    04/02/23 0709 04/02/23 1524 04/03/23 0411  NA 141 140 140  K 2.7* 3.2* 3.2*  CL 107 109 109  CO2 22 21* 18*  GLUCOSE 126* 192* 115*  BUN 23 21 20   CREATININE 1.81* 1.80* 1.70*  CALCIUM 8.5* 8.2* 8.2*   LFT Recent Labs    04/01/23 0048  PROT 7.1  ALBUMIN 3.2*  AST 25  ALT 20  ALKPHOS 101  BILITOT 0.8   PT/INR Recent Labs    04/01/23 0048  LABPROT 14.2  INR 1.1     Scheduled  Meds:  allopurinol  100 mg Oral Daily   amLODipine  5 mg Oral Daily   atorvastatin  20 mg Oral QHS   Chlorhexidine Gluconate Cloth  6 each Topical Daily   docusate sodium  100 mg Oral BID   enoxaparin (LOVENOX) injection  40 mg Subcutaneous Q24H   feeding supplement  1 Container Oral TID BM   levothyroxine  50 mcg Oral Q0600   potassium chloride SA  40 mEq Oral BID   sodium bicarbonate  1,300 mg Oral TID   Continuous Infusions:  cefTRIAXone (ROCEPHIN)  IV 1 g (04/03/23 0541)   lactated ringers 75 mL/hr at 04/02/23 1946      Impression:   Ogilvie's syndrome - CT abdomen pelvis with contrast showed multiple distended loops gas-filled transverse, descending, and sigmoid colon without evidence of obstruction.  Mild wall thickening and perirectal fat stranding noted (possible proctitis), cholelithiasis - Flexible sigmoidoscopy: showed 10 mm polyp in distal rectum, not resected.  Dilated colon above the anus, no mucosal abnormality seen.  Decompression performed.  CKD   Plan:   -Consider rectal tube and serial rectal enemas if distention recurs.   -Consider Reglan if distention recurs -Will need full colonoscopy as an outpatient  Terry Barton Terry Barton  04/03/2023, 1:31 PM

## 2023-04-03 NOTE — Evaluation (Signed)
Occupational Therapy Evaluation Patient Details Name: Terry On Sr. MRN: [id number] DOB: 1956/04/01 Today's Date: 04/03/2023   History of Present Illness 67 y.o. male presents to [facility] hospital on 03/31/2023 with abdominal distention. CT abdomen shows multiple distended loops without obstruction. PMH includes ICH, DM, CKD, hypothyroidism, HTN, HLD.   Clinical Impression   Pt is total currently at at baseline with bathing, dressing and toileting. Uses hoyer for transfers and power w/c for mobility. Pt is set up with self feeding using standard utensils and plates, using L UE, set up with simple grooming and oral hygiene.Pt presents to PT at or near his baseline. At [facility] pt is total A for most ADLs due to R hemiplegia and significant ROM/strength deficits in L side as well. Pt's family assist him in ADLs and hoyer lift transfers to a power wheelchair pt is able to mobilize independently within the home. All education is completed and no further acute OT services are indicated at this time. OT will sign off     Recommendations for follow up therapy are one component of a multi-disciplinary discharge planning process, led by the attending physician.  Recommendations may be updated based on patient status, additional functional criteria and insurance authorization.   Assistance Recommended at Discharge Frequent or constant Supervision/Assistance  Patient can return home with the following A lot of help with bathing/dressing/bathroom;Two people to help with walking and/or transfers;Direct supervision/assist for medications management;Direct supervision/assist for financial management;Assist for transportation    Functional Status Assessment  Patient has not had a recent decline in their functional status  Equipment Recommendations  None recommended by OT    Recommendations for Other Services       Precautions / Restrictions Precautions Precautions: Fall Precaution Comments: hemiplegia on R with  devloped chronic weakness and ROM deficits on left side Restrictions Weight Bearing Restrictions: No      Mobility Bed Mobility               General bed mobility comments: pt declines bed mobility at this time, pt reports that he does not perform this task at home and that his son/caregivers turn him in bed    Transfers                   General transfer comment: pt declines OOB transfer with lift      Balance                                           ADL either performed or assessed with clinical judgement   ADL Overall ADL's : At baseline                                       General ADL Comments: Pt is total currently at at baseline with bathing, dressing and toileting. Uses hoyer for transfers and power w/c for mobility. Pt is set up with self feeding using standard utensils and plates, set up with simple grooming and oral hygiene     Vision Ability to See in Adequate Light: 0 Adequate Patient Visual Report: No change from baseline       Perception     Praxis      Pertinent Vitals/Pain Pain Assessment Pain Assessment: No/denies pain     Hand Dominance Left  Extremity/Trunk Assessment Upper Extremity Assessment Upper Extremity Assessment: Generalized weakness;LUE deficits/detail;RUE deficits/detail RUE Deficits / Details: pt with extremely limited shoulder ROM, only a few degrees in each direction. Pt is able to extend elbow actively to ~90 degrees and passively to ~110 degrees. Digit ROM WFL, strength 3-/5 in hand LUE Deficits / Details: pt is able to flex shoulder ~80 degrees actively and ~100 passively, abducts shoulder to ~80 degrees, elbow extension to ~100 degrees. Digit ROM WFL including opposition, strength 4-/5 in digits.   Lower Extremity Assessment Lower Extremity Assessment: Defer to PT evaluation       Communication Communication Communication: HOH   Cognition Arousal/Alertness:  Awake/alert Behavior During Therapy: WFL for tasks assessed/performed Overall Cognitive Status: Within Functional Limits for tasks assessed                                       General Comments       Exercises     Shoulder Instructions      Home Living Family/patient expects to be discharged to:: Private residence Living Arrangements: Alone Available Help at Discharge: Family;Available 24 hours/day Type of Home: House Home Access: Ramped entrance     Home Layout: Able to live on main level with bedroom/bathroom     Bathroom Shower/Tub:  (pt is bathed at bed level)         Home Equipment: Other (comment);Hospital bed;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 ADLs Comments: family assists with all ADLs, toileting, bathing and dressing at bed level. Pt is Ind with self feeding using standard utensils and set up with grooming and hygiene        OT Problem List: Decreased strength;Decreased range of motion;Decreased coordination;Obesity;Impaired tone;Decreased activity tolerance      OT Treatment/Interventions:      OT Goals(Current goals can be found in the care plan section) Acute Rehab OT Goals Patient Stated Goal: "go home when they say I can"  OT Frequency:      Co-evaluation              AM-PAC OT "6 Clicks" Daily Activity     Outcome Measure Help from another person eating meals?: A Little (set up) Help from another person taking care of personal grooming?: A Little (set up) Help from another person toileting, which includes using toliet, bedpan, or urinal?: Total Help from another person bathing (including washing, rinsing, drying)?: Total Help from another person to put on  and taking off regular upper body clothing?: Total Help from another person to put on and taking off regular lower body clothing?: Total 6 Click Score: 10   End of Session    Activity Tolerance: Patient tolerated treatment well Patient left: in bed;with call bell/phone within reach  OT Visit Diagnosis: Other symptoms and signs involving the nervous system (M01.027)                Time: 2536-6440 OT Time Calculation (min): 16 min Charges:  OT General Charges $OT Visit: 1 Visit OT Evaluation $OT Eval Moderate Complexity: 1 Mod    Margaretmary Eddy  Jeanette 04/03/2023, 1:49 PM

## 2023-04-03 NOTE — Progress Notes (Signed)
[facility] Progress Note  Patient: Terry Bellin Sr.    WNU:[id number]  DOA: 03/31/2023    Date of Service: the patient was seen and examined on 04/03/2023  Brief hospital course: Patient is a 67 year old male with past medical history of remote spinal cord injury with paraplegia, stage IIIa CKD, hypertension, CVA and PVD status post multiple toe amputations who presented to the emergency room on the night of 4/23 with abdominal distention times several days.  Patient is unsure of when his last bowel movement was, but possibly the day before or 2.  CT scan of abdomen pelvis noted multiple distended loops throughout his colon without evidence of obstruction.  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  On morning of 4/25, patient underwent colonoscopy by gastroenterology with no findings in rectum to account for CT findings.  As much air as possible was removed from colon and GI plans to advance patient's diet to clear liquid.   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  No etiology for cause noted.  Advancing diet.  Essential hypertension: Blood pressure elevated, restarting home medications.  As needed hydralazine  Stage IIIb CKD: At baseline  Hypothyroidism: Continue Synthroid  History of spinal cord injury paraplegia: At baseline  Colonic polyp: Noted during flexible sigmoidoscopy.  Patient needs to have outpatient colonoscopy for follow-up and removal.  Discussed with family  Hypomagnesemia and hypokalemia: Replacing as needed, recheck labs in the morning  Hyperlipidemia: Resume statin  Obesity: Meets criteria BMI greater than 30  Body mass index is 31.19 kg/m.  Nutrition Problem: Inadequate oral intake Etiology: poor appetite, altered GI function    Consultants: Gastroenterology  Procedures: Flexible sigmoidoscopy  Antimicrobials: None none  Code Status: Full  code   Subjective: Feeling okay overall, slightly anxious  Objective: Elevated blood pressures Vitals:   04/03/23 0521 04/03/23 0728  BP: (!) 153/67 (!) 154/76  Pulse: 66 66  Resp: 18 18  Temp: 98.8 F (37.1 C) 98.4 F (36.9 C)  SpO2: 97% 100%    Intake/Output Summary (Last 24 hours) at 04/03/2023 1447 Last data filed at 04/03/2023 1400 Gross per 24 hour  Intake 3515 ml  Output 3300 ml  Net 215 ml    Filed Weights   03/31/23 2353  Weight: 104.3 kg   Body mass index is 31.19 kg/m.  Exam:  General: Alert and oriented x 2-3, no acute distress HEENT: Normocephalic, medical mucous membranes are moist Cardiovascular: Regular rate and rhythm, S1-S2 Respiratory: Clear to auscultation bilaterally Abdomen: Soft, mildly distended, nontender, NABS Musculoskeletal: No clubbing or cyanosis, trace pitting edema Psychiatry: Appropriate, no evidence of psychosis  Data Reviewed: Potassium of 2.7, creatinine 1.7, magnesium 1.3  Disposition:  Status is: Inpatient Remains inpatient appropriate because:  -Continue to follow electrolytes -Advancing diet -Monitor for another 24 hours to ensure no recurrence of gaseous distention    Anticipated discharge date: 4/27  Family Communication: Updated son by phone DVT Prophylaxis: enoxaparin (LOVENOX) injection 40 mg Start: 04/02/23 2200    Author: Hollice Espy ,MD 04/03/2023 2:47 PM  To reach On-call, see care teams to locate the attending and reach out via [email or web address]. Between 7PM-7AM, please contact night-coverage If you still have difficulty reaching the attending provider, please page the [facility] (Director on Call) for [facility] on amion for assistance.

## 2023-04-03 NOTE — Progress Notes (Signed)
Foley catheter present. Pt states he does not use foley catheter at home, he usually wears briefs. Virginia Rochester, MD made aware. Awaiting MD response to see if foley will be continued at discharge or not.   Pt states he is not comfortable discharging home today. Virginia Rochester, MD notified and to come to bedside to speak with pt.

## 2023-04-03 NOTE — Progress Notes (Signed)
Discharge orders received. Spoke to Dr. Rito Ehrlich to confirm that patient is being discharged home with foley. Reviewed order to continue foley, primary nurse notified.

## 2023-04-03 NOTE — Plan of Care (Signed)

## 2023-04-04 ENCOUNTER — Inpatient Hospital Stay (HOSPITAL_COMMUNITY): Payer: Medicare HMO

## 2023-04-04 LAB — BASIC METABOLIC PANEL
Anion gap: 11 (ref 5–15)
BUN: 17 mg/dL (ref 8–23)
CO2: 21 mmol/L — ABNORMAL LOW (ref 22–32)
Calcium: 8.2 mg/dL — ABNORMAL LOW (ref 8.9–10.3)
Chloride: 107 mmol/L (ref 98–111)
Creatinine, Ser: 1.73 mg/dL — ABNORMAL HIGH (ref 0.61–1.24)
GFR, Estimated: 42 mL/min — ABNORMAL LOW (ref >60)
Glucose, Bld: 143 mg/dL — ABNORMAL HIGH (ref 70–99)
Potassium: 3.1 mmol/L — ABNORMAL LOW (ref 3.5–5.1)
Sodium: 139 mmol/L (ref 135–145)

## 2023-04-04 LAB — MAGNESIUM: Magnesium: 1.4 mg/dL — ABNORMAL LOW (ref 1.7–2.4)

## 2023-04-04 MED ORDER — METOCLOPRAMIDE HCL 5 MG PO TABS
5.0000 mg | ORAL_TABLET | Freq: Three times a day (TID) | ORAL | Status: DC
  Administered 2023-04-04 – 2023-04-06 (×7): 5 mg via ORAL
  Filled 2023-04-04 (×7): qty 1

## 2023-04-04 MED ORDER — MAGNESIUM SULFATE 4 GM/100ML IV SOLN
4.0000 g | Freq: Once | INTRAVENOUS | Status: AC
  Administered 2023-04-04: 4 g via INTRAVENOUS
  Filled 2023-04-04: qty 100

## 2023-04-04 MED ORDER — FUROSEMIDE 10 MG/ML IJ SOLN
20.0000 mg | Freq: Once | INTRAMUSCULAR | Status: AC
  Administered 2023-04-04: 20 mg via INTRAVENOUS
  Filled 2023-04-04: qty 2

## 2023-04-04 MED ORDER — BISACODYL 10 MG RE SUPP
10.0000 mg | Freq: Every day | RECTAL | Status: DC
  Administered 2023-04-09: 10 mg via RECTAL
  Filled 2023-04-04 (×3): qty 1

## 2023-04-04 NOTE — TOC Transition Note (Signed)
Transition of Care [facility]) - CM/SW Discharge Note   Patient Details  Name: Terry Stavros Sr. MRN: [id number] Date of Birth: [birth year]  Transition of Care [facility]) CM/SW Contact:  Tom-Johnson, Hershal Coria, RN Phone Number: 04/04/2023, 3:31 PM   Clinical Narrative:     Patient is scheduled for discharge today.  Readmission Prevention Assessment done.  Hospital f/u and discharge instructions on AVS.  No PT/OT f/u noted. Prescription sent to [facility] pharmacy and meds to be delivered to patient at bedside prior to discharge.  PTAR scheduled for 6 pm as patient needs to void prior discharge.   No further TOC needs noted.    Final next level of care: Home/Self Care Barriers to Discharge: Barriers Resolved   Patient Goals and CMS Choice CMS Medicare.gov Compare Post Acute Care list provided to:: Patient    Discharge Placement                  Patient to be transferred to facility by: PTAR      Discharge Plan and Services Additional resources added to the After Visit Summary for     Discharge Planning Services: CM Consult Post Acute Care Choice: NA            DME Agency: NA       HH Arranged: NA          Social Determinants of Health (SDOH) Interventions SDOH Screenings   Tobacco Use: Low Risk  (04/02/2023)     Readmission Risk Interventions    04/04/2023    3:30 PM  Readmission Risk Prevention Plan  Transportation Screening Complete  PCP or Specialist Appt within 5-7 Days Complete  Home Care Screening Complete  Medication Review (RN CM) Referral to Pharmacy

## 2023-04-04 NOTE — Discharge Summary (Signed)
Physician Discharge Summary   Patient: Terry Glazier Sr. MRN: [id number] DOB: 03-04-1956  Admit date:     03/31/2023  Discharge date: 04/04/23  Discharge Physician: Hollice Espy   PCP: Loura Back, NP   Recommendations at discharge:   New medication: Cipro 500 mg p.o. daily x 5 days Patient will follow-up with his gastroenterologist for colonoscopy for polyp removal  Discharge Diagnoses: Principal Problem:   Ogilvie's syndrome Active Problems:   Essential hypertension   Hyperlipidemia   Hypothyroidism   Stage 3b chronic kidney disease (CKD) (HCC)   Abnormality of rectum   Abnormal CT of the abdomen   Abdominal distension  Resolved Problems:   * No resolved hospital problems. [facility] Course: Patient is a 67 year old male with past medical history of remote spinal cord injury with paraplegia, stage IIIa CKD, hypertension, CVA and PVD status post multiple toe amputations who presented to the emergency room on the night of 4/23 with abdominal distention times several days.  Patient is unsure of when his last bowel movement was, but possibly the day before or 2.  CT scan of abdomen pelvis noted multiple distended loops throughout his colon without evidence of obstruction.  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   On morning of 4/25, patient underwent colonoscopy by gastroenterology with no findings in rectum to account for CT findings.  As much air as possible was removed from colon and GI plans to advance patient's diet to clear liquid.  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  No etiology for cause noted.  Tolerated advancement of diet to solid food.   Essential hypertension: Blood pressure elevated, restarting home medications.  As needed hydralazine   Stage IIIb CKD: At baseline   Hypothyroidism: Continue Synthroid   History of spinal cord injury paraplegia: At  baseline   Colonic polyp: Noted during flexible sigmoidoscopy.  Patient needs to have outpatient colonoscopy for follow-up and removal.  Discussed with family   Hypomagnesemia and hypokalemia: Replaced as needed  Proctitis: Noted on imaging on admission.  Patient without complaint or discomfort, although does have paraplegia.  Had received IV Rocephin x 3 days.  Prescription given for Cipro x 5 days.   Hyperlipidemia: Resume statin   Obesity: Meets criteria BMI greater than 30   Body mass index is 31.19 kg/m.  Nutrition Problem: Inadequate oral intake Etiology: poor appetite, altered GI function        Consultants: Gastroenterology Procedures performed: Flexible sigmoidoscopy Disposition: Home Diet recommendation:  Discharge Diet Orders (From admission, onward)     Start     Ordered   04/03/23 0000  Diet - low sodium heart healthy        04/03/23 1155           Cardiac diet DISCHARGE MEDICATION: Allergies as of 04/04/2023   No Known Allergies      Medication List     TAKE these medications    allopurinol 100 MG tablet Commonly known as: ZYLOPRIM Take 100 mg by mouth daily.   amLODipine 5 MG tablet Commonly known as: NORVASC Take 1 tablet (5 mg total) by mouth daily.   atorvastatin 20 MG tablet Commonly known as: LIPITOR Take 20 mg by mouth at bedtime.   bisacodyl 5 MG EC tablet Commonly known as: DULCOLAX Take 1 tablet (5 mg total) by mouth daily as needed for moderate constipation.   ciprofloxacin 500 MG tablet Commonly known as: Cipro Take  1 tablet (500 mg total) by mouth daily with breakfast for 5 days.   levothyroxine 50 MCG tablet Commonly known as: SYNTHROID Take 50 mcg by mouth.   oxyCODONE 5 MG immediate release tablet Commonly known as: Oxy IR/ROXICODONE Take 1 tablet (5 mg total) by mouth 3 (three) times daily as needed for moderate pain or severe pain.   polyethylene glycol 17 g packet Commonly known as: MIRALAX / GLYCOLAX Take  17 g by mouth daily.   potassium chloride SA 20 MEQ tablet Commonly known as: KLOR-CON M Take 40 mEq by mouth 2 (two) times daily.   sodium bicarbonate 650 MG tablet Take 2 tablets (1,300 mg total) by mouth 3 (three) times daily.   traZODone 50 MG tablet Commonly known as: DESYREL Take 1 tablet (50 mg total) by mouth at bedtime as needed for sleep.        Discharge Exam: Filed Weights   03/31/23 2353  Weight: 104.3 kg   General: Alert and oriented x 3, no acute distress Cardiovascular: Regular rate and rhythm, S1-S2  Condition at discharge: good  The results of significant diagnostics from this hospitalization (including imaging, microbiology, ancillary and laboratory) are listed below for reference.   Imaging Studies: CT ABDOMEN PELVIS W CONTRAST  Addendum Date: 04/01/2023   ADDENDUM REPORT: 04/01/2023 20:04 ADDENDUM: The sigmoid colon measures up to 12.3 cm in diameter. The transverse colon measures up to 7.9 cm the descending colon measures up to 6.1 cm. The ascending colon is normal in caliber. Electronically Signed   By: Thornell Sartorius M.D.   On: 04/01/2023 20:04   Result Date: 04/01/2023 CLINICAL DATA:  Distended abdomen, pitting edema in bilateral lower extremities. EXAM: CT ABDOMEN AND PELVIS WITH CONTRAST TECHNIQUE: Multidetector CT imaging of the abdomen and pelvis was performed using the standard protocol following bolus administration of intravenous contrast. RADIATION DOSE REDUCTION: This exam was performed according to the departmental dose-optimization program which includes automated exposure control, adjustment of the mA and/or kV according to patient size and/or use of iterative reconstruction technique. CONTRAST:  60mL OMNIPAQUE IOHEXOL 350 MG/ML SOLN COMPARISON:  03/29/2022. FINDINGS: Lower chest: There is a small to moderate right pleural effusion with compressive atelectasis in the right lower lobe. Mild atelectasis or scarring is present at the left lung base.  Multi-vessel coronary artery calcifications are noted. Hepatobiliary: No focal abnormality in the liver. No biliary ductal dilation. Stones and hyperdense material are noted in the gallbladder. Pancreas: Unremarkable. No pancreatic ductal dilatation or surrounding inflammatory changes. Spleen: Normal in size without focal abnormality. Adrenals/Urinary Tract: Adrenal glands are within normal limits. Renal atrophy is noted bilaterally. The kidneys enhance symmetrically. There is a cyst in the lower pole of the right kidney. Subcentimeter hypodensity is noted in the left kidney which is too small to further characterize. No renal calculus or hydronephrosis. The bladder is unremarkable. Stomach/Bowel: There is a small hiatal hernia. The stomach is within normal limits. Multiple distended gas-filled loops of transverse colon, descending colon and sigmoid colon without evidence of obstruction. The appendix is within normal limits. The small bowel is normal in caliber. There is mild rectal wall thickening with perirectal fat stranding. Vascular/Lymphatic: No significant vascular findings are present. No enlarged abdominal or pelvic lymph nodes. Reproductive: Prostate gland is mildly enlarged. Other: No abdominopelvic ascites. Musculoskeletal: Degenerative changes are present in the thoracolumbar spine. No acute osseous abnormality. IMPRESSION: 1. Multiple distended loops gas-filled transverse, descending, and sigmoid colon without evidence of obstruction. Mild rectal wall thickening and  perirectal fat stranding is noted, possible proctitis. 2. Small to moderate right pleural effusion with atelectasis at the lung bases. 3. Cholelithiasis. 4. Small hiatal hernia. 5. Aortic atherosclerosis and coronary artery calcifications. Electronically Signed: By: Thornell Sartorius M.D. On: 04/01/2023 02:38   DG Chest Portable 1 View  Result Date: 04/01/2023 CLINICAL DATA:  Evaluation for fluid retention. EXAM: PORTABLE CHEST 1 VIEW  COMPARISON:  March 29, 2022 FINDINGS: The heart size and mediastinal contours are within normal limits. Low lung volumes are noted. There is no evidence of focal consolidation. A small layering right pleural effusion is suspected. No pneumothorax is identified. Air-filled loops of distended bowel are suspected within the visualized portion of the upper abdomen. Multilevel degenerative changes are seen throughout the thoracic spine. IMPRESSION: 1. Low lung volumes with a small layering right pleural effusion. 2. A small bowel obstruction versus ileus cannot be excluded. Further correlation with abdomen pelvis CT is recommended. Electronically Signed   By: Aram Candela M.D.   On: 04/01/2023 01:07    Microbiology: Results for orders placed or performed during the hospital encounter of 03/31/23  Urine Culture     Status: Abnormal   Collection Time: 04/01/23  5:27 AM   Specimen: Urine, Catheterized  Result Value Ref Range Status   Specimen Description URINE, CATHETERIZED  Final   Special Requests   Final    NONE Performed at [facility], [address] [place], [place] [address]    Culture >=100,000 COLONIES/mL PROVIDENCIA STUARTII (A)  Final   Report Status 04/03/2023 FINAL  Final   Organism ID, Bacteria PROVIDENCIA STUARTII (A)  Final      Susceptibility   Providencia stuartii - MIC*    AMPICILLIN >=32 RESISTANT Resistant     CEFEPIME <=0.12 SENSITIVE Sensitive     CEFTRIAXONE <=0.25 SENSITIVE Sensitive     CIPROFLOXACIN <=0.25 SENSITIVE Sensitive     GENTAMICIN RESISTANT Resistant     IMIPENEM 1 SENSITIVE Sensitive     NITROFURANTOIN 128 RESISTANT Resistant     TRIMETH/SULFA <=20 SENSITIVE Sensitive     AMPICILLIN/SULBACTAM >=32 RESISTANT Resistant     PIP/TAZO <=4 SENSITIVE Sensitive     * >=100,000 COLONIES/mL PROVIDENCIA STUARTII    Labs: CBC: Recent Labs  Lab 04/01/23 0048 04/02/23 0709  WBC 12.0* 11.3*  NEUTROABS 7.4  --   HGB 11.6* 10.6*  HCT 36.7* 32.3*  MCV  95.6 93.1  PLT 302 275   Basic Metabolic Panel: Recent Labs  Lab 04/01/23 0048 04/02/23 0709 04/02/23 1524 04/03/23 0411 04/04/23 0126  NA 135 141 140 140 139  K 3.8 2.7* 3.2* 3.2* 3.1*  CL 106 107 109 109 107  CO2 17* 22 21* 18* 21*  GLUCOSE 149* 126* 192* 115* 143*  BUN 25* 23 21 20 17   CREATININE 1.91* 1.81* 1.80* 1.70* 1.73*  CALCIUM 8.7* 8.5* 8.2* 8.2* 8.2*  MG  --  1.2*  --  1.3* 1.4*   Liver Function Tests: Recent Labs  Lab 04/01/23 0048  AST 25  ALT 20  ALKPHOS 101  BILITOT 0.8  PROT 7.1  ALBUMIN 3.2*   CBG: No results for input(s): "GLUCAP" in the last 168 hours.  Discharge time spent: less than 30 minutes.  Signed: Hollice Espy, MD [facility] 04/04/2023

## 2023-04-04 NOTE — Progress Notes (Signed)
Pt was not able to void after 2hrs of foley cath removal. MD was notified. Bladder scan, . MD was notified and ordered Xray of the abdomen. Pt eventually voided after pt was moved side to side by [employer]. MD was informed but pt will be kept overnight due to Xray's result. PTAR was cancelled by ED casemanagement.

## 2023-04-04 NOTE — Care Management (Signed)
Nursing called to state that discharge is being cancelled. Called PTAR and cancelled transport.

## 2023-04-04 NOTE — Progress Notes (Addendum)
Pt has DC order. Pt still has foley cath, MD was informed. MD ordered to remove the foley and DC the pt after voiding. AVS was given and explained to pt. RN was notified that case management is arranging the transport. RN called the son to make them aware of the pt's DC. Education on med was given.

## 2023-04-05 DIAGNOSIS — K6289 Other specified diseases of anus and rectum: Secondary | ICD-10-CM

## 2023-04-05 DIAGNOSIS — R140 Abdominal distension (gaseous): Secondary | ICD-10-CM

## 2023-04-05 LAB — COMPREHENSIVE METABOLIC PANEL
ALT: 14 U/L (ref 0–44)
AST: 19 U/L (ref 15–41)
Albumin: 2.7 g/dL — ABNORMAL LOW (ref 3.5–5.0)
Alkaline Phosphatase: 89 U/L (ref 38–126)
Anion gap: 11 (ref 5–15)
BUN: 24 mg/dL — ABNORMAL HIGH (ref 8–23)
CO2: 22 mmol/L (ref 22–32)
Calcium: 8.3 mg/dL — ABNORMAL LOW (ref 8.9–10.3)
Chloride: 102 mmol/L (ref 98–111)
Creatinine, Ser: 2.07 mg/dL — ABNORMAL HIGH (ref 0.61–1.24)
GFR, Estimated: 34 mL/min — ABNORMAL LOW (ref >60)
Glucose, Bld: 217 mg/dL — ABNORMAL HIGH (ref 70–99)
Potassium: 3.7 mmol/L (ref 3.5–5.1)
Sodium: 135 mmol/L (ref 135–145)
Total Bilirubin: 0.4 mg/dL (ref 0.3–1.2)
Total Protein: 6.3 g/dL — ABNORMAL LOW (ref 6.5–8.1)

## 2023-04-05 LAB — MAGNESIUM: Magnesium: 1.9 mg/dL (ref 1.7–2.4)

## 2023-04-05 NOTE — Progress Notes (Signed)
[facility] Progress Note  Patient: Terry Curenton Sr.    VWU:[id number]  DOA: 03/31/2023    Date of Service: the patient was seen and examined on 04/05/2023  Brief hospital course: Patient is a 67 year old male with past medical history of remote spinal cord injury with paraplegia, stage IIIa CKD, hypertension, CVA and PVD status post multiple toe amputations who presented to the emergency room on the night of 4/23 with abdominal distention times several days.  Patient is unsure of when his last bowel movement was, but possibly the day before or 2.  CT scan of abdomen pelvis noted multiple distended loops throughout his colon without evidence of obstruction.  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  On morning of 4/25, patient underwent colonoscopy by gastroenterology with no findings in rectum to account for CT findings.  As much air as possible was removed from colon and patient's diet advanced.  Patient was felt to be stable and discharge was planned for 4/27.  Prior to discharge that day however, nursing noted patient's abdomen had become firm and distended.  Abdominal x-ray done noting colonic distention with gaseous dilatation of sigmoid colon had returned.  Discharge canceled and patient started on daily Dulcolax suppositories plus Reglan.   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  No etiology for cause noted.  Tolerating advancement of diet.  As per GI recommendations, have now started Reglan and Dulcolax suppositories.  Will increase Reglan and also add rectal tube.  If no improvement by tomorrow, will repeat x-ray and discussed with GI.  Essential hypertension: Blood pressure elevated, restarting home medications.  As needed hydralazine  Stage IIIb CKD: At baseline  Hypothyroidism: Continue Synthroid  History of spinal cord injury paraplegia: At baseline  Colonic polyp:  Noted during flexible sigmoidoscopy.  Patient needs to have outpatient colonoscopy for follow-up and removal.  Discussed with family  Hypomagnesemia and hypokalemia: Replacing as needed, recheck labs in the morning  Hyperlipidemia: Resume statin  Proctitis: Noted on CT on admission.  Patient has been on IV Rocephin, 5 days to date.  Urine cultures positive for Providencia sensitive to Rocephin and Cipro.  Obesity: Meets criteria BMI greater than 30  Body mass index is 31.19 kg/m.  Nutrition Problem: Inadequate oral intake Etiology: poor appetite, altered GI function    Consultants: Gastroenterology  Procedures: Flexible sigmoidoscopy  Antimicrobials: None none  Code Status: Full code   Subjective: Patient feeling okay, no complaint  Objective: Elevated blood pressures Vitals:   04/05/23 0419 04/05/23 1103  BP: (!) 145/78 (!) 151/71  Pulse: 71 66  Resp: 14 16  Temp: 99.2 F (37.3 C) 97.9 F (36.6 C)  SpO2: 100% 100%    Intake/Output Summary (Last 24 hours) at 04/05/2023 1134 Last data filed at 04/05/2023 1000 Gross per 24 hour  Intake 1070.63 ml  Output 1700 ml  Net -629.37 ml    Filed Weights   03/31/23 2353  Weight: 104.3 kg   Body mass index is 31.19 kg/m.  Exam:  General: Alert and oriented x 2-3, no acute distress HEENT: Normocephalic, medical mucous membranes are moist Cardiovascular: Regular rate and rhythm, S1-S2 Respiratory: Clear to auscultation bilaterally Abdomen: Abdomen somewhat firm, significantly distended, nontender, hypoactive bowel sounds Musculoskeletal: No clubbing or cyanosis, trace pitting edema Psychiatry: Appropriate, no evidence of psychosis  Data Reviewed: Labs pending  Disposition:  Status is: Inpatient Remains inpatient appropriate because:  -Recurrence of abdominal symptoms  Anticipated discharge date: 4/29  Family Communication: Updated son by phone DVT Prophylaxis: enoxaparin (LOVENOX) injection 40 mg  Start: 04/02/23 2200    Author: Hollice Espy ,MD 04/05/2023 11:34 AM  To reach On-call, see care teams to locate the attending and reach out via [email or web address]. Between 7PM-7AM, please contact night-coverage If you still have difficulty reaching the attending provider, please page the [facility] (Director on Call) for [facility] on amion for assistance.

## 2023-04-06 ENCOUNTER — Inpatient Hospital Stay (HOSPITAL_COMMUNITY): Payer: Medicare HMO

## 2023-04-06 ENCOUNTER — Encounter (HOSPITAL_COMMUNITY): Payer: Self-pay | Admitting: Gastroenterology

## 2023-04-06 LAB — BASIC METABOLIC PANEL
Anion gap: 9 (ref 5–15)
BUN: 29 mg/dL — ABNORMAL HIGH (ref 8–23)
CO2: 23 mmol/L (ref 22–32)
Calcium: 8.2 mg/dL — ABNORMAL LOW (ref 8.9–10.3)
Chloride: 102 mmol/L (ref 98–111)
Creatinine, Ser: 2.4 mg/dL — ABNORMAL HIGH (ref 0.61–1.24)
GFR, Estimated: 28 mL/min — ABNORMAL LOW (ref >60)
Glucose, Bld: 206 mg/dL — ABNORMAL HIGH (ref 70–99)
Potassium: 3.4 mmol/L — ABNORMAL LOW (ref 3.5–5.1)
Sodium: 134 mmol/L — ABNORMAL LOW (ref 135–145)

## 2023-04-06 LAB — MAGNESIUM: Magnesium: 1.8 mg/dL (ref 1.7–2.4)

## 2023-04-06 MED ORDER — SODIUM CHLORIDE 0.9 % IV SOLN: INTRAVENOUS | Status: DC

## 2023-04-06 MED ORDER — HYDRALAZINE HCL 25 MG PO TABS
25.0000 mg | ORAL_TABLET | Freq: Three times a day (TID) | ORAL | Status: DC
  Administered 2023-04-06 – 2023-04-11 (×15): 25 mg via ORAL
  Filled 2023-04-06 (×15): qty 1

## 2023-04-06 MED ORDER — METOCLOPRAMIDE HCL 5 MG PO TABS
10.0000 mg | ORAL_TABLET | Freq: Three times a day (TID) | ORAL | Status: DC
  Administered 2023-04-06 – 2023-04-11 (×18): 10 mg via ORAL
  Filled 2023-04-06 (×19): qty 2

## 2023-04-06 NOTE — Progress Notes (Signed)
Physical Therapy Treatment Patient Details Name: Terry Slabach Sr. MRN: [id number] DOB: Nov 10, 1956 Today's Date: 04/06/2023   History of Present Illness 67 y.o. male presents to [facility] hospital on 03/31/2023 with abdominal distention. CT abdomen shows multiple distended loops without obstruction. PMH includes ICH, DM, CKD, hypothyroidism, HTN, HLD.    PT Comments    Pt received in supine and agreeable to transfer to recliner. Pt able to perform bed level exercises within available ROM for improved ROM and strengthening and was encouraged to perform independently. Pt requiring max A +2 to roll for maximove sling placement. Pt reporting relief to sit in recliner due to being used to being up in power chair during the day at home. Pt continues to benefit from PT services to progress toward functional mobility goals.     Recommendations for follow up therapy are one component of a multi-disciplinary discharge planning process, led by the attending physician.  Recommendations may be updated based on patient status, additional functional criteria and insurance authorization.     Assistance Recommended at Discharge Frequent or constant Supervision/Assistance  Patient can return home with the following A lot of help with bathing/dressing/bathroom;Assistance with cooking/housework;Assistance with feeding;Assist for transportation;Help with stairs or ramp for entrance;Direct supervision/assist for medications management   Equipment Recommendations  None recommended by PT    Recommendations for Other Services       Precautions / Restrictions Precautions Precautions: Fall Precaution Comments: hemiplegia on R with devloped chronic weakness and ROM deficits on left side Restrictions Weight Bearing Restrictions: No     Mobility  Bed Mobility Overal bed mobility: Needs Assistance Bed Mobility: Rolling Rolling: Max assist, +2 for physical assistance         General bed mobility comments: Pt rolling  L/R with max A +2 to place maximove pad    Transfers Overall transfer level: Needs assistance Equipment used: Ambulation equipment used Transfers: Bed to chair/wheelchair/BSC               Transfer via Lift Equipment: Maximove      Balance Overall balance assessment: Needs assistance Sitting-balance support: Feet unsupported Sitting balance-Leahy Scale: Poor Sitting balance - Comments: sitting in recliner                                    Cognition Arousal/Alertness: Awake/alert Behavior During Therapy: WFL for tasks assessed/performed Overall Cognitive Status: Within Functional Limits for tasks assessed                                          Exercises General Exercises - Upper Extremity Shoulder Flexion: AROM, AAROM, Left, 10 reps, Supine Digit Composite Flexion: AROM, Left, Supine, 5 reps, Other (comment) (with towel) General Exercises - Lower Extremity Heel Slides: AAROM, Supine, Both, 5 reps    General Comments General comments (skin integrity, edema, etc.): VSS      Pertinent Vitals/Pain Pain Assessment Pain Assessment: No/denies pain     PT Goals (current goals can now be found in the care plan section) Acute Rehab PT Goals Patient Stated Goal: to go home PT Goal Formulation: With patient Time For Goal Achievement: 04/15/23 Potential to Achieve Goals: Good Additional Goals Additional Goal #1: Pt will tolerate hoyer lift from bed to recliner or wheelchair, and sit up for at least one hour, to demonstrate tolerance  for mobility in the home setting Progress towards PT goals: Progressing toward goals    Frequency    Min 1X/week      PT Plan Current plan remains appropriate       AM-PAC PT "6 Clicks" Mobility   Outcome Measure  Help needed turning from your back to your side while in a flat bed without using bedrails?: Total Help needed moving from lying on your back to sitting on the side of a flat bed  without using bedrails?: Total Help needed moving to and from a bed to a chair (including a wheelchair)?: Total Help needed standing up from a chair using your arms (e.g., wheelchair or bedside chair)?: Total Help needed to walk in hospital room?: Total Help needed climbing 3-5 steps with a railing? : Total 6 Click Score: 6    End of Session   Activity Tolerance: Patient tolerated treatment well Patient left: in chair;with chair alarm set;with call bell/phone within reach Nurse Communication: Mobility status;Need for lift equipment PT Visit Diagnosis: Other symptoms and signs involving the nervous system (Z61.096)     Time: 0454-0981 PT Time Calculation (min) (ACUTE ONLY): 36 min  Charges:  $Therapeutic Exercise: 8-22 mins $Therapeutic Activity: 8-22 mins                     Johny Shock, PTA Acute Rehabilitation Services Secure Chat Preferred  [phone]    Johny Shock 04/06/2023, 10:06 AM

## 2023-04-06 NOTE — Progress Notes (Signed)
[facility] Progress Note  Patient: Terry Senna Sr.    ZOX:[id number]  DOA: 03/31/2023    Date of Service: the patient was seen and examined on 04/06/2023  Brief hospital course: Patient is a 67 year old male with past medical history of remote spinal cord injury with paraplegia, stage IIIa CKD, hypertension, CVA and PVD status post multiple toe amputations who presented to the emergency room on the night of 4/23 with abdominal distention times several days.  Patient is unsure of when his last bowel movement was, but possibly the day before or 2.  CT scan of abdomen pelvis noted multiple distended loops throughout his colon without evidence of obstruction.  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  On morning of 4/25, patient underwent colonoscopy by gastroenterology with no findings in rectum to account for CT findings.  As much air as possible was removed from colon and patient's diet advanced.  Patient was felt to be stable and discharge was planned for 4/27.  Prior to discharge that day however, nursing noted patient's abdomen had become firm and distended.  Abdominal x-ray done noting colonic distention with gaseous dilatation of sigmoid colon had returned.  Discharge canceled and patient started on daily Dulcolax suppositories plus Reglan.   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  No etiology for cause noted.  Tolerating advancement of diet.  As per GI recommendations, have now started Reglan and Dulcolax suppositories and placement of rectal tube.  Not much improvement in distention by exam.  Will increase Reglan and recheck abdominal x-ray in the morning.  Also, nursing notified me that tube was not yet placed, so placing now  Essential hypertension: Blood pressure elevated, restarting home medications.  Have also added hydralazine  Stage IIIb CKD: At  baseline  Hypothyroidism: Continue Synthroid  History of spinal cord injury paraplegia: At baseline  Colonic polyp: Noted during flexible sigmoidoscopy.  Patient needs to have outpatient colonoscopy for follow-up and removal.  Discussed with family  Hypomagnesemia and hypokalemia: Replacing as needed, better today.  Recheck labs in the morning.  Hyperlipidemia: Resume statin  Proctitis: Noted on CT on admission.  Patient has been on IV Rocephin, 6 days to date.  Urine cultures positive for Providencia sensitive to Rocephin and Cipro.  Obesity: Meets criteria BMI greater than 30  Body mass index is 31.19 kg/m.  Nutrition Problem: Inadequate oral intake Etiology: poor appetite, altered GI function    Consultants: Gastroenterology  Procedures: Flexible sigmoidoscopy  Antimicrobials: None none  Code Status: Full code   Subjective: Patient feeling okay, no complaint, tolerating p.o., no abdominal pain  Objective: Elevated blood pressures Vitals:   04/05/23 2106 04/06/23 0521  BP: (!) 171/83 (!) 156/79  Pulse: 69 70  Resp: 20 18  Temp: 98.4 F (36.9 C) 98.5 F (36.9 C)  SpO2: 99% 100%    Intake/Output Summary (Last 24 hours) at 04/06/2023 1303 Last data filed at 04/06/2023 8119 Gross per 24 hour  Intake 240 ml  Output 1001 ml  Net -761 ml    Filed Weights   03/31/23 2353  Weight: 104.3 kg   Body mass index is 31.19 kg/m.  Exam:  General: Alert and oriented x 2-3, no acute distress HEENT: Normocephalic, medical mucous membranes are moist Cardiovascular: Regular rate and rhythm, S1-S2 Respiratory: Clear to auscultation bilaterally Abdomen: Abdomen remains firm, significantly distended, nontender, hypoactive bowel sounds Musculoskeletal: No clubbing or cyanosis, trace pitting edema Psychiatry: Appropriate, no evidence  of psychosis  Data Reviewed: Creatinine of 2.4.  Disposition:  Status is: Inpatient Remains inpatient appropriate because:   -Recurrence of abdominal symptoms    Anticipated discharge date: 4/30  Family Communication: Updated son by phone DVT Prophylaxis: enoxaparin (LOVENOX) injection 40 mg Start: 04/02/23 2200    Author: Hollice Espy ,MD 04/06/2023 1:03 PM  To reach On-call, see care teams to locate the attending and reach out via [email or web address]. Between 7PM-7AM, please contact night-coverage If you still have difficulty reaching the attending provider, please page the [facility] (Director on Call) for [facility] on amion for assistance.

## 2023-04-06 NOTE — Care Management Important Message (Signed)
Important Message  Patient Details  Name: Terry Meadowcroft Sr. MRN: [id number] Date of Birth: [birth year]   Medicare Important Message Given:  Yes     Sherilyn Banker 04/06/2023, 4:30 PM

## 2023-04-07 ENCOUNTER — Encounter (HOSPITAL_COMMUNITY): Payer: Self-pay | Admitting: Internal Medicine

## 2023-04-07 ENCOUNTER — Inpatient Hospital Stay (HOSPITAL_COMMUNITY): Payer: Medicare HMO | Admitting: Certified Registered Nurse Anesthetist

## 2023-04-07 ENCOUNTER — Inpatient Hospital Stay (HOSPITAL_COMMUNITY): Payer: Medicare HMO

## 2023-04-07 ENCOUNTER — Encounter (HOSPITAL_COMMUNITY)
Admission: EM | Disposition: A | Discharge status: Home / Self Care | Payer: Self-pay | Source: Home / Self Care | Attending: Internal Medicine

## 2023-04-07 DIAGNOSIS — K5939 Other megacolon: Secondary | ICD-10-CM | POA: Diagnosis not present

## 2023-04-07 DIAGNOSIS — E119 Type 2 diabetes mellitus without complications: Secondary | ICD-10-CM

## 2023-04-07 DIAGNOSIS — I10 Essential (primary) hypertension: Secondary | ICD-10-CM | POA: Diagnosis not present

## 2023-04-07 DIAGNOSIS — E039 Hypothyroidism, unspecified: Secondary | ICD-10-CM

## 2023-04-07 HISTORY — PX: BOWEL DECOMPRESSION: SHX5532

## 2023-04-07 HISTORY — PX: FLEXIBLE SIGMOIDOSCOPY: SHX5431

## 2023-04-07 LAB — BASIC METABOLIC PANEL
Anion gap: 11 (ref 5–15)
BUN: 40 mg/dL — ABNORMAL HIGH (ref 8–23)
CO2: 21 mmol/L — ABNORMAL LOW (ref 22–32)
Calcium: 8.6 mg/dL — ABNORMAL LOW (ref 8.9–10.3)
Chloride: 103 mmol/L (ref 98–111)
Creatinine, Ser: 2.34 mg/dL — ABNORMAL HIGH (ref 0.61–1.24)
GFR, Estimated: 29 mL/min — ABNORMAL LOW (ref >60)
Glucose, Bld: 216 mg/dL — ABNORMAL HIGH (ref 70–99)
Potassium: 3.7 mmol/L (ref 3.5–5.1)
Sodium: 135 mmol/L (ref 135–145)

## 2023-04-07 SURGERY — SIGMOIDOSCOPY, FLEXIBLE
Anesthesia: General

## 2023-04-07 MED ORDER — SODIUM CHLORIDE 0.9 % IV SOLN: INTRAVENOUS | Status: DC | PRN

## 2023-04-07 MED ORDER — POLYETHYLENE GLYCOL 3350 17 G PO PACK
17.0000 g | PACK | Freq: Every day | ORAL | Status: DC
  Administered 2023-04-07 – 2023-04-08 (×2): 17 g via ORAL
  Filled 2023-04-07 (×2): qty 1

## 2023-04-07 MED ORDER — PROPOFOL 10 MG/ML IV BOLUS
INTRAVENOUS | Status: DC | PRN
  Administered 2023-04-07: 120 mg via INTRAVENOUS

## 2023-04-07 MED ORDER — ROCURONIUM BROMIDE 10 MG/ML (PF) SYRINGE
PREFILLED_SYRINGE | INTRAVENOUS | Status: DC | PRN
  Administered 2023-04-07: 70 mg via INTRAVENOUS

## 2023-04-07 MED ORDER — LIDOCAINE 2% (20 MG/ML) 5 ML SYRINGE
INTRAMUSCULAR | Status: DC | PRN
  Administered 2023-04-07: 80 mg via INTRAVENOUS

## 2023-04-07 MED ORDER — ONDANSETRON HCL 4 MG/2ML IJ SOLN
INTRAMUSCULAR | Status: DC | PRN
  Administered 2023-04-07: 4 mg via INTRAVENOUS

## 2023-04-07 MED ORDER — PHENYLEPHRINE 80 MCG/ML (10ML) SYRINGE FOR IV PUSH (FOR BLOOD PRESSURE SUPPORT)
PREFILLED_SYRINGE | INTRAVENOUS | Status: DC | PRN
  Administered 2023-04-07 (×4): 80 ug via INTRAVENOUS

## 2023-04-07 MED ORDER — SUGAMMADEX SODIUM 200 MG/2ML IV SOLN
INTRAVENOUS | Status: DC | PRN
  Administered 2023-04-07: 400 mg via INTRAVENOUS

## 2023-04-07 NOTE — Progress Notes (Addendum)
[facility] Progress Note  Patient: Terry Ogle Sr.    ZDG:[id number]  DOA: 03/31/2023    Date of Service: the patient was seen and examined on 04/07/2023  Brief hospital course: Patient is a 67 year old male with past medical history of remote spinal cord injury with paraplegia, stage IIIa CKD, hypertension, CVA and PVD status post multiple toe amputations who presented to the emergency room on the night of 4/23 with abdominal distention times several days.  Patient is unsure of when his last bowel movement was, but possibly the day before or 2.  CT scan of abdomen pelvis noted multiple distended loops throughout his colon without evidence of obstruction.  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  On morning of 4/25, patient underwent colonoscopy by gastroenterology with no findings in rectum to account for CT findings.  As much air as possible was removed from colon and patient's diet advanced.  Patient was felt to be stable and discharge was planned for 4/27.  Prior to discharge that day however, nursing noted patient's abdomen had become firm and distended.  Abdominal x-ray done noting colonic distention with gaseous dilatation of sigmoid colon had returned.  Discharge canceled and patient started on daily Dulcolax suppositories plus Reglan  and rectal tube.  Despite this, abdominal x-ray worse on 4/30.  GI re-seeing.   patient also with incidentally noted proctitis seen on CT scan on admission.  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  No etiology for cause noted.  Tolerating advancement of diet.  As per GI recommendations, have now started Reglan and Dulcolax suppositories and placement of rectal tube.  Not much improvement in distention by exam.  Reglan increased.  Despite this, x-ray notes increased distention 4/30.  GI  re-seeing.  Essential hypertension: Blood pressure elevated, restarting home medications.  Have also added hydralazine, increase accordingly  Stage IIIb CKD: At baseline  Hypothyroidism: Continue Synthroid  History of spinal cord injury paraplegia: At baseline  Colonic polyp: Noted during flexible sigmoidoscopy.  Patient needs to have outpatient colonoscopy for follow-up and removal.  Discussed with family  Hypomagnesemia and hypokalemia: Replacing as needed, better today.  Recheck labs in the morning.  Hyperlipidemia: Resume statin  Proctitis: Noted on CT on admission.  Completes 7 days of IV Rocephin 5/1.  Urine cultures positive for Providencia sensitive to Rocephin and Cipro.  Obesity: Meets criteria BMI greater than 30  Body mass index is 31.19 kg/m.  Nutrition Problem: Inadequate oral intake Etiology: poor appetite, altered GI function    Consultants: Gastroenterology  Procedures: Flexible sigmoidoscopy  Antimicrobials: IV Rocephin 4/24 - 5/1  Code Status: Full code   Subjective: Patient tolerating p.o., denies any abdominal pain.  Objective: Elevated blood pressures Vitals:   04/07/23 0502 04/07/23 0748  BP: (!) 174/63 (!) 149/65  Pulse: 68 72  Resp: 18 17  Temp: 98 F (36.7 C) (!) 97.5 F (36.4 C)  SpO2: 100% 100%    Intake/Output Summary (Last 24 hours) at 04/07/2023 1119 Last data filed at 04/07/2023 0518 Gross per 24 hour  Intake --  Output 1450 ml  Net -1450 ml    Filed Weights   03/31/23 2353  Weight: 104.3 kg   Body mass index is 31.19 kg/m.  Exam:  General: Alert and oriented x 2-3, no acute distress HEENT: Normocephalic, medical mucous membranes are moist Cardiovascular: Regular rate and rhythm, S1-S2 Respiratory:  Clear to auscultation bilaterally Abdomen: Abdomen continues to remain firm, significantly distended, nontender, hypoactive bowel sounds Musculoskeletal: No clubbing or cyanosis, trace pitting edema Psychiatry:  Appropriate, no evidence of psychosis  Data Reviewed: Creatinine down slightly to 2.3.  Disposition:  Status is: Inpatient Remains inpatient appropriate because:  -Recurrence of abdominal symptoms    Anticipated discharge date: 5/2  Family Communication: Updated son by phone DVT Prophylaxis: enoxaparin (LOVENOX) injection 40 mg Start: 04/02/23 2200    Author: Hollice Espy ,MD 04/07/2023 11:19 AM  To reach On-call, see care teams to locate the attending and reach out via [email or web address]. Between 7PM-7AM, please contact night-coverage If you still have difficulty reaching the attending provider, please page the [facility] (Director on Call) for [facility] on amion for assistance.

## 2023-04-07 NOTE — Anesthesia Postprocedure Evaluation (Signed)
Anesthesia Post Note  Patient: [name] Sr.  Procedure(s) Performed: FLEXIBLE SIGMOIDOSCOPY BOWEL DECOMPRESSION     Patient location during evaluation: Endoscopy Anesthesia Type: General Level of consciousness: awake and alert Pain management: pain level controlled Vital Signs Assessment: post-procedure vital signs reviewed and stable Respiratory status: spontaneous breathing, nonlabored ventilation, respiratory function stable and patient connected to nasal cannula oxygen Cardiovascular status: blood pressure returned to baseline and stable Postop Assessment: no apparent nausea or vomiting Anesthetic complications: no   No notable events documented.  Last Vitals:  Vitals:   04/07/23 1340 04/07/23 1522  BP: (!) 181/50 (!) 168/66  Pulse: 72   Resp: 16   Temp: 36.5 C   SpO2: 97%     Last Pain:  Vitals:   04/07/23 1540  TempSrc:   PainSc: 0-No pain                 Collene Schlichter

## 2023-04-07 NOTE — Progress Notes (Signed)
Pt had fecal tube but defecated it out with BM. Pt went to OR for flexible sigmoidoscopy with decompression tube placement. Below is the conversation be this RN and the MD in regards to replace fecal tube:  This pt had a fecal tube before coming up, would you like that replaced? Thanks 3:58 PM VC Vito Cirigliano V, DO Do you mean the rectal decompression tube that I just placed endoscopically? That stays in. If it falls out inadvertently, then ok to replace rectal tube on floor 3:59 PM Your tube is still in, a rectal tube for stool. I will hold placing rectal tube while your tube is in place. Thanks

## 2023-04-07 NOTE — Anesthesia Preprocedure Evaluation (Signed)
Anesthesia Evaluation  Patient identified by MRN, date of birth, ID band Patient awake    Reviewed: Allergy & Precautions, NPO status , Patient's Chart, lab work & pertinent test results  Airway Mallampati: II  TM Distance: >3 FB Neck ROM: Full    Dental  (+) Teeth Intact, Dental Advisory Given   Pulmonary neg pulmonary ROS   Pulmonary exam normal breath sounds clear to auscultation       Cardiovascular hypertension, Pt. on medications Normal cardiovascular exam Rhythm:Regular Rate:Normal     Neuro/Psych Paraplegia  CVA, Residual Symptoms    GI/Hepatic Neg liver ROS,,,ogilvie's syndrome- colonic distention   Endo/Other  diabetes, Type 2Hypothyroidism    Renal/GU Renal InsufficiencyRenal disease     Musculoskeletal negative musculoskeletal ROS (+)    Abdominal   Peds  Hematology negative hematology ROS (+)   Anesthesia Other Findings Day of surgery medications reviewed with the patient.  Reproductive/Obstetrics                             Anesthesia Physical Anesthesia Plan  ASA: 3 and emergent  Anesthesia Plan: General   Post-op Pain Management:    Induction: Intravenous, Rapid sequence and Cricoid pressure planned  PONV Risk Score and Plan: 2 and Dexamethasone and Ondansetron  Airway Management Planned: Oral ETT  Additional Equipment:   Intra-op Plan:   Post-operative Plan: Extubation in OR  Informed Consent: I have reviewed the patients History and Physical, chart, labs and discussed the procedure including the risks, benefits and alternatives for the proposed anesthesia with the patient or authorized representative who has indicated his/her understanding and acceptance.     Dental advisory given  Plan Discussed with: CRNA  Anesthesia Plan Comments:        Anesthesia Quick Evaluation

## 2023-04-07 NOTE — Transfer of Care (Signed)
Immediate Anesthesia Transfer of Care Note  Patient: Terry Revere Sr.  Procedure(s) Performed: FLEXIBLE SIGMOIDOSCOPY  Patient Location: Endoscopy Unit  Anesthesia Type:General  Level of Consciousness: awake and alert   Airway & Oxygen Therapy: Patient Spontanous Breathing  Post-op Assessment: Report given to RN and Post -op Vital signs reviewed and stable  Post vital signs: Reviewed and stable  Last Vitals:  Vitals Value Taken Time  BP    Temp    Pulse    Resp    SpO2      Last Pain:  Vitals:   04/07/23 1340  TempSrc: Temporal  PainSc: 0-No pain         Complications: No notable events documented.

## 2023-04-07 NOTE — Anesthesia Procedure Notes (Signed)
Procedure Name: Intubation Date/Time: 04/07/2023 2:25 PM  Performed by: Alease Medina, CRNAPre-anesthesia Checklist: Patient identified, Emergency Drugs available, Suction available and Patient being monitored Patient Re-evaluated:Patient Re-evaluated prior to induction Oxygen Delivery Method: Circle System Utilized Preoxygenation: Pre-oxygenation with 100% oxygen Induction Type: IV induction, Rapid sequence and Cricoid Pressure applied Laryngoscope Size: Mac and 4 Grade View: Grade I Tube type: Oral Number of attempts: 1 Airway Equipment and Method: Stylet Placement Confirmation: ETT inserted through vocal cords under direct vision, positive ETCO2 and breath sounds checked- equal and bilateral Secured at: 22 cm Tube secured with: Tape Dental Injury: Teeth and Oropharynx as per pre-operative assessment

## 2023-04-07 NOTE — Progress Notes (Addendum)
Attending physician's note   I have taken a history, reviewed the chart, and examined the patient. I performed a substantive portion of this encounter, including complete performance of at least one of the key components, in conjunction with the APP. I agree with the APP's note, impression, and recommendations with my edits.   Has had currents of abdominal distention with severe dilation of the colon noted on serial imaging.  X-ray yesterday was 17.1 cm sigmoid colon, and repeat imaging today is similar to 16.1 cm.  Decreased stool output and increasing abdominal pain.  Based on clinical presentation and imaging studies, plan for emergent flexible sigmoidoscopy today for colonic decompression.  Discussed with the Anesthesia service, with plan for elective intubation for emergent procedure today.  [address] DO, FACG [phone] office          Progress Note   Subjective   Day # 7  CC;Ogilvie's syndrome   Plan the hospitalist to reach the patient today for recurrence of abdominal distention Patient was seen last week by our service and underwent flexible sigmoidoscopy on 04/02/2023 after abnormal CT.  Labs showed residual stool and food in the distal rectum, 10 mm polyp in the distal rectum, sessile, the lumen of the entire examined colon was grossly dilated, he was decompressed Symptoms improved after decompression, and had recurrence of abdominal distention and tympany yesterday.  He was given Dulcolax suppository, started on Reglan, and then rectal tube was placed at some point last p.m.  KUB yesterday-showed severe dilation of the sigmoid colon now measuring 17 cm, increased compared to prior exam, mildly dilated air-filled small bowel loops  Labs today-calcium 3.7/BUN 40/creatinine 2.34 Magnesium 1.8 yesterday  Patient denies abdominal pain, is aware that his abdomen is distended but says it is not hurting, he ate solid food for breakfast.  Urinary catheter in  place Rectal tube in place with about 200 cc of liquid stool    Objective   Vital signs in last 24 hours: Temp:  [97.5 F (36.4 C)-98.1 F (36.7 C)] 97.5 F (36.4 C) (04/30 0748) Pulse Rate:  [64-72] 72 (04/30 0748) Resp:  [17-19] 17 (04/30 0748) BP: (149-174)/(63-74) 149/65 (04/30 0748) SpO2:  [98 %-100 %] 100 % (04/30 0748) Last BM Date : 04/06/23 General:   67 African-American male in NAD Heart:  Regular rate and rhythm; no murmurs Lungs: Respirations even and unlabored, lungs CTA bilaterally Abdomen: Distended, fairly tight and tympanitic, bowel sounds are present, no significant tenderness  Extremities:  Without edema. Neurologic:  Alert and oriented, paraplegia Psych:  Cooperative. Normal mood and affect.  Intake/Output from previous day: 04/29 0701 - 04/30 0700 In: -  Out: 1450 [Urine:1450] Intake/Output this shift: No intake/output data recorded.  Lab Results: No results for input(s): "WBC", "HGB", "HCT", "PLT" in the last 72 hours. BMET Recent Labs    04/05/23 1216 04/06/23 0043 04/07/23 0354  NA 135 134* 135  K 3.7 3.4* 3.7  CL 102 102 103  CO2 22 23 21*  GLUCOSE 217* 206* 216*  BUN 24* 29* 40*  CREATININE 2.07* 2.40* 2.34*  CALCIUM 8.3* 8.2* 8.6*   LFT Recent Labs    04/05/23 1216  PROT 6.3*  ALBUMIN 2.7*  AST 19  ALT 14  ALKPHOS 89  BILITOT 0.4   PT/INR No results for input(s): "LABPROT", "INR" in the last 72 hours.  Studies/Results: DG Abd Portable 1V  Result Date: 04/06/2023 CLINICAL DATA:  Paralytic ileus of small intestine and colon.  EXAM: PORTABLE ABDOMEN - 1 VIEW COMPARISON:  April 04, 2023. FINDINGS: There is again noted severe dilatation of sigmoid colon, now measuring 17 cm. Mildly dilated air-filled small bowel loops are noted which may represent ileus or possibly obstruction. IMPRESSION: Severe dilatation of sigmoid colon is again noted, now measuring 17 cm which is increased compared to prior exam. Electronically Signed   By:  Lupita Raider M.D.   On: 04/06/2023 15:35       Assessment / Plan:    #25 67 year old African-American male, with wheelchair bound at baseline with history of prior CVA and spinal cord injury with paraplegia, chronic kidney disease stage III, diabetes mellitus, hypertension, peripheral vascular disease, and hypothyroidism who was admitted on 04/01/2023 after presenting to the emergency room with abdominal distention which has been progressive over the few days prior to admission.  CT imaging showed significantly distended colon and mild rectal wall thickening, small to moderate right pleural effusion Sigmoid colon was measured at 12.3 cm, transverse 7.9 cm and descending colon 6.1 cm.  He was also diagnosed with a UTI and noted to have a distended bladder  He underwent flexible sigmoidoscopy for what was felt to be an Ogilvie's type picture on 04/02/2023 with decompression.  He had significant improvement in symptoms post decompression and gradual recurrence over the past 2 days  KUB yesterday showed a grossly severely distended sigmoid colon now measuring 17 cm  Rectal tube has been placed and he is having some output of liquid stool  Potassium and magnesium currently normal  Trigger for the Ogilvie's is not clear at this time it does not sound as if he has had chronic problems.  Plan; 2 view abdomen today Keep n.p.o. until abdominal films reviewed-with has not had any improvement since yesterday's films he may need flexible sigmoidoscopy with decompression later this afternoon Will need more aggressive bowel regimen post decompression. Okay to continue Reglan, more effective for her upper gut motility He is apparently on chronic narcotics at home, oxycodone-will need to minimize pain meds GI will follow with you  Addendum; plan to proceed with flexible sigmoidoscopy with colonic decompression this afternoon per Dr. Barron Alvine.         Principal Problem:   Ogilvie's  syndrome Active Problems:   Essential hypertension   Hyperlipidemia   Hypothyroidism   Stage 3b chronic kidney disease (CKD) (HCC)   Abnormality of rectum   Abnormal CT of the abdomen   Abdominal distension     LOS: 6 days   Amy EsterwoodPA-C  04/07/2023, 10:35 AM

## 2023-04-07 NOTE — Op Note (Signed)
[facility] Patient Name: Terry Barton Procedure Date : 04/07/2023 MRN: [id number] Attending MD: Doristine Locks , MD, [id number] Date of Birth: [birth year] CSN: [id number] Age: 67 Admit Type: Inpatient Procedure:                Flexible Sigmoidoscopy with endoscopic rectal                            decompression tube placement Indications:              Abnormal abdominal x-ray of the GI tract, Abdominal                            distension, Ogilvie's Syndrome, Distended colon Providers:                Doristine Locks, MD, Dartha Lodge, RN, Rozetta Nunnery, Technician Referring MD:              Medicines:                Monitored Anesthesia Care Complications:            No immediate complications. Estimated Blood Loss:     Estimated blood loss: none. Procedure:                Pre-Anesthesia Assessment:                           - Prior to the procedure, a History and Physical                            was performed, and patient medications and                            allergies were reviewed. The patient's tolerance of                            previous anesthesia was also reviewed. The risks                            and benefits of the procedure and the sedation                            options and risks were discussed with the patient.                            All questions were answered, and informed consent                            was obtained. Prior Anticoagulants: The patient has                            taken no anticoagulant or antiplatelet agents. ASA  Grade Assessment: III E - A patient with severe                            systemic disease. After reviewing the risks and                            benefits, the patient was deemed in satisfactory                            condition to undergo the procedure.                           After obtaining informed consent, the scope was                             passed under direct vision. The PCF-190TL ([id number])                            Olympus colonoscope was introduced through the anus                            and advanced to the the left transverse colon using                            primarily water insufflation. The flexible                            sigmoidoscopy was accomplished without difficulty.                            The patient tolerated the procedure well. The                            quality of the bowel preparation was adequate. Scope In: Scope Out: Findings:      The perianal and digital rectal examinations were normal.      The lumen of the sigmoid colon and distal descending colon was       significantly dilated. A colonic decompression tube was placed into the       colon using snare. After placement, the colon was fully desufflated       using the colonoscope.      A moderate amount of stool was found in the rectum through the left       transverse colon. Lavage of the area was performed, resulting in       clearance with adequate visualization. Impression:               - Dilated sigmoid colon and distal descending                            colon. Rectal decompression tube was placed, then                            the colon was fully desuflated with the colonoscope.                           -  Stool in the rectum and in the transverse colon.                           - No specimens collected. Recommendation:           - Return patient to hospital ward for ongoing care.                           - Continue present medications.                           ?" Increase MiraLAX to 1 cap daily dosing scheduled,                            and may need to increase to twice daily dosing in                            the short-term. If producing regular stools and no                            recurrence of colonic distention, can then reduce                            to as needed.                            ?" Continue additional aggressive bowel regimen as                            currently prescribed.                           ?" If recurrence, may need to consider trial of                            neostigmine.                           ?" Rectal tube placed during the procedure today.                            This may spontaneously dislodged. If that happens,                            and continued symptoms, can replace with rectal                            tube on the floor.                           ?" Depending on response to therapy, may also                            consider trial of Motegrity (not sure if we can  obtain that while in-house though).                           - Advance diet as tolerated on the floor. Procedure Code(s):        --- Professional ---                           [phone], Sigmoidoscopy, flexible; with decompression                            (for pathologic distention) (eg, volvulus,                            megacolon), including placement of decompression                            tube, when performed Diagnosis Code(s):        --- Professional ---                           K59.39, Other megacolon                           R93.3, Abnormal findings on diagnostic imaging of                            other parts of digestive tract CPT copyright 2022 [employer]. All rights reserved. The codes documented in this report are preliminary and upon coder review may  be revised to meet current compliance requirements. Doristine Locks, MD 04/07/2023 3:15:15 PM Number of Addenda: 0

## 2023-04-07 NOTE — H&P (View-Only) (Signed)
    Attending physician's note   I have taken a history, reviewed the chart, and examined the patient. I performed a substantive portion of this encounter, including complete performance of at least one of the key components, in conjunction with the APP. I agree with the APP's note, impression, and recommendations with my edits.   Has had currents of abdominal distention with severe dilation of the colon noted on serial imaging.  X-ray yesterday was 17.1 cm sigmoid colon, and repeat imaging today is similar to 16.1 cm.  Decreased stool output and increasing abdominal pain.  Based on clinical presentation and imaging studies, plan for emergent flexible sigmoidoscopy today for colonic decompression.  Discussed with the Anesthesia service, with plan for elective intubation for emergent procedure today.  Mirayah Wren, DO, FACG [phone] office          Progress Note   Subjective   Day # 7  CC;Ogilvie's syndrome   Plan the hospitalist to reach the patient today for recurrence of abdominal distention Patient was seen last week by our service and underwent flexible sigmoidoscopy on 04/02/2023 after abnormal CT.  Labs showed residual stool and food in the distal rectum, 10 mm polyp in the distal rectum, sessile, the lumen of the entire examined colon was grossly dilated, he was decompressed Symptoms improved after decompression, and had recurrence of abdominal distention and tympany yesterday.  He was given Dulcolax suppository, started on Reglan, and then rectal tube was placed at some point last p.m.  KUB yesterday-showed severe dilation of the sigmoid colon now measuring 17 cm, increased compared to prior exam, mildly dilated air-filled small bowel loops  Labs today-calcium 3.7/BUN 40/creatinine 2.34 Magnesium 1.8 yesterday  Patient denies abdominal pain, is aware that his abdomen is distended but says it is not hurting, he ate solid food for breakfast.  Urinary catheter in  place Rectal tube in place with about 200 cc of liquid stool    Objective   Vital signs in last 24 hours: Temp:  [97.5 F (36.4 C)-98.1 F (36.7 C)] 97.5 F (36.4 C) (04/30 0748) Pulse Rate:  [64-72] 72 (04/30 0748) Resp:  [17-19] 17 (04/30 0748) BP: (149-174)/(63-74) 149/65 (04/30 0748) SpO2:  [98 %-100 %] 100 % (04/30 0748) Last BM Date : 04/06/23 General:   Older African-American male in NAD Heart:  Regular rate and rhythm; no murmurs Lungs: Respirations even and unlabored, lungs CTA bilaterally Abdomen: Distended, fairly tight and tympanitic, bowel sounds are present, no significant tenderness  Extremities:  Without edema. Neurologic:  Alert and oriented, paraplegia Psych:  Cooperative. Normal mood and affect.  Intake/Output from previous day: 04/29 0701 - 04/30 0700 In: -  Out: 1450 [Urine:1450] Intake/Output this shift: No intake/output data recorded.  Lab Results: No results for input(s): "WBC", "HGB", "HCT", "PLT" in the last 72 hours. BMET Recent Labs    04/05/23 1216 04/06/23 0043 04/07/23 0354  NA 135 134* 135  K 3.7 3.4* 3.7  CL 102 102 103  CO2 22 23 21*  GLUCOSE 217* 206* 216*  BUN 24* 29* 40*  CREATININE 2.07* 2.40* 2.34*  CALCIUM 8.3* 8.2* 8.6*   LFT Recent Labs    04/05/23 1216  PROT 6.3*  ALBUMIN 2.7*  AST 19  ALT 14  ALKPHOS 89  BILITOT 0.4   PT/INR No results for input(s): "LABPROT", "INR" in the last 72 hours.  Studies/Results: DG Abd Portable 1V  Result Date: 04/06/2023 CLINICAL DATA:  Paralytic ileus of small intestine and colon.   EXAM: PORTABLE ABDOMEN - 1 VIEW COMPARISON:  April 04, 2023. FINDINGS: There is again noted severe dilatation of sigmoid colon, now measuring 17 cm. Mildly dilated air-filled small bowel loops are noted which may represent ileus or possibly obstruction. IMPRESSION: Severe dilatation of sigmoid colon is again noted, now measuring 17 cm which is increased compared to prior exam. Electronically Signed   By:  James  Green Jr M.D.   On: 04/06/2023 15:35       Assessment / Plan:    #1 67-year-old African-American male, with wheelchair bound at baseline with history of prior CVA and spinal cord injury with paraplegia, chronic kidney disease stage III, diabetes mellitus, hypertension, peripheral vascular disease, and hypothyroidism who was admitted on 04/01/2023 after presenting to the emergency room with abdominal distention which has been progressive over the few days prior to admission.  CT imaging showed significantly distended colon and mild rectal wall thickening, small to moderate right pleural effusion Sigmoid colon was measured at 12.3 cm, transverse 7.9 cm and descending colon 6.1 cm.  He was also diagnosed with a UTI and noted to have a distended bladder  He underwent flexible sigmoidoscopy for what was felt to be an Ogilvie's type picture on 04/02/2023 with decompression.  He had significant improvement in symptoms post decompression and gradual recurrence over the past 2 days  KUB yesterday showed a grossly severely distended sigmoid colon now measuring 17 cm  Rectal tube has been placed and he is having some output of liquid stool  Potassium and magnesium currently normal  Trigger for the Ogilvie's is not clear at this time it does not sound as if he has had chronic problems.  Plan; 2 view abdomen today Keep n.p.o. until abdominal films reviewed-with has not had any improvement since yesterday's films he may need flexible sigmoidoscopy with decompression later this afternoon Will need more aggressive bowel regimen post decompression. Okay to continue Reglan, more effective for her upper gut motility He is apparently on chronic narcotics at home, oxycodone-will need to minimize pain meds GI will follow with you  Addendum; plan to proceed with flexible sigmoidoscopy with colonic decompression this afternoon per Dr. Julio Storr.         Principal Problem:   Ogilvie's  syndrome Active Problems:   Essential hypertension   Hyperlipidemia   Hypothyroidism   Stage 3b chronic kidney disease (CKD) (HCC)   Abnormality of rectum   Abnormal CT of the abdomen   Abdominal distension     LOS: 6 days   Amy EsterwoodPA-C  04/07/2023, 10:35 AM   

## 2023-04-07 NOTE — Interval H&P Note (Signed)
History and Physical Interval Note:  04/07/2023 1:39 PM  Terry Revere Sr.  has presented today for surgery, with the diagnosis of ogilvie's syndrome- colonic distention.  The various methods of treatment have been discussed with the patient and family. After consideration of risks, benefits and other options for treatment, the patient has consented to  Procedure(s): FLEXIBLE SIGMOIDOSCOPY (N/A) as a surgical intervention.  The patient's history has been reviewed, patient examined, no change in status, stable for surgery.  I have reviewed the patient's chart and labs.  Questions were answered to the patient's satisfaction.     Verlin Dike Linh Hedberg

## 2023-04-08 ENCOUNTER — Inpatient Hospital Stay (HOSPITAL_COMMUNITY): Payer: Medicare HMO

## 2023-04-08 DIAGNOSIS — I129 Hypertensive chronic kidney disease with stage 1 through stage 4 chronic kidney disease, or unspecified chronic kidney disease: Secondary | ICD-10-CM

## 2023-04-08 DIAGNOSIS — E039 Hypothyroidism, unspecified: Secondary | ICD-10-CM

## 2023-04-08 DIAGNOSIS — N183 Chronic kidney disease, stage 3 unspecified: Secondary | ICD-10-CM

## 2023-04-08 DIAGNOSIS — Z794 Long term (current) use of insulin: Secondary | ICD-10-CM

## 2023-04-08 DIAGNOSIS — E1129 Type 2 diabetes mellitus with other diabetic kidney complication: Secondary | ICD-10-CM

## 2023-04-08 MED ORDER — POLYETHYLENE GLYCOL 3350 17 G PO PACK
17.0000 g | PACK | Freq: Two times a day (BID) | ORAL | Status: DC
  Administered 2023-04-08 – 2023-04-11 (×5): 17 g via ORAL
  Filled 2023-04-08 (×6): qty 1

## 2023-04-08 NOTE — Progress Notes (Addendum)
[facility] Progress Note  Patient: Terry Leung Sr.     QMV:[id number]  DOA: 03/31/2023   PCP: Loura Back, NP       Brief hospital course: This is a 67 year old male who is paraplegic, has had a CVA and has peripheral vascular disease, CKD stage III AAA and hypertension who presented to the ED with abdominal distention.  He is suspected to have Ogilvie syndrome. On 4/25 he underwent a flex sig which showed a dilated colon above the anus that was decompressed.  There was a polyp in the distal rectum that was not removed. Discharge was planned on 4/27 however abdomen became distended again and therefore discharge was canceled.  4/30 He underwent a flex sigmoidoscopy again and decompression was again performed with placement of decompression tube.  Subjective:  The patient is overall comfortable and has no complaints.  He states he is drinking well. Assessment and Plan: Principal Problem:   Ogilvie's syndrome -Appreciate GI assistance-MiraLAX being increased-on Reglan and Dulcolax suppositories -Today abdominal x-ray shows improvement in distention - stop IVF (50 cc/hr)  Active Problems:   Essential hypertension -On amlodipine and hydralazine hyperlipidemia    Hypothyroidism -Synthroid    Stage 3b chronic kidney disease (CKD) (HCC) Creatinine was 1 7 3  on 4/27 and is now at 2.34-does not appear volume depleted or overloaded-continue to follow  Rectal polyp - Noted on flex sig Can be followed up as outpatient  UTI and possible proctitis Urine culture grew out Providencia stuartii Has been treated with a week of antibiotics  Hypokalemia and hypomagnesemia - Continue to follow and replace    Code Status: Full Code Consultants: GI Level of Care: Level of care: Med-Surg Total time on patient care: 35 DVT prophylaxis:  enoxaparin (LOVENOX) injection 40 mg Start: 04/02/23 2200     Objective:   Vitals:   04/07/23 2058 04/08/23 0548 04/08/23 0757 04/08/23 1601   BP: (!) 169/66 (!) 160/60 (!) 119/92 (!) 148/70  Pulse: 78 79 91 78  Resp:  16 16 16   Temp: 98.4 F (36.9 C) 98.6 F (37 C) 98.3 F (36.8 C) 98 F (36.7 C)  TempSrc: Oral Oral Oral Oral  SpO2: 100% 98% 100% 99%  Weight:      Height:       Filed Weights   03/31/23 2353  Weight: 104.3 kg   Exam: General exam: Appears comfortable  HEENT: oral mucosa moist Respiratory system: Clear to auscultation.  Cardiovascular system: S1 & S2 heard  Gastrointestinal system: Abdomen soft, non-tender, moderately distended. Normal bowel sounds   Extremities: No cyanosis, clubbing or edema Psychiatry:  Mood & affect appropriate.    CBC: Recent Labs  Lab 04/02/23 0709  WBC 11.3*  HGB 10.6*  HCT 32.3*  MCV 93.1  PLT 275   Basic Metabolic Panel: Recent Labs  Lab 04/02/23 0709 04/02/23 1524 04/03/23 0411 04/04/23 0126 04/05/23 1216 04/06/23 0043 04/07/23 0354  NA 141   < > 140 139 135 134* 135  K 2.7*   < > 3.2* 3.1* 3.7 3.4* 3.7  CL 107   < > 109 107 102 102 103  CO2 22   < > 18* 21* 22 23 21*  GLUCOSE 126*   < > 115* 143* 217* 206* 216*  BUN 23   < > 20 17 24* 29* 40*  CREATININE 1.81*   < > 1.70* 1.73* 2.07* 2.40* 2.34*  CALCIUM 8.5*   < > 8.2* 8.2* 8.3* 8.2* 8.6*  MG 1.2*  --  1.3* 1.4* 1.9 1.8  --    < > = values in this interval not displayed.   GFR: Estimated Creatinine Clearance: 37.2 mL/min (A) (by C-G formula based on SCr of 2.34 mg/dL (H)).  Scheduled Meds:  allopurinol  100 mg Oral Daily   amLODipine  5 mg Oral Daily   atorvastatin  20 mg Oral QHS   bisacodyl  10 mg Rectal Daily   docusate sodium  100 mg Oral BID   enoxaparin (LOVENOX) injection  40 mg Subcutaneous Q24H   feeding supplement  1 Container Oral TID BM   hydrALAZINE  25 mg Oral TID   levothyroxine  50 mcg Oral Q0600   metoCLOPramide  10 mg Oral TID AC & HS   polyethylene glycol  17 g Oral BID   potassium chloride SA  40 mEq Oral BID   sodium bicarbonate  1,300 mg Oral TID   Continuous  Infusions: Imaging and lab data was personally reviewed DG Abd 2 Views  Result Date: 04/08/2023 CLINICAL DATA:  Ileus EXAM: ABDOMEN - 2 VIEW COMPARISON:  04/07/2023 FINDINGS: Persistently dilated loops of colon measuring approximately 10 cm in diameter (previously measured up to 16 cm). No abnormally dilated loops of small bowel are evident. No free intraperitoneal air is seen on decubitus view. Decompression tube is position within the distended sigmoid colon. IMPRESSION: Improving large bowel distension. Decompression tube is position within the sigmoid colon. Electronically Signed   By: Duanne Guess D.O.   On: 04/08/2023 12:11   DG Abd 2 Views  Result Date: 04/07/2023 CLINICAL DATA:  Generalized abdominal pain EXAM: ABDOMEN - 2 VIEW COMPARISON:  X-ray 04/06/2023 FINDINGS: Once again there is severe dilatation of the large bowel in the central abdomen, sigmoid colon. Previous dimension of 17.1 cm and today estimated at 16.1 cm. Similar. Previously there were also some dilated loops of large bowel elsewhere which appear to be decreasing. The stomach is distended. Nondilated loops of small bowel. Diffuse degenerative changes of the spine and pelvis with hyperostosis. No obvious free air seen beneath the diaphragm on the upright view. IMPRESSION: Persistent severe dilatation of the sigmoid colon. The other loops of bowel dilatation are improving. No obvious free air. Please correlate with clinical history and prior CT scan of 04/01/2023 Electronically Signed   By: Karen Kays M.D.   On: 04/07/2023 12:29    LOS: 7 days   Author: Ladell Heads Cailynn Bodnar  04/08/2023 4:30 PM  To contact [facility]>   Check the care team in [facility] and look for the attending/consulting [facility] provider listed  Log into [email or web address] and use [place]'s universal password   Go to> "[facility]"  and find provider  If you still have difficulty reaching the provider, please page the [facility] (Director on Call) for the  Hospitalists listed on amion

## 2023-04-08 NOTE — Progress Notes (Signed)
Tap water enema given

## 2023-04-08 NOTE — Progress Notes (Addendum)
Patient ID: Terry Krider Sr., male   DOB: 07-30-1956, 67 y.o.   MRN: [id number]     Attending physician's note   I have taken a history, reviewed the chart, and examined the patient. I performed a substantive portion of this encounter, including complete performance of at least one of the key components, in conjunction with the APP. I agree with the APP's note, impression, and recommendations with my edits.   Flexible sigmoidoscopy yesterday with decompression of significantly dilated sigmoid, and rectal decompression tube placed.  Repeat x-ray today with sigmoid colon measuring 10 cm (improved from 16 cm yesterday), and rectal decompression tube is still in place.  I think there was some confusion in what tube was dislodged, and it was actually a rectal tube that came out prior to the sigmoidoscopy.  The decompression tube with pigtail ending is still in place, and appears to be working.  On exam, still with distended abdomen and tympany, but exam improved from yesterday.  No TTP.  - Increasing MiraLAX to bid for the time being - If short interval recurrence, we did briefly discussed a trial of neostigmine.  Briefly discussed potential medication ADR's, to include bradycardia, arrhythmia, hypotension, bronchospasm, respiratory depression, abdominal cramping, etc.  Would prefer holding off if possible. - Repeat abdominal x-ray in the morning again - Could also consider trial of Motegrity, but not sure if we can obtain that while in-house.  Would need to check with Pharmacy  Doristine Locks, DO, FACG [phone] office          Progress Note   Subjective   Day # 8  CC; abdominal distention, Ogilvie"s  Flexible sigmoidoscopy yesterday afternoon with decompression of severely dilated sigmoid colon, decompression tube was placed, moderate amount of stool was found in the rectum through the left and transverse colon   New labs today  Patient says his abdomen feels fine he has not  appreciating any abdominal pain or distention, tolerating full liquid diet  Patient says he has not passed any flatus or had a bowel movement today    Objective   Vital signs in last 24 hours: Temp:  [97.5 F (36.4 C)-98.6 F (37 C)] 98.3 F (36.8 C) (05/01 0757) Pulse Rate:  [72-91] 91 (05/01 0757) Resp:  [16-17] 16 (05/01 0757) BP: (119-183)/(50-92) 119/92 (05/01 0757) SpO2:  [97 %-100 %] 100 % (05/01 0757) Last BM Date : 04/07/23 General:    Older African-American male in NAD Heart:  Regular rate and rhythm; no murmurs Lungs: Respirations even and unlabored, lungs CTA bilaterally Abdomen: Obese, protuberant, softer than yesterday but still some distention in the mid lower abdomen and high-pitched tinkling bowel sounds  Extremities:  Without edema. Neurologic:  Alert and oriented, paraplegia #1 Psych:  Cooperative. Normal mood and affect.  Intake/Output from previous day: 04/30 0701 - 05/01 0700 In: 1000 [P.O.:600; I.V.:400] Out: 2800 [Urine:2300; Stool:500] Intake/Output this shift: No intake/output data recorded.  Lab Results: No results for input(s): "WBC", "HGB", "HCT", "PLT" in the last 72 hours. BMET Recent Labs    04/05/23 1216 04/06/23 0043 04/07/23 0354  NA 135 134* 135  K 3.7 3.4* 3.7  CL 102 102 103  CO2 22 23 21*  GLUCOSE 217* 206* 216*  BUN 24* 29* 40*  CREATININE 2.07* 2.40* 2.34*  CALCIUM 8.3* 8.2* 8.6*   LFT Recent Labs    04/05/23 1216  PROT 6.3*  ALBUMIN 2.7*  AST 19  ALT 14  ALKPHOS 89  BILITOT 0.4  PT/INR No results for input(s): "LABPROT", "INR" in the last 72 hours.  Studies/Results: DG Abd 2 Views  Result Date: 04/07/2023 CLINICAL DATA:  Generalized abdominal pain EXAM: ABDOMEN - 2 VIEW COMPARISON:  X-ray 04/06/2023 FINDINGS: Once again there is severe dilatation of the large bowel in the central abdomen, sigmoid colon. Previous dimension of 17.1 cm and today estimated at 16.1 cm. Similar. Previously there were also some  dilated loops of large bowel elsewhere which appear to be decreasing. The stomach is distended. Nondilated loops of small bowel. Diffuse degenerative changes of the spine and pelvis with hyperostosis. No obvious free air seen beneath the diaphragm on the upright view. IMPRESSION: Persistent severe dilatation of the sigmoid colon. The other loops of bowel dilatation are improving. No obvious free air. Please correlate with clinical history and prior CT scan of 04/01/2023 Electronically Signed   By: Karen Kays M.D.   On: 04/07/2023 12:29   DG Abd Portable 1V  Result Date: 04/06/2023 CLINICAL DATA:  Paralytic ileus of small intestine and colon. EXAM: PORTABLE ABDOMEN - 1 VIEW COMPARISON:  April 04, 2023. FINDINGS: There is again noted severe dilatation of sigmoid colon, now measuring 17 cm. Mildly dilated air-filled small bowel loops are noted which may represent ileus or possibly obstruction. IMPRESSION: Severe dilatation of sigmoid colon is again noted, now measuring 17 cm which is increased compared to prior exam. Electronically Signed   By: Lupita Raider M.D.   On: 04/06/2023 15:35       Assessment / Plan:    #41 67 year old African-American male, wheelchair-bound at baseline with history of prior CVA and spinal cord injury with paraplegia who was initially admitted with progressive abdominal distention. Found to have a severely dilated sigmoid colon and less severe dilation of the remainder of the colon most consistent with Ogilvie's type picture.  He had undergone flexible sigmoidoscopy with decompression on 04/02/2023.  Symptoms improved but then reoccurred within 48 hours and noted to have a very severely distended sigmoid colon yesterday at 16.1 cm.  Repeat flexible sigmoidoscopy with decompression, and placement of rectal tube yesterday afternoon with good result  Patient is comfortable today but still has some distention and tympany. he is not aware that he has been passing any flatus and  no bowel movement   #2 onset diabetes mellitus 3.  Chronic kidney disease stage III 4.  Hypertension 5.  Hypothyroidism  Plan; full liquids today Repeat abdominal films this a.m. Tapwater enema this morning and repeat this afternoon To twice daily MiraLAX Minimize narcotics TSH Can consider neostigmine if symptoms progress        Principal Problem:   Ogilvie's syndrome Active Problems:   Essential hypertension   Hyperlipidemia   Hypothyroidism   Stage 3b chronic kidney disease (CKD) (HCC)   Abnormality of rectum   Abnormal CT of the abdomen   Abdominal distension     LOS: 7 days   Amy Esterwood PA-C 04/08/2023, 9:05 AM

## 2023-04-08 NOTE — TOC Progression Note (Signed)
Transition of Care (TOC) - Progression Note    Patient Details  Name: Terry Langworthy Sr. MRN: [id number] Date of Birth: [birth year]  Transition of Care [facility]) CM/SW Contact  Carley Hammed, LCSW Phone Number: 04/08/2023, 2:25 PM  Clinical Narrative:    TOC continues to monitor pt for additional needs throughout hospital admission. TOC aware of pt's transportation needs at discharge. Please consult for any other needs that may arise.    Expected Discharge Plan: Home/Self Care Barriers to Discharge: Barriers Resolved  Expected Discharge Plan and Services   Discharge Planning Services: CM Consult Post Acute Care Choice: NA   Expected Discharge Date: 04/04/23                 DME Agency: NA       HH Arranged: NA           Social Determinants of Health (SDOH) Interventions SDOH Screenings   Tobacco Use: Low Risk  (04/07/2023)    Readmission Risk Interventions    04/04/2023    3:30 PM  Readmission Risk Prevention Plan  Transportation Screening Complete  PCP or Specialist Appt within 5-7 Days Complete  Home Care Screening Complete  Medication Review (RN CM) Referral to Pharmacy

## 2023-04-09 ENCOUNTER — Inpatient Hospital Stay (HOSPITAL_COMMUNITY): Payer: Medicare HMO

## 2023-04-09 ENCOUNTER — Encounter (HOSPITAL_COMMUNITY): Payer: Self-pay | Admitting: Gastroenterology

## 2023-04-09 LAB — CBC
HCT: 30.4 % — ABNORMAL LOW (ref 39.0–52.0)
Hemoglobin: 10 g/dL — ABNORMAL LOW (ref 13.0–17.0)
MCH: 30.7 pg (ref 26.0–34.0)
MCHC: 32.9 g/dL (ref 30.0–36.0)
MCV: 93.3 fL (ref 80.0–100.0)
Platelets: 260 10*3/uL (ref 150–400)
RBC: 3.26 MIL/uL — ABNORMAL LOW (ref 4.22–5.81)
RDW: 14.6 % (ref 11.5–15.5)
WBC: 9.8 10*3/uL (ref 4.0–10.5)
nRBC: 0 % (ref 0.0–0.2)

## 2023-04-09 LAB — BASIC METABOLIC PANEL
Anion gap: 8 (ref 5–15)
BUN: 31 mg/dL — ABNORMAL HIGH (ref 8–23)
CO2: 22 mmol/L (ref 22–32)
Calcium: 8.6 mg/dL — ABNORMAL LOW (ref 8.9–10.3)
Chloride: 104 mmol/L (ref 98–111)
Creatinine, Ser: 1.96 mg/dL — ABNORMAL HIGH (ref 0.61–1.24)
GFR, Estimated: 36 mL/min — ABNORMAL LOW (ref >60)
Glucose, Bld: 204 mg/dL — ABNORMAL HIGH (ref 70–99)
Potassium: 3.9 mmol/L (ref 3.5–5.1)
Sodium: 134 mmol/L — ABNORMAL LOW (ref 135–145)

## 2023-04-09 LAB — MAGNESIUM: Magnesium: 1.6 mg/dL — ABNORMAL LOW (ref 1.7–2.4)

## 2023-04-09 LAB — HEMOGLOBIN A1C
Hgb A1c MFr Bld: 7.1 % — ABNORMAL HIGH (ref 4.8–5.6)
Mean Plasma Glucose: 157.07 mg/dL

## 2023-04-09 MED ORDER — HYDRALAZINE HCL 25 MG PO TABS
25.0000 mg | ORAL_TABLET | Freq: Three times a day (TID) | ORAL | Status: DC | PRN
  Administered 2023-04-10: 25 mg via ORAL

## 2023-04-09 MED ORDER — METHYLNALTREXONE BROMIDE 12 MG/0.6ML ~~LOC~~ SOLN
12.0000 mg | Freq: Once | SUBCUTANEOUS | Status: AC
  Administered 2023-04-09: 12 mg via SUBCUTANEOUS
  Filled 2023-04-09: qty 0.6

## 2023-04-09 MED ORDER — MAGNESIUM SULFATE IN D5W 1-5 GM/100ML-% IV SOLN
1.0000 g | Freq: Once | INTRAVENOUS | Status: AC
  Administered 2023-04-09: 1 g via INTRAVENOUS
  Filled 2023-04-09 (×2): qty 100

## 2023-04-09 NOTE — Progress Notes (Addendum)
[facility] Progress Note  Patient: Terry Nordin Sr.     ZOX:[id number]  DOA: 03/31/2023   PCP: Loura Back, NP       Brief hospital course: This is a 67 year old male who is paraplegic, CVA, peripheral vascular disease, CKD stage III AAA and hypertension who presented to the ED with abdominal distention.  He is suspected to have Ogilvie syndrome. On 4/25 he underwent a flex sig which showed a dilated colon above the anus that was decompressed.  There was a polyp in the distal rectum that was not removed. Discharge was planned on 4/27 however abdomen became distended again and therefore discharge was canceled. 4/30 He underwent a flex sigmoidoscopy again and decompression was again performed with placement of decompression tube.  Subjective:  Patient reports that he stays with family, moves around with the help of wheelchair, has diabetic neuropathy in his feet, right upper extremity weakness from prior stroke but denies sensory level.  Denies abdominal pain and cannot tell if his abdomen is more or less distended.  Tolerating diet and denies nausea or vomiting.  2 BMs documented on 5/1.  Assessment and Plan: Principal Problem:   Ogilvie's syndrome [facility] continues to follow.  Dr. Frankey Shown input is much appreciated.  KUB from 5/2 shows sigmoid colon distention worse from 5/1-Per GI 14.9 cm sigmoid colon.  Rectal decompression tube in place, GI recommends flushing rectal decompression tube, continue MiraLAX, Dulcolax suppository, trial of Relistor with next option being neostigmine and last option being diverting end colostomy.  Full liquid diet.  Active Problems:   Essential hypertension On amlodipine 5 Mg daily and hydralazine 25 Mg 3 times daily.  Mildly uncontrolled at times.  Could consider increasing amlodipine to 10 Mg daily.  Hyperlipidemia Continue atorvastatin.    Hypothyroidism -Synthroid  Acute kidney injury on stage 3b chronic kidney disease (CKD)  (HCC) Baseline creatinine not entirely clear but may be in the 1.7-1.9 range.  Creatinine had peaked to 2.4 on 4/29 and is improved to 1.96.  Clinically euvolemic.  Continue to monitor BMP closely and avoid nephrotoxics.  Rectal polyp - Noted on flex sig Can be followed up as outpatient  UTI and possible proctitis Urine culture grew out Providencia stuartii Has been treated with a week of antibiotics.  Off of antibiotics since 4/30.  Hypokalemia and hypomagnesemia - Potassium is normal.  Replace hypomagnesemia and follow.  Hyperglycemia without DM diagnosis: Last A1c 03/30/2022: 5.2.  Multiple random/fasting blood sugars on BMP in the low 200s.  Recheck A1c.  Consider sensitive SSI.  ACP documents: None present.   Code Status: Full Code Consultants:  GI DVT prophylaxis:  enoxaparin (LOVENOX) injection 40 mg Start: 04/02/23 2200 Family communication: son via phone 5/2.     Objective:   Vitals:   04/08/23 1601 04/08/23 2002 04/09/23 0358 04/09/23 0837  BP: (!) 148/70 (!) 174/73 (!) 144/77 (!) 145/58  Pulse: 78 86 78 77  Resp: 16 17 18 17   Temp: 98 F (36.7 C) 98 F (36.7 C) 98 F (36.7 C) 97.9 F (36.6 C)  TempSrc: Oral Oral Oral Oral  SpO2: 99% 100% 100% 100%  Weight:      Height:       Filed Weights   03/31/23 2353  Weight: 104.3 kg   Exam: General exam: Middle-age male, moderately built and obese lying comfortably supine in bed without distress. Respiratory system: Clear to auscultation. Respiratory effort normal. Cardiovascular system: S1 & S2 heard, RRR. No JVD, murmurs, rubs, gallops or  clicks. No pedal edema.  Not on telemetry. Gastrointestinal system: Abdomen is moderate to moderately distended but not tense, nontender, soft.  Tympanitic few bowel sounds heard.  No organomegaly or masses appreciated. Central nervous system: Alert and oriented. No focal neurological deficits. Extremities: Bilateral lower extremities.  2 x 5 power, right upper extremity  graded 3 x 5 power.  Left upper extremity appears to have 4 x 5 power at least. Skin: No rashes, lesions or ulcers Psychiatry: Judgement and insight appear normal. Mood & affect appropriate.     CBC: Recent Labs  Lab 04/09/23 0450  WBC 9.8  HGB 10.0*  HCT 30.4*  MCV 93.3  PLT 260   Basic Metabolic Panel: Recent Labs  Lab 04/03/23 0411 04/04/23 0126 04/05/23 1216 04/06/23 0043 04/07/23 0354 04/09/23 0450  NA 140 139 135 134* 135 134*  K 3.2* 3.1* 3.7 3.4* 3.7 3.9  CL 109 107 102 102 103 104  CO2 18* 21* 22 23 21* 22  GLUCOSE 115* 143* 217* 206* 216* 204*  BUN 20 17 24* 29* 40* 31*  CREATININE 1.70* 1.73* 2.07* 2.40* 2.34* 1.96*  CALCIUM 8.2* 8.2* 8.3* 8.2* 8.6* 8.6*  MG 1.3* 1.4* 1.9 1.8  --  1.6*   GFR: Estimated Creatinine Clearance: 44.4 mL/min (A) (by C-G formula based on SCr of 1.96 mg/dL (H)).  Scheduled Meds:  allopurinol  100 mg Oral Daily   amLODipine  5 mg Oral Daily   atorvastatin  20 mg Oral QHS   bisacodyl  10 mg Rectal Daily   docusate sodium  100 mg Oral BID   enoxaparin (LOVENOX) injection  40 mg Subcutaneous Q24H   feeding supplement  1 Container Oral TID BM   hydrALAZINE  25 mg Oral TID   levothyroxine  50 mcg Oral Q0600   methylnaltrexone  12 mg Subcutaneous Once   metoCLOPramide  10 mg Oral TID AC & HS   polyethylene glycol  17 g Oral BID   potassium chloride SA  40 mEq Oral BID   sodium bicarbonate  1,300 mg Oral TID   Continuous Infusions: Imaging and lab data was personally reviewed DG Abd 2 Views  Result Date: 04/09/2023 CLINICAL DATA:  Ileus is EXAM: ABDOMEN - 2 VIEW COMPARISON:  04/08/2023 FINDINGS: Catheter projects over RIGHT mid abdomen and pelvis. Air-filled large and small bowel loops with gaseous distention of sigmoid loop slightly increased from previous exam. No bowel wall thickening or free air. Degenerative changes lumbar spine. No urinary tract calcifications. IMPRESSION: Gaseous distention of sigmoid colon slightly  increased from previous exam. Electronically Signed   By: Ulyses Southward M.D.   On: 04/09/2023 10:32   DG Abd 2 Views  Result Date: 04/08/2023 CLINICAL DATA:  Ileus EXAM: ABDOMEN - 2 VIEW COMPARISON:  04/07/2023 FINDINGS: Persistently dilated loops of colon measuring approximately 10 cm in diameter (previously measured up to 16 cm). No abnormally dilated loops of small bowel are evident. No free intraperitoneal air is seen on decubitus view. Decompression tube is position within the distended sigmoid colon. IMPRESSION: Improving large bowel distension. Decompression tube is position within the sigmoid colon. Electronically Signed   By: Duanne Guess D.O.   On: 04/08/2023 12:11    LOS: 8 days   Marcellus Scott, MD,  FACP, [facility], [facility], [facility], [facility]   [facility] [place]     To contact the attending provider between 7A-7P or the covering provider during after hours 7P-7A, please log into the web site  [email or web address] and access using universal [place] password for that web site. If you do not have the password, please call the hospital operator.

## 2023-04-09 NOTE — Progress Notes (Signed)
Gave tap water enema

## 2023-04-09 NOTE — Progress Notes (Addendum)
Patient ID: Terry Jarrett Sr., male   DOB: [birth year], 67 y.o.   MRN: [id number]     Attending physician's note   I have taken a history, reviewed the chart, and examined the patient. I performed a substantive portion of this encounter, including complete performance of at least one of the key components, in conjunction with the APP. I agree with the APP's note, impression, and recommendations with my edits.   Repeat abdominal x-ray today with 14.9 cm sigmoid colon, up from yesterday.  Rectal decompression tube still in appropriate position.  He otherwise feels fine and no abdominal pain.  - Try flushing rectal decompression tube - Will trial course of Relistor - Depending on response to the above interventions, neostigmine would be next option, but certainly carries ADR profile as previously outlined with patient - Continue MiraLAX - Last option would be for diverting end colostomy  Jumana Paccione, DO, FACG [phone] office          Progress Note   Subjective   Day # 9  CC; abdominal distention, severe sigmoid distention/probable Ogilvie syndrome  Lab-potassium 3.1/BUN 31/creatinine 1.9/magnesium 1.6 WBC 9.8/hemoglobin 10/  Patient says he feels fine, tolerated breakfast without difficulty, remembers having the enemas yesterday but uncertain about the results.    Objective   Vital signs in last 24 hours: Temp:  [97.9 F (36.6 C)-98 F (36.7 C)] 97.9 F (36.6 C) (05/02 0837) Pulse Rate:  [77-86] 77 (05/02 0837) Resp:  [16-18] 17 (05/02 0837) BP: (144-174)/(58-77) 145/58 (05/02 0837) SpO2:  [99 %-100 %] 100 % (05/02 0837) Last BM Date : 04/08/23 General:    African-American male in NAD Heart:  Regular rate and rhythm; no murmurs Lungs: Respirations even and unlabored, lungs CTA bilaterally Abdomen increased abdominal distention, fairly tight and tympanitic high-pitched bowel sounds palpable large loop of bowel in the anterior abdomen Neurologic:  Alert and  oriented, paraplegia, right upper extremity deficits Psych:  Cooperative. Normal mood and affect.  Intake/Output from previous day: 05/01 0701 - 05/02 0700 In: 1080 [P.O.:1080] Out: 2750 [Urine:2750] Intake/Output this shift: No intake/output data recorded.  Lab Results: Recent Labs    04/09/23 0450  WBC 9.8  HGB 10.0*  HCT 30.4*  PLT 260   BMET Recent Labs    04/07/23 0354 04/09/23 0450  NA 135 134*  K 3.7 3.9  CL 103 104  CO2 21* 22  GLUCOSE 216* 204*  BUN 40* 31*  CREATININE 2.34* 1.96*  CALCIUM 8.6* 8.6*   LFT No results for input(s): "PROT", "ALBUMIN", "AST", "ALT", "ALKPHOS", "BILITOT", "BILIDIR", "IBILI" in the last 72 hours. PT/INR No results for input(s): "LABPROT", "INR" in the last 72 hours.  Studies/Results: DG Abd 2 Views  Result Date: 04/08/2023 CLINICAL DATA:  Ileus EXAM: ABDOMEN - 2 VIEW COMPARISON:  04/07/2023 FINDINGS: Persistently dilated loops of colon measuring approximately 10 cm in diameter (previously measured up to 16 cm). No abnormally dilated loops of small bowel are evident. No free intraperitoneal air is seen on decubitus view. Decompression tube is position within the distended sigmoid colon. IMPRESSION: Improving large bowel distension. Decompression tube is position within the sigmoid colon. Electronically Signed   By: Duanne Guess D.O.   On: 04/08/2023 12:11   DG Abd 2 Views  Result Date: 04/07/2023 CLINICAL DATA:  Generalized abdominal pain EXAM: ABDOMEN - 2 VIEW COMPARISON:  X-ray 04/06/2023 FINDINGS: Once again there is severe dilatation of the large bowel in the central abdomen, sigmoid colon. Previous dimension of 17.1 cm  and today estimated at 16.1 cm. Similar. Previously there were also some dilated loops of large bowel elsewhere which appear to be decreasing. The stomach is distended. Nondilated loops of small bowel. Diffuse degenerative changes of the spine and pelvis with hyperostosis. No obvious free air seen beneath the  diaphragm on the upright view. IMPRESSION: Persistent severe dilatation of the sigmoid colon. The other loops of bowel dilatation are improving. No obvious free air. Please correlate with clinical history and prior CT scan of 04/01/2023 Electronically Signed   By: Karen Kays M.D.   On: 04/07/2023 12:29       Assessment / Plan:     #62 67 year old African-American male, wheelchair-bound at baseline with history of prior CVA, history of spinal cord injury with paraplegia admitted with progressive abdominal distention.  Patient has not had any prior similar issues. Found to have severely dilated sigmoid colon and less severe dilation of the remainder of the colon consistent with local obesity  Is now status post 2 flexible sigmoidoscopies with decompression and a second procedure was done 04/07/2023, and rectal tube left in place.  Abdominal films yesterday with significant improvement, he has been on twice daily MiraLAX, and had 2 tapwater enemas yesterday Unfortunately has significant increase in abdominal distention today consistent with a recurrence of the severe sigmoid dilation  #2 adult onset diabetes mellitus #3.  Chronic kidney disease stage III #4.  Hypertension #5  Hypothyroidism #6.  UTI  Plan; n.p.o. Will get stat abdominal films Anticipate will show severely dilated sigmoid colon again- will try course of Relistor If Relistor ineffective, other options are neostigmine, and/or surgical consultation to consider colostomy   Principal Problem:   Ogilvie's syndrome Active Problems:   Essential hypertension   Hyperlipidemia   Hypothyroidism   Stage 3b chronic kidney disease (CKD) (HCC)   Abnormality of rectum   Abnormal CT of the abdomen   Abdominal distension     LOS: 8 days   Amy Esterwood PA-C 04/09/2023, 8:44 AM

## 2023-04-09 NOTE — Progress Notes (Signed)
Unable to flush decompression tube due to end not being on tube, unable to connect to tubing securely. Tried flushing with resistance and no NS went in. Spoke with MD and endo to verify tube was not intact on the end. Pt is having liquid water/stool with distention of abdomen still.

## 2023-04-10 ENCOUNTER — Inpatient Hospital Stay (HOSPITAL_COMMUNITY): Payer: Medicare HMO

## 2023-04-10 DIAGNOSIS — E785 Hyperlipidemia, unspecified: Secondary | ICD-10-CM

## 2023-04-10 LAB — BASIC METABOLIC PANEL
Anion gap: 7 (ref 5–15)
BUN: 30 mg/dL — ABNORMAL HIGH (ref 8–23)
CO2: 24 mmol/L (ref 22–32)
Calcium: 8.8 mg/dL — ABNORMAL LOW (ref 8.9–10.3)
Chloride: 105 mmol/L (ref 98–111)
Creatinine, Ser: 2.05 mg/dL — ABNORMAL HIGH (ref 0.61–1.24)
GFR, Estimated: 34 mL/min — ABNORMAL LOW (ref >60)
Glucose, Bld: 148 mg/dL — ABNORMAL HIGH (ref 70–99)
Potassium: 4.7 mmol/L (ref 3.5–5.1)
Sodium: 136 mmol/L (ref 135–145)

## 2023-04-10 LAB — MAGNESIUM: Magnesium: 1.8 mg/dL (ref 1.7–2.4)

## 2023-04-10 LAB — GLUCOSE, CAPILLARY
Glucose-Capillary: 193 mg/dL — ABNORMAL HIGH (ref 70–99)
Glucose-Capillary: 273 mg/dL — ABNORMAL HIGH (ref 70–99)

## 2023-04-10 MED ORDER — METHYLNALTREXONE BROMIDE 12 MG/0.6ML ~~LOC~~ SOLN
6.0000 mg | Freq: Once | SUBCUTANEOUS | Status: AC
  Administered 2023-04-10: 6 mg via SUBCUTANEOUS
  Filled 2023-04-10 (×2): qty 0.6

## 2023-04-10 MED ORDER — INSULIN ASPART 100 UNIT/ML IJ SOLN
0.0000 [IU] | Freq: Three times a day (TID) | INTRAMUSCULAR | Status: DC
  Administered 2023-04-10: 1 [IU] via SUBCUTANEOUS
  Administered 2023-04-11: 2 [IU] via SUBCUTANEOUS

## 2023-04-10 MED ORDER — BOOST PLUS PO LIQD
237.0000 mL | Freq: Three times a day (TID) | ORAL | Status: DC
  Administered 2023-04-10 – 2023-04-11 (×2): 237 mL via ORAL
  Filled 2023-04-10 (×6): qty 237

## 2023-04-10 MED ORDER — POTASSIUM CHLORIDE CRYS ER 20 MEQ PO TBCR
40.0000 meq | EXTENDED_RELEASE_TABLET | Freq: Every day | ORAL | Status: DC
  Administered 2023-04-11: 40 meq via ORAL
  Filled 2023-04-10: qty 2

## 2023-04-10 MED ORDER — METHYLNALTREXONE BROMIDE 12 MG/0.6ML ~~LOC~~ SOLN: 12.0000 mg | Freq: Once | SUBCUTANEOUS | Status: DC

## 2023-04-10 NOTE — Progress Notes (Addendum)
Patient ID: Terry Chapple Sr., male   DOB: 1956/03/26, 67 y.o.   MRN: [id number]     Attending physician's note   I have taken a history, reviewed the chart, and examined the patient. I performed a substantive portion of this encounter, including complete performance of at least one of the key components, in conjunction with the APP. I agree with the APP's note, impression, and recommendations with my edits.   Exam appears improved and repeat abdominal x-ray looks overall stable, although the rectal decompression continue looks like it is now in the rectum, and no longer in the sigmoid colon.  He is otherwise without any pain.  Tolerating liquid diet. 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  Relistor subcu started yesterday  Labs-sodium 136/potassium 4.7/BUN 30/creatinine 2.0 Magnesium 1.8  Abdominal films this a.m.-persistent moderate gaseous distention of the sigmoid colon similar to yesterday slightly improved from last week  Patient says he had passed some stool during the night,(3 stools documented) and had a lot of flatus last night He is not having any abdominal pain, tolerating clear liquids and is hungry   Objective   Vital signs in last 24 hours: Temp:  [98.1 F (36.7 C)-98.7 F (37.1 C)] 98.4 F (36.9 C) (05/03 0712) Pulse Rate:  [79-85] 79 (05/03 0712) Resp:  [16-18] 16 (05/03 0712) BP: (129-152)/(61-70) 129/70 (05/03 0712) SpO2:  [98 %-100 %] 100 % (05/03 0712) Last BM Date : 04/09/23 General:    Older African-American male in NAD Heart:  Regular rate and rhythm; no murmurs Lungs: Respirations even and unlabored, lungs CTA bilaterally Abdomen: Remains distended but softer than yesterday, bowel sounds present, he is nontender.   Extremities:  Without edema. Neurologic:  Alert and oriented,  grossly normal neurologically. Psych:  Cooperative. Normal mood and affect.  Intake/Output from previous day: 05/02 0701 - 05/03 0700 In: 920 [P.O.:920] Out: 2400 [Urine:2400] Intake/Output this shift: No intake/output data recorded.  Lab Results: Recent Labs    04/09/23 0450  WBC 9.8  HGB 10.0*  HCT 30.4*  PLT 260   BMET Recent Labs    04/09/23 0450 04/10/23 0441  NA 134* 136  K 3.9 4.7  CL 104 105  CO2 22 24  GLUCOSE 204* 148*  BUN 31* 30*  CREATININE 1.96* 2.05*  CALCIUM 8.6* 8.8*   LFT No results for input(s): "PROT", "ALBUMIN", "AST", "ALT", "ALKPHOS", "BILITOT", "BILIDIR", "IBILI" in the last 72 hours. PT/INR No results for input(s): "LABPROT", "INR" in the last 72 hours.  Studies/Results: DG Abd 2 Views  Result Date: 04/10/2023 CLINICAL DATA:  Ileus. EXAM: ABDOMEN - 2 VIEW COMPARISON:  Multiple abdominal radiographs, most recently done 04/09/2023. Abdominopelvic CT 04/01/2023. FINDINGS: Moderate gaseous distention of the sigmoid colon is unchanged from yesterday's radiographs and slightly improved from the CT of last week. There is no evidence of bowel wall thickening or pneumoperitoneum. No significant small bowel distention identified. There is a small caliber catheter projecting over the sacrum which may be within the distal colon. No acute osseous findings are evident. IMPRESSION: Persistent moderate gaseous distention of the sigmoid colon, similar to yesterday's radiographs and slightly improved from last week. Electronically Signed   By: Carey Bullocks M.D.   On: 04/10/2023 08:58  DG Abd 2 Views  Result Date: 04/09/2023 CLINICAL DATA:  Ileus is EXAM: ABDOMEN - 2 VIEW COMPARISON:  04/08/2023 FINDINGS: Catheter projects over RIGHT mid abdomen and pelvis. Air-filled large and small bowel loops with gaseous distention of sigmoid loop slightly increased from previous exam. No bowel wall thickening  or free air. Degenerative changes lumbar spine. No urinary tract calcifications. IMPRESSION: Gaseous distention of sigmoid colon slightly increased from previous exam. Electronically Signed   By: Ulyses Southward M.D.   On: 04/09/2023 10:32   DG Abd 2 Views  Result Date: 04/08/2023 CLINICAL DATA:  Ileus EXAM: ABDOMEN - 2 VIEW COMPARISON:  04/07/2023 FINDINGS: Persistently dilated loops of colon measuring approximately 10 cm in diameter (previously measured up to 16 cm). No abnormally dilated loops of small bowel are evident. No free intraperitoneal air is seen on decubitus view. Decompression tube is position within the distended sigmoid colon. IMPRESSION: Improving large bowel distension. Decompression tube is position within the sigmoid colon. Electronically Signed   By: Duanne Guess D.O.   On: 04/08/2023 12:11       Assessment / Plan:    #30 67 year old African-American male, wheelchair-bound at baseline with history of prior CVA, history of spinal cord injury with paraplegia admitted with progressive abdominal distention at home, no prior issues with bowel dysfunction  Found to have severely dilated sigmoid colon and less severe dilation of the remainder of the colon consistent with an Ogilvie's picture  He has had 2 flexible sigmoidoscopies with decompression the last was done on 04/07/2023 with rectal tube left in place Abdominal films yesterday showed persistent severe distention of the sigmoid colon slightly improved over prior  Abdominal films today show mild improvement  Initiated Relistor yesterday, continuing MiraLAX twice daily, and rectal tube still in place though unable to flush yesterday as did not have an end This he suspect the tube is clogged)  #2 adult onset diabetes mellitus 3.  Chronic kidney disease stage III 4.  Hypertension 5.  Hypothyroidism 6.  UTI  Plan; advance to full liquids with supplements, if does okay today can advance to solid food tomorrow  Continue  current regimen with MiraLAX twice daily Will give another dose of 12 mg Relistor today  Per pharmacy 12 mg dose exceeds recommended with the patient's creatinine clearance, will give 6 mg dose today.         Principal Problem:   Ogilvie's syndrome Active Problems:   Essential hypertension   Hyperlipidemia   Hypothyroidism   Stage 3b chronic kidney disease (CKD) (HCC)   Abnormality of rectum   Abnormal CT of the abdomen   Abdominal distension     LOS: 9 days   Amy Esterwood PA-C 04/10/2023, 9:32 AM

## 2023-04-10 NOTE — Progress Notes (Signed)
[facility] Progress Note  Patient: Terry Cockburn Sr.     ZOX:[id number]  DOA: 03/31/2023   PCP: Loura Back, NP       Brief hospital course: This is a 67 year old male who is paraplegic, CVA, peripheral vascular disease, CKD stage III AAA and hypertension who presented to the ED with abdominal distention.  He is suspected to have Ogilvie syndrome. On 4/25 he underwent a flex sig which showed a dilated colon above the anus that was decompressed.  There was a polyp in the distal rectum that was not removed. Discharge was planned on 4/27 however abdomen became distended again and therefore discharge was canceled. 4/30 He underwent a flex sigmoidoscopy again and decompression was again performed with placement of decompression tube.  Subjective:  Patient reports that he stays with family, moves around with the help of wheelchair, has diabetic neuropathy in his feet, right upper extremity weakness from prior stroke but denies sensory level.   No abdominal pain.  States that he had BM and had to be cleaned up overnight.  3 BMs documented in the last 24 hours.  Tolerating diet without nausea or vomiting.  Assessment and Plan: Principal Problem:   Ogilvie's syndrome [facility] continues to follow and there input is much appreciated.  Following serial abdominal x-rays.  Per GI, abdominal films today show mild improvement.  Rectal decompression tube in place, unsuccessful at this to flush rectal tube on 5/2, continue MiraLAX twice a day, Dulcolax suppository, trial of Relistor yesterday and again on 5/3, with next option being neostigmine and last option being diverting end colostomy.  Per GI, advancing to full liquids today and soft diet tomorrow.  Active Problems:   Essential hypertension On amlodipine 5 Mg daily and hydralazine 25 Mg 3 times daily.  Mildly uncontrolled at times.  Could consider increasing amlodipine to 10 Mg daily.  BP better.  Hyperlipidemia Continue atorvastatin.     Hypothyroidism -Synthroid  Acute kidney injury on stage 3b chronic kidney disease (CKD) (HCC) Baseline creatinine not entirely clear but may be in the 1.7-1.9 range.  Creatinine had peaked to 2.4 on 4/29 and is improved to 1.96.  Clinically euvolemic.  Continue to monitor BMP closely and avoid nephrotoxics.  Creatinine up to 2.5 but GFR has not really changed compared to yesterday.  Rectal polyp - Noted on flex sig Can be followed up as outpatient  UTI and possible proctitis Urine culture grew out Providencia stuartii Has been treated with a week of antibiotics.  Off of antibiotics since 4/30.  Hypokalemia and hypomagnesemia - Potassium 4 and 7 and magnesium 1.8.  Continue to trend daily BMP.  Patient is on scheduled potassium supplements and will reduce potassium supplements from 40 mEq twice daily to once daily to avoid hyperkalemia.  Newly diagnosed type II DM with hyperglycemia: Last A1c 03/30/2022: 5.2.  Hemoglobin A1c 5/2: 7.1.  Started very sensitive SSI.  ACP documents: None present.   Code Status: Full Code Consultants: [facility] DVT prophylaxis:  enoxaparin (LOVENOX) injection 40 mg Start: 04/02/23 2200 Family communication: son via phone 5/2.  Advised son that the most last option would be surgery and we are attempting to avoid that as much as possible.  He verbalized understanding.     Objective:   Vitals:   04/09/23 1528 04/09/23 2100 04/10/23 0504 04/10/23 0712  BP: (!) 143/61 (!) 152/61 (!) 144/63 129/70  Pulse: 80 85 83 79  Resp: 18   16  Temp: 98.2 F (36.8 C) 98.1  F (36.7 C) 98.7 F (37.1 C) 98.4 F (36.9 C)  TempSrc: Oral Oral Oral Oral  SpO2: 99% 99% 98% 100%  Weight:      Height:       Filed Weights   03/31/23 2353  Weight: 104.3 kg   Exam: General exam: Middle-age male, moderately built and obese lying comfortably supine in bed without distress. Respiratory system: Clear to auscultation. Respiratory effort normal. Cardiovascular system: S1 &  S2 heard, RRR. No JVD, murmurs, rubs, gallops or clicks. No pedal edema.  Not on telemetry. Gastrointestinal system: Abdomen is moderately distended but definitely less compared to yesterday.  Soft.  Not tense.  Not tender.  Normal bowel sounds heard.   Central nervous system: Alert and oriented. No focal neurological deficits. Extremities: Bilateral lower extremities.  2 x 5 power, right upper extremity graded 3 x 5 power.  Left upper extremity appears to have 4 x 5 power at least. Skin: No rashes, lesions or ulcers Psychiatry: Judgement and insight appear normal. Mood & affect appropriate.     CBC: Recent Labs  Lab 04/09/23 0450  WBC 9.8  HGB 10.0*  HCT 30.4*  MCV 93.3  PLT 260   Basic Metabolic Panel: Recent Labs  Lab 04/04/23 0126 04/05/23 1216 04/06/23 0043 04/07/23 0354 04/09/23 0450 04/10/23 0441  NA 139 135 134* 135 134* 136  K 3.1* 3.7 3.4* 3.7 3.9 4.7  CL 107 102 102 103 104 105  CO2 21* 22 23 21* 22 24  GLUCOSE 143* 217* 206* 216* 204* 148*  BUN 17 24* 29* 40* 31* 30*  CREATININE 1.73* 2.07* 2.40* 2.34* 1.96* 2.05*  CALCIUM 8.2* 8.3* 8.2* 8.6* 8.6* 8.8*  MG 1.4* 1.9 1.8  --  1.6* 1.8   GFR: Estimated Creatinine Clearance: 42.5 mL/min (A) (by C-G formula based on SCr of 2.05 mg/dL (H)).  Scheduled Meds:  allopurinol  100 mg Oral Daily   amLODipine  5 mg Oral Daily   atorvastatin  20 mg Oral QHS   bisacodyl  10 mg Rectal Daily   docusate sodium  100 mg Oral BID   enoxaparin (LOVENOX) injection  40 mg Subcutaneous Q24H   feeding supplement  1 Container Oral TID BM   hydrALAZINE  25 mg Oral TID   levothyroxine  50 mcg Oral Q0600   metoCLOPramide  10 mg Oral TID AC & HS   polyethylene glycol  17 g Oral BID   potassium chloride SA  40 mEq Oral BID   sodium bicarbonate  1,300 mg Oral TID   Continuous Infusions: Imaging and lab data was personally reviewed DG Abd 2 Views  Result Date: 04/10/2023 CLINICAL DATA:  Ileus. EXAM: ABDOMEN - 2 VIEW COMPARISON:   Multiple abdominal radiographs, most recently done 04/09/2023. Abdominopelvic CT 04/01/2023. FINDINGS: Moderate gaseous distention of the sigmoid colon is unchanged from yesterday's radiographs and slightly improved from the CT of last week. There is no evidence of bowel wall thickening or pneumoperitoneum. No significant small bowel distention identified. There is a small caliber catheter projecting over the sacrum which may be within the distal colon. No acute osseous findings are evident. IMPRESSION: Persistent moderate gaseous distention of the sigmoid colon, similar to yesterday's radiographs and slightly improved from last week. Electronically Signed   By: Carey Bullocks M.D.   On: 04/10/2023 08:58   DG Abd 2 Views  Result Date: 04/09/2023 CLINICAL DATA:  Ileus is EXAM: ABDOMEN - 2 VIEW COMPARISON:  04/08/2023 FINDINGS: Catheter projects over RIGHT mid  abdomen and pelvis. Air-filled large and small bowel loops with gaseous distention of sigmoid loop slightly increased from previous exam. No bowel wall thickening or free air. Degenerative changes lumbar spine. No urinary tract calcifications. IMPRESSION: Gaseous distention of sigmoid colon slightly increased from previous exam. Electronically Signed   By: Ulyses Southward M.D.   On: 04/09/2023 10:32    LOS: 9 days   Marcellus Scott, MD,  FACP, [facility], [facility], [facility], [facility]   [facility] [place]     To contact the attending provider between 7A-7P or the covering provider during after hours 7P-7A, please log into the web site [email or web address] and access using universal [place] password for that web site. If you do not have the password, please call the hospital operator.

## 2023-04-10 NOTE — Care Management Important Message (Signed)
Important Message  Patient Details  Name: Terry Saade Sr. MRN: [id number] Date of Birth: [birth year]   Medicare Important Message Given:  Yes     Sherilyn Banker 04/10/2023, 4:08 PM

## 2023-04-10 NOTE — Progress Notes (Signed)
Nutrition Follow-up  DOCUMENTATION CODES:   Not applicable  INTERVENTION:  Discontinue Boost Breeze Boost Plus po TID, each supplement provides 360 kcal and 14 grams of protein Request updated weight  NUTRITION DIAGNOSIS:   Inadequate oral intake related to poor appetite, altered GI function as evidenced by per patient/family report. - remains applicable  GOAL:   Patient will meet greater than or equal to 90% of their needs - progressing  MONITOR:   PO intake, Supplement acceptance, Labs, Diet advancement, Weight trends  REASON FOR ASSESSMENT:   Consult Other (Comment) (nutrition goals)  ASSESSMENT:   Pt admitted with abdominal distension r/t Ogilvie's syndrome. PMH significant for remote spinal cord injury with paraplegia, CVA, HTN, HLD, stage 3a CKD and PVD s/p toe amputations.  4/25 - s/p flex sig- findings of dilated colon, consider rectal tube + reglan if ongoing distension; No etiology for cause noted 5/2 - KUB shows worsening sigmoid colon distension  + rectal decompression tube GI added relistor yesterday, continue miralax. If does not continue to improve, next option being neostigmine and last option being diverting end colostomy.    Abd imaging this morning shows persistent moderate gaseous distension of the sigmoid colon similar to yesterday.   Spoke to pt at bedside. Observed pt consumed 100% of jello, 1.5 bowls of broth from breakfast tray this morning. Pt reports that he is tolerating his diet well, no n/v.   Per GI, if  tolerates full liquids with supplements today plans to advance to solid foods tomorrow.   Medications: dulcolax suppository, colace, SSI 0-6 units TID, reglan, miralax, klor-con, sodium bicarbonate  Labs: BUN 30, Cr 2.05, GFR 34, HgbA1c 7.1%  Diet Order:   Diet Order             Diet full liquid Room service appropriate? Yes; Fluid consistency: Thin  Diet effective now           Diet - low sodium heart healthy                    EDUCATION NEEDS:   Education needs have been addressed  Skin:  Skin Assessment: Reviewed RN Assessment  Last BM:  5/3 (type 7)  Height:   Ht Readings from Last 1 Encounters:  03/31/23 6' (1.829 m)    Weight:   Wt Readings from Last 1 Encounters:  03/31/23 104.3 kg    Ideal Body Weight:  80.9 kg  BMI:  Body mass index is 31.19 kg/m.  Estimated Nutritional Needs:   Kcal:  2000-2200  Protein:  100-115g  Fluid:  >/=2L  Drusilla Kanner, RDN, LDN Clinical Nutrition

## 2023-04-11 DIAGNOSIS — N390 Urinary tract infection, site not specified: Secondary | ICD-10-CM

## 2023-04-11 LAB — BASIC METABOLIC PANEL
Anion gap: 9 (ref 5–15)
BUN: 29 mg/dL — ABNORMAL HIGH (ref 8–23)
CO2: 24 mmol/L (ref 22–32)
Calcium: 8.9 mg/dL (ref 8.9–10.3)
Chloride: 101 mmol/L (ref 98–111)
Creatinine, Ser: 2.02 mg/dL — ABNORMAL HIGH (ref 0.61–1.24)
GFR, Estimated: 35 mL/min — ABNORMAL LOW (ref >60)
Glucose, Bld: 145 mg/dL — ABNORMAL HIGH (ref 70–99)
Potassium: 4.2 mmol/L (ref 3.5–5.1)
Sodium: 134 mmol/L — ABNORMAL LOW (ref 135–145)

## 2023-04-11 LAB — GLUCOSE, CAPILLARY
Glucose-Capillary: 137 mg/dL — ABNORMAL HIGH (ref 70–99)
Glucose-Capillary: 229 mg/dL — ABNORMAL HIGH (ref 70–99)
Glucose-Capillary: 154 mg/dL — ABNORMAL HIGH (ref 70–99)

## 2023-04-11 LAB — MAGNESIUM: Magnesium: 1.9 mg/dL (ref 1.7–2.4)

## 2023-04-11 MED ORDER — METHYLNALTREXONE BROMIDE 12 MG/0.6ML ~~LOC~~ SOLN
6.0000 mg | SUBCUTANEOUS | Status: DC
  Filled 2023-04-11: qty 0.6

## 2023-04-11 MED ORDER — POLYETHYLENE GLYCOL 3350 17 G PO PACK: 17.0000 g | PACK | Freq: Two times a day (BID) | ORAL | 1 refills | Status: AC

## 2023-04-11 MED ORDER — AMLODIPINE BESYLATE 10 MG PO TABS: 10.0000 mg | ORAL_TABLET | Freq: Every day | ORAL | 1 refills | Status: DC

## 2023-04-11 MED ORDER — POTASSIUM CHLORIDE CRYS ER 20 MEQ PO TBCR: 40.0000 meq | EXTENDED_RELEASE_TABLET | Freq: Every day | ORAL | Status: DC

## 2023-04-11 MED ORDER — LEVOTHYROXINE SODIUM 50 MCG PO TABS: 50.0000 ug | ORAL_TABLET | Freq: Every day | ORAL | Status: AC

## 2023-04-11 MED ORDER — DOCUSATE SODIUM 100 MG PO CAPS: 100.0000 mg | ORAL_CAPSULE | Freq: Two times a day (BID) | ORAL | 1 refills | Status: AC

## 2023-04-11 MED ORDER — RELISTOR 150 MG PO TABS: 150.0000 mg | ORAL_TABLET | Freq: Every day | ORAL | 1 refills | Status: DC

## 2023-04-11 NOTE — Discharge Instructions (Signed)

## 2023-04-11 NOTE — Progress Notes (Signed)
[facility] ROUNDING NOTE   Subjective: No acute events overnight.  Tolerating p.o. intake without issue.  Having bowel movements.  Seems to be doing well since initiating Relistor.   Objective: Vital signs in last 24 hours: Temp:  [97.3 F (36.3 C)-98.4 F (36.9 C)] 98.4 F (36.9 C) (05/04 0830) Pulse Rate:  [74-81] 74 (05/04 0830) Resp:  [16-18] 17 (05/04 0830) BP: (141-164)/(66-74) 145/66 (05/04 0830) SpO2:  [96 %-100 %] 96 % (05/04 0830) Last BM Date : 04/10/23 General: NAD Abdomen: Soft, mildly distended but improved from exam yesterday.  Nontender.     Intake/Output from previous day: 05/03 0701 - 05/04 0700 In: -  Out: 1100 [Urine:1100] Intake/Output this shift: No intake/output data recorded.   Lab Results: Recent Labs    04/09/23 0450  WBC 9.8  HGB 10.0*  PLT 260  MCV 93.3   BMET Recent Labs    04/09/23 0450 04/10/23 0441 04/11/23 0100  NA 134* 136 134*  K 3.9 4.7 4.2  CL 104 105 101  CO2 22 24 24   GLUCOSE 204* 148* 145*  BUN 31* 30* 29*  CREATININE 1.96* 2.05* 2.02*  CALCIUM 8.6* 8.8* 8.9   LFT No results for input(s): "PROT", "ALBUMIN", "AST", "ALT", "ALKPHOS", "BILITOT", "BILIDIR", "IBILI" in the last 72 hours. PT/INR No results for input(s): "INR" in the last 72 hours.    Imaging/Other results: DG Abd 2 Views  Result Date: 04/10/2023 CLINICAL DATA:  Ileus. EXAM: ABDOMEN - 2 VIEW COMPARISON:  Multiple abdominal radiographs, most recently done 04/09/2023. Abdominopelvic CT 04/01/2023. FINDINGS: Moderate gaseous distention of the sigmoid colon is unchanged from yesterday's radiographs and slightly improved from the CT of last week. There is no evidence of bowel wall thickening or pneumoperitoneum. No significant small bowel distention identified. There is a small caliber catheter projecting over the sacrum which may be within the distal colon. No acute osseous findings are evident. IMPRESSION: Persistent moderate gaseous  distention of the sigmoid colon, similar to yesterday's radiographs and slightly improved from last week. Electronically Signed   By: Carey Bullocks M.D.   On: 04/10/2023 08:58   DG Abd 2 Views  Result Date: 04/09/2023 CLINICAL DATA:  Ileus is EXAM: ABDOMEN - 2 VIEW COMPARISON:  04/08/2023 FINDINGS: Catheter projects over RIGHT mid abdomen and pelvis. Air-filled large and small bowel loops with gaseous distention of sigmoid loop slightly increased from previous exam. No bowel wall thickening or free air. Degenerative changes lumbar spine. No urinary tract calcifications. IMPRESSION: Gaseous distention of sigmoid colon slightly increased from previous exam. Electronically Signed   By: Ulyses Southward M.D.   On: 04/09/2023 10:32      Assessment and Plan:  1) Ogilvie's Has improved clinically since sigmoidoscopy with rectal decompression tube, initiating Relistor, and continuing on laxatives.  Rectal tube on abdominal x-ray yesterday looks to be coiled in the rectum and no longer in the sigmoid colon.  Otherwise continues to improve clinically. - Ok for nursing staff to pull the rectal tube out - Continuing Relistor.  As outpatient, recommend continuing with 150 mg daily p.o. (renal dosing) - Continue MiraLAX - Ok for discharge from a GI standpoint - Will make arrangements for follow-up in the GI clinic    Shellia Cleverly, DO  04/11/2023, 9:23 AM [facility] Pager [phone]

## 2023-04-11 NOTE — Discharge Summary (Signed)
Physician Discharge Summary  Terry Gegg Sr. ZOX:[id number] DOB: Jun 10, 1956  PCP: Loura Back, NP  Admitted from: Home Discharged to: Home  Admit date: 03/31/2023 Discharge date: 04/11/2023  Recommendations for Outpatient Follow-up:    Follow-up Information     Loura Back, NP. Schedule an appointment as soon as possible for a visit in 1 week(s).   Specialty: Nurse Practitioner Why: To be seen with repeat labs (CBC & BMP). Contact information: [address] [place] [address] [phone]         Doristine Locks V, DO. Schedule an appointment as soon as possible for a visit.   Specialty: Gastroenterology Why: MDs office will arrange follow-up in the office and the preauthorization if needed for one of the medications i.e. Relistor that was sent to your pharmacy. Contact information: [address] [place] [address] [phone]                  Home Health: None    Equipment/Devices: None    Discharge Condition: Improved and stable.   Code Status: Full Code Diet recommendation:  Discharge Diet Orders (From admission, onward)     Start     Ordered   04/11/23 0000  Diet Carb Modified        04/11/23 1451   04/03/23 0000  Diet - low sodium heart healthy        04/03/23 1155             Discharge Diagnoses:  Principal Problem:   Ogilvie's syndrome Active Problems:   Essential hypertension   Hyperlipidemia   Hypothyroidism   Stage 3b chronic kidney disease (CKD) (HCC)   Abnormality of rectum   Abnormal CT of the abdomen   Abdominal distension   Brief Summary: 67 year old male who is paraplegic, CVA, peripheral vascular disease, CKD stage III b and hypertension who presented to the ED with abdominal distention.  He is suspected to have Ogilvie syndrome. On 4/25 he underwent a flex sig which showed a dilated colon above the anus that was decompressed.  There was a polyp in the distal rectum that was not removed. Discharge was planned on  4/27 however abdomen became distended again and therefore discharge was canceled. 4/30 He underwent a flex sigmoidoscopy again and decompression was again performed with placement of decompression tube.  Over the next several days, GI assisted with management, bowel regimen adjusted, serial KUBs obtained.  With this he improved.   Assessment and Plan: Principal Problem:   Ogilvie's syndrome [facility] assisted with management.  History as noted above.  After repeat flexible sigmoidoscopy and placement of decompression tube on 4/30, bowel regimen was adjusted i.e. MiraLAX, Colace and did get 2 doses of Relistor.  Serial KUBs were monitored for improvement.  With these measures, patient made gradual improvement.  Discussed with Dr. Barron Alvine today.  He recommends DC on MiraLAX and oral Relistor.  His office will arrange outpatient follow-up and preauthorization for Relistor.  This has been communicated with patient's son at time of discharge. TSH 4/23: 1.972.   Active Problems:   Essential hypertension PTA on amlodipine 5 Mg daily.  Hydralazine 25 Mg 3 times daily was added here.  However to minimize polypharmacy, at time of discharge, increased amlodipine to 10 Mg daily and discontinued hydralazine.  Blood pressures mildly uncontrolled at times.   Hyperlipidemia Continue atorvastatin.     Hypothyroidism -Synthroid to continue   Acute kidney injury on stage 3b chronic kidney disease (CKD) (HCC) Baseline creatinine not  entirely clear but may be in the 1.7-1.9 range.  Creatinine had peaked to 2.4 on 4/29.  Clinically euvolemic.  Presented with creatinine of 1.91 which had then peaked to 2.4 but has since improved and currently down to 2 over the last 2 days.  Stable and almost back to baseline.  Follow BMP closely as outpatient.  If not already seeing nephrology, could consider outpatient nephrology consultation.   Rectal polyp - Noted on flex sig Can be followed up as outpatient   UTI and  possible proctitis Urine culture grew out Providencia stuartii Has been treated with a week of antibiotics.  Off of antibiotics since 4/30.  Personally called patient pharmacy to cancel ciprofloxacin prescription that had been called on 4/27 but per pharmacist, family picked up the prescription the same day.  Left instructions for patient/family to discontinue and not take the ciprofloxacin at home.   Hypokalemia and hypomagnesemia - Patient on scheduled potassium supplements 40 mEq twice daily PTA and in the hospital until yesterday.  Serum potassium was gradually creeping up and given his CKD, did not want to risk hyperkalemia and thereby reduced potassium to 40 mEq once daily with close outpatient follow-up of BMP.   Newly diagnosed type II DM with hyperglycemia: Last A1c 03/30/2022: 5.2.  Hemoglobin A1c 5/2: 7.1.  In the hospital treated with very sensitive SSI.  As per discussion with son, he used to be a diabetic in the past but was able to come off of meds.  Advised dietary restrictions and close outpatient follow-up with PCP for further management.  Hopefully may not need meds and can control on diet alone.  Normocytic anemia Stable.  Suspected due to CKD but other etiologies possible.  Outpatient follow-up.   Consultations: [facility]  Procedures: As noted above   Discharge Instructions  Discharge Instructions     Call MD for:  difficulty breathing, headache or visual disturbances   Complete by: As directed    Call MD for:  extreme fatigue   Complete by: As directed    Call MD for:  persistant dizziness or light-headedness   Complete by: As directed    Call MD for:  persistant nausea and vomiting   Complete by: As directed    Call MD for:  severe uncontrolled pain   Complete by: As directed    Call MD for:  temperature >100.4   Complete by: As directed    Diet - low sodium heart healthy   Complete by: As directed    Diet Carb Modified   Complete by: As directed     Discharge instructions   Complete by: As directed    Do not take the antibiotic i.e. ciprofloxacin that you picked up from the pharmacy on 04/04/2023.  This is because you have already completed the course of antibiotics while you were in the hospital.   Increase activity slowly   Complete by: As directed         Medication List     TAKE these medications    allopurinol 100 MG tablet Commonly known as: ZYLOPRIM Take 100 mg by mouth daily.   amLODipine 10 MG tablet Commonly known as: NORVASC Take 1 tablet (10 mg total) by mouth daily. What changed:  medication strength how much to take   atorvastatin 20 MG tablet Commonly known as: LIPITOR Take 20 mg by mouth at bedtime.   bisacodyl 5 MG EC tablet Commonly known as: DULCOLAX Take 1 tablet (5 mg total) by mouth daily  as needed for moderate constipation.   docusate sodium 100 MG capsule Commonly known as: COLACE Take 1 capsule (100 mg total) by mouth 2 (two) times daily.   levothyroxine 50 MCG tablet Commonly known as: SYNTHROID Take 1 tablet (50 mcg total) by mouth daily before breakfast. What changed: when to take this   oxyCODONE 5 MG immediate release tablet Commonly known as: Oxy IR/ROXICODONE Take 1 tablet (5 mg total) by mouth 3 (three) times daily as needed for moderate pain or severe pain.   polyethylene glycol 17 g packet Commonly known as: MIRALAX / GLYCOLAX Take 17 g by mouth 2 (two) times daily.   potassium chloride SA 20 MEQ tablet Commonly known as: KLOR-CON M Take 2 tablets (40 mEq total) by mouth daily. What changed: when to take this   Relistor 150 MG Tabs Generic drug: Methylnaltrexone Bromide Take 1 tablet (150 mg total) by mouth daily.   sodium bicarbonate 650 MG tablet Take 2 tablets (1,300 mg total) by mouth 3 (three) times daily.   traZODone 50 MG tablet Commonly known as: DESYREL Take 1 tablet (50 mg total) by mouth at bedtime as needed for sleep.       No Known  Allergies    Procedures/Studies: DG Abd 2 Views  Result Date: 04/10/2023 CLINICAL DATA:  Ileus. EXAM: ABDOMEN - 2 VIEW COMPARISON:  Multiple abdominal radiographs, most recently done 04/09/2023. Abdominopelvic CT 04/01/2023. FINDINGS: Moderate gaseous distention of the sigmoid colon is unchanged from yesterday's radiographs and slightly improved from the CT of last week. There is no evidence of bowel wall thickening or pneumoperitoneum. No significant small bowel distention identified. There is a small caliber catheter projecting over the sacrum which may be within the distal colon. No acute osseous findings are evident. IMPRESSION: Persistent moderate gaseous distention of the sigmoid colon, similar to yesterday's radiographs and slightly improved from last week. Electronically Signed   By: Carey Bullocks M.D.   On: 04/10/2023 08:58   DG Abd 2 Views  Result Date: 04/09/2023 CLINICAL DATA:  Ileus is EXAM: ABDOMEN - 2 VIEW COMPARISON:  04/08/2023 FINDINGS: Catheter projects over RIGHT mid abdomen and pelvis. Air-filled large and small bowel loops with gaseous distention of sigmoid loop slightly increased from previous exam. No bowel wall thickening or free air. Degenerative changes lumbar spine. No urinary tract calcifications. IMPRESSION: Gaseous distention of sigmoid colon slightly increased from previous exam. Electronically Signed   By: Ulyses Southward M.D.   On: 04/09/2023 10:32   DG Abd 2 Views  Result Date: 04/08/2023 CLINICAL DATA:  Ileus EXAM: ABDOMEN - 2 VIEW COMPARISON:  04/07/2023 FINDINGS: Persistently dilated loops of colon measuring approximately 10 cm in diameter (previously measured up to 16 cm). No abnormally dilated loops of small bowel are evident. No free intraperitoneal air is seen on decubitus view. Decompression tube is position within the distended sigmoid colon. IMPRESSION: Improving large bowel distension. Decompression tube is position within the sigmoid colon. Electronically  Signed   By: Duanne Guess D.O.   On: 04/08/2023 12:11   DG Abd 2 Views  Result Date: 04/07/2023 CLINICAL DATA:  Generalized abdominal pain EXAM: ABDOMEN - 2 VIEW COMPARISON:  X-ray 04/06/2023 FINDINGS: Once again there is severe dilatation of the large bowel in the central abdomen, sigmoid colon. Previous dimension of 17.1 cm and today estimated at 16.1 cm. Similar. Previously there were also some dilated loops of large bowel elsewhere which appear to be decreasing. The stomach is distended. Nondilated loops of small bowel.  Diffuse degenerative changes of the spine and pelvis with hyperostosis. No obvious free air seen beneath the diaphragm on the upright view. IMPRESSION: Persistent severe dilatation of the sigmoid colon. The other loops of bowel dilatation are improving. No obvious free air. Please correlate with clinical history and prior CT scan of 04/01/2023 Electronically Signed   By: Karen Kays M.D.   On: 04/07/2023 12:29   DG Abd Portable 1V  Result Date: 04/06/2023 CLINICAL DATA:  Paralytic ileus of small intestine and colon. EXAM: PORTABLE ABDOMEN - 1 VIEW COMPARISON:  April 04, 2023. FINDINGS: There is again noted severe dilatation of sigmoid colon, now measuring 17 cm. Mildly dilated air-filled small bowel loops are noted which may represent ileus or possibly obstruction. IMPRESSION: Severe dilatation of sigmoid colon is again noted, now measuring 17 cm which is increased compared to prior exam. Electronically Signed   By: Lupita Raider M.D.   On: 04/06/2023 15:35   DG Abd Portable 1V  Result Date: 04/04/2023 CLINICAL DATA:  [id number] SBO (small bowel obstruction) (HCC) [id number] EXAM: PORTABLE ABDOMEN - 1 VIEW COMPARISON:  CT 04/01/2023 FINDINGS: Colonic distension with gaseous dilatation of the sigmoid colon up to 12 cm as before. Relative paucity of small bowel distension. Spurring throughout the lower thoracic and lumbar spine. No abnormal abdominal calcifications. IMPRESSION: Colonic  distension with gaseous dilatation of the sigmoid colon as before. Electronically Signed   By: Corlis Leak M.D.   On: 04/04/2023 16:53   CT ABDOMEN PELVIS W CONTRAST  Addendum Date: 04/01/2023   ADDENDUM REPORT: 04/01/2023 20:04 ADDENDUM: The sigmoid colon measures up to 12.3 cm in diameter. The transverse colon measures up to 7.9 cm the descending colon measures up to 6.1 cm. The ascending colon is normal in caliber. Electronically Signed   By: Thornell Sartorius M.D.   On: 04/01/2023 20:04   Result Date: 04/01/2023 CLINICAL DATA:  Distended abdomen, pitting edema in bilateral lower extremities. EXAM: CT ABDOMEN AND PELVIS WITH CONTRAST TECHNIQUE: Multidetector CT imaging of the abdomen and pelvis was performed using the standard protocol following bolus administration of intravenous contrast. RADIATION DOSE REDUCTION: This exam was performed according to the departmental dose-optimization program which includes automated exposure control, adjustment of the mA and/or kV according to patient size and/or use of iterative reconstruction technique. CONTRAST:  60mL OMNIPAQUE IOHEXOL 350 MG/ML SOLN COMPARISON:  03/29/2022. FINDINGS: Lower chest: There is a small to moderate right pleural effusion with compressive atelectasis in the right lower lobe. Mild atelectasis or scarring is present at the left lung base. Multi-vessel coronary artery calcifications are noted. Hepatobiliary: No focal abnormality in the liver. No biliary ductal dilation. Stones and hyperdense material are noted in the gallbladder. Pancreas: Unremarkable. No pancreatic ductal dilatation or surrounding inflammatory changes. Spleen: Normal in size without focal abnormality. Adrenals/Urinary Tract: Adrenal glands are within normal limits. Renal atrophy is noted bilaterally. The kidneys enhance symmetrically. There is a cyst in the lower pole of the right kidney. Subcentimeter hypodensity is noted in the left kidney which is too small to further  characterize. No renal calculus or hydronephrosis. The bladder is unremarkable. Stomach/Bowel: There is a small hiatal hernia. The stomach is within normal limits. Multiple distended gas-filled loops of transverse colon, descending colon and sigmoid colon without evidence of obstruction. The appendix is within normal limits. The small bowel is normal in caliber. There is mild rectal wall thickening with perirectal fat stranding. Vascular/Lymphatic: No significant vascular findings are present. No enlarged abdominal or pelvic lymph  nodes. Reproductive: Prostate gland is mildly enlarged. Other: No abdominopelvic ascites. Musculoskeletal: Degenerative changes are present in the thoracolumbar spine. No acute osseous abnormality. IMPRESSION: 1. Multiple distended loops gas-filled transverse, descending, and sigmoid colon without evidence of obstruction. 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 2. Small to moderate right pleural effusion with atelectasis at the lung bases. 3. Cholelithiasis. 4. Small hiatal hernia. 5. Aortic atherosclerosis and coronary artery calcifications. Electronically Signed: By: Thornell Sartorius M.D. On: 04/01/2023 02:38   DG Chest Portable 1 View  Result Date: 04/01/2023 CLINICAL DATA:  Evaluation for fluid retention. EXAM: PORTABLE CHEST 1 VIEW COMPARISON:  March 29, 2022 FINDINGS: The heart size and mediastinal contours are within normal limits. Low lung volumes are noted. There is no evidence of focal consolidation. A small layering right pleural effusion is suspected. No pneumothorax is identified. Air-filled loops of distended bowel are suspected within the visualized portion of the upper abdomen. Multilevel degenerative changes are seen throughout the thoracic spine. IMPRESSION: 1. Low lung volumes with a small layering right pleural effusion. 2. A small bowel obstruction versus ileus cannot be excluded. Further correlation with abdomen pelvis  CT is recommended. Electronically Signed   By: Aram Candela M.D.   On: 04/01/2023 01:07      Subjective: For the last couple of days, has remained without nausea, vomiting or abdominal pain.  Has been tolerating diet.  Over the last 24 hours, patient reports that he has had multiple BMs (although none documented in the intake output section), passing flatus and abdomen feels less distended.  RN confirms that he has had multiple BMs as well.  Denies any other complaints.  Patient reports that he moved from out of town about 3 to 4 years and stays with his son who assists him. Moves around with the help of wheelchair, has diabetic neuropathy in his feet, right upper extremity weakness from prior stroke but denies sensory level.     Discharge Exam:  Vitals:   04/10/23 1559 04/10/23 1932 04/11/23 0404 04/11/23 0830  BP: (!) 164/71 (!) 141/73 (!) 145/74 (!) 145/66  Pulse: 79 81 75 74  Resp: 16 18  17   Temp: (!) 97.3 F (36.3 C) 98.3 F (36.8 C) 98 F (36.7 C) 98.4 F (36.9 C)  TempSrc: Oral Oral Oral Oral  SpO2: 100% 100% 100% 96%  Weight:      Height:        General exam: Middle-age male, moderately built and obese lying comfortably supine in bed without distress. Respiratory system: Clear to auscultation. Respiratory effort normal. Cardiovascular system: S1 & S2 heard, RRR. No JVD, murmurs, rubs, gallops or clicks. No pedal edema.  Not on telemetry. Gastrointestinal system: Abdomen is definitely much better compared to 3 days ago.  Not distended.  Soft.  Tinkling bowel sounds heard which may be chronic and normal for him.  No organomegaly or masses appreciated.  Normal bowel sounds heard. Central nervous system: Alert and oriented. No focal neurological deficits. Extremities: Bilateral lower extremities.  2 x 5 power, right upper extremity graded 3 x 5 power.  Left upper extremity appears to have 4 x 5 power at least. Skin: No rashes, lesions or ulcers Psychiatry: Judgement and  insight appear normal. Mood & affect appropriate.     The results of significant diagnostics from this hospitalization (including imaging, microbiology, ancillary and laboratory) are listed below for reference.     Microbiology: No results found for this or any previous  visit (from the past 240 hour(s)).   Labs: CBC: Recent Labs  Lab 04/09/23 0450  WBC 9.8  HGB 10.0*  HCT 30.4*  MCV 93.3  PLT 260    Basic Metabolic Panel: Recent Labs  Lab 04/05/23 1216 04/06/23 0043 04/07/23 0354 04/09/23 0450 04/10/23 0441 04/11/23 0100  NA 135 134* 135 134* 136 134*  K 3.7 3.4* 3.7 3.9 4.7 4.2  CL 102 102 103 104 105 101  CO2 22 23 21* 22 24 24   GLUCOSE 217* 206* 216* 204* 148* 145*  BUN 24* 29* 40* 31* 30* 29*  CREATININE 2.07* 2.40* 2.34* 1.96* 2.05* 2.02*  CALCIUM 8.3* 8.2* 8.6* 8.6* 8.8* 8.9  MG 1.9 1.8  --  1.6* 1.8 1.9    Liver Function Tests: Recent Labs  Lab 04/05/23 1216  AST 19  ALT 14  ALKPHOS 89  BILITOT 0.4  PROT 6.3*  ALBUMIN 2.7*    CBG: Recent Labs  Lab 04/10/23 1802 04/10/23 2035 04/11/23 0827 04/11/23 1142  GLUCAP 193* 273* 137* 229*    Hgb A1c Recent Labs    04/09/23 0450  HGBA1C 7.1*     Urinalysis    Component Value Date/Time   COLORURINE YELLOW 04/01/2023 0527   APPEARANCEUR HAZY (A) 04/01/2023 0527   LABSPEC 1.012 04/01/2023 0527   PHURINE 5.0 04/01/2023 0527   GLUCOSEU NEGATIVE 04/01/2023 0527   HGBUR NEGATIVE 04/01/2023 0527   BILIRUBINUR NEGATIVE 04/01/2023 0527   KETONESUR NEGATIVE 04/01/2023 0527   PROTEINUR 100 (A) 04/01/2023 0527   NITRITE POSITIVE (A) 04/01/2023 0527   LEUKOCYTESUR MODERATE (A) 04/01/2023 0527    Discussed in detail with patient's son, updated care and answered all questions.  Time coordinating discharge: 35 minutes  SIGNED:  Marcellus Scott, MD,  FACP, FHM, [facility], [facility], [facility]   [facility] [place]     To contact the attending provider between  7A-7P or the covering provider during after hours 7P-7A, please log into the web site [email or web address] and access using universal  password for that web site. If you do not have the password, please call the hospital operator.

## 2023-04-13 ENCOUNTER — Telehealth: Payer: Self-pay

## 2023-04-13 NOTE — Telephone Encounter (Signed)
Will you please initiate Relistor 150 mg p.o. daily for opioid-induced constipation for this patient? Thank you!

## 2023-04-13 NOTE — Telephone Encounter (Signed)
-----   Message from [facility] V, DO sent at 04/11/2023  9:27 AM EDT ----- I expect this patient will be discharged over the weekend.  He is unassigned, initially seen by Dr. Orvan Falconer, so if he wants to follow with [place], I will assume his outpatient care.  Can you please submit for prior authorization for Relistor 150 mg p.o. daily for opioid-induced constipation.  Hopefully he can get discharged with some of it to bridge him until he gets authorization.  Thanks.

## 2023-04-17 ENCOUNTER — Other Ambulatory Visit (HOSPITAL_COMMUNITY): Payer: Self-pay

## 2023-04-17 NOTE — Telephone Encounter (Signed)
I don't see when I looked at patient's medication history.

## 2023-04-17 NOTE — Telephone Encounter (Signed)
Preferred medications under plan are Movantik and Amitiza (lubiprostone), please advise if patient has previously failed or has a contraindication to these alternatives. Thank you!

## 2023-04-20 ENCOUNTER — Telehealth: Payer: Self-pay

## 2023-04-20 ENCOUNTER — Other Ambulatory Visit (HOSPITAL_COMMUNITY): Payer: Self-pay

## 2023-04-20 NOTE — Telephone Encounter (Signed)
PA request received via provider for Relistor 150MG  tablets  PA submitted to [facility] and is pending additional questions/determination via CMM  Key: [id number]  *preferred alternatives of Movantik and Amitzia not tried.

## 2023-04-20 NOTE — Telephone Encounter (Signed)
PA submitted, will be updated in additional encounter created. May not be approved due to non trial of preferred alternatives.

## 2023-04-21 NOTE — Telephone Encounter (Signed)
PA has been DENIED due to:  We denied this request under Medicare Part D because: You asked for the drug above for your Drug induced constipation. This is an off-label use that is not medically accepted. The Medicare rule in the Prescription Drug Benefit Manual (Chapter 6, Section 10.6) says a drug must be used for a medically accepted indication (covered use). Offlabel use is medically accepted when there is proof in one or more of the drug guides that the drug works for your condition. We look at the two major drug guides (compendia): the DRUGDEX Information System and the [facility] Formulary Service Drug Information (AHFS-DI). [employer] has decided that there is no proof in either drug guide that this drug works for your condition. Per Medicare rules, it is not covered.    *diagnosis used K59.03 - Drug induced constipation

## 2023-05-07 ENCOUNTER — Inpatient Hospital Stay (HOSPITAL_COMMUNITY)
Admission: EM | Admit: 2023-05-07 | Discharge: 2023-05-15 | DRG: 345 | Disposition: A | Discharge status: Home / Self Care | Payer: Medicare HMO | Attending: Student | Admitting: Student

## 2023-05-07 ENCOUNTER — Emergency Department (HOSPITAL_COMMUNITY): Payer: Medicare HMO

## 2023-05-07 ENCOUNTER — Other Ambulatory Visit: Payer: Self-pay

## 2023-05-07 DIAGNOSIS — K5981 Ogilvie syndrome: Secondary | ICD-10-CM | POA: Diagnosis not present

## 2023-05-07 DIAGNOSIS — E876 Hypokalemia: Secondary | ICD-10-CM | POA: Diagnosis present

## 2023-05-07 DIAGNOSIS — K6389 Other specified diseases of intestine: Secondary | ICD-10-CM

## 2023-05-07 DIAGNOSIS — E039 Hypothyroidism, unspecified: Secondary | ICD-10-CM | POA: Diagnosis present

## 2023-05-07 DIAGNOSIS — Z7401 Bed confinement status: Secondary | ICD-10-CM

## 2023-05-07 DIAGNOSIS — D631 Anemia in chronic kidney disease: Secondary | ICD-10-CM | POA: Diagnosis present

## 2023-05-07 DIAGNOSIS — I129 Hypertensive chronic kidney disease with stage 1 through stage 4 chronic kidney disease, or unspecified chronic kidney disease: Secondary | ICD-10-CM | POA: Diagnosis present

## 2023-05-07 DIAGNOSIS — K567 Ileus, unspecified: Secondary | ICD-10-CM | POA: Diagnosis present

## 2023-05-07 DIAGNOSIS — G8220 Paraplegia, unspecified: Secondary | ICD-10-CM | POA: Diagnosis present

## 2023-05-07 DIAGNOSIS — Z6833 Body mass index (BMI) 33.0-33.9, adult: Secondary | ICD-10-CM

## 2023-05-07 DIAGNOSIS — E1122 Type 2 diabetes mellitus with diabetic chronic kidney disease: Secondary | ICD-10-CM | POA: Diagnosis present

## 2023-05-07 DIAGNOSIS — Z993 Dependence on wheelchair: Secondary | ICD-10-CM

## 2023-05-07 DIAGNOSIS — K5939 Other megacolon: Secondary | ICD-10-CM | POA: Diagnosis present

## 2023-05-07 DIAGNOSIS — K8020 Calculus of gallbladder without cholecystitis without obstruction: Secondary | ICD-10-CM | POA: Diagnosis present

## 2023-05-07 DIAGNOSIS — E1165 Type 2 diabetes mellitus with hyperglycemia: Secondary | ICD-10-CM | POA: Diagnosis not present

## 2023-05-07 DIAGNOSIS — L89322 Pressure ulcer of left buttock, stage 2: Secondary | ICD-10-CM | POA: Diagnosis present

## 2023-05-07 DIAGNOSIS — K621 Rectal polyp: Secondary | ICD-10-CM | POA: Diagnosis present

## 2023-05-07 DIAGNOSIS — L8990 Pressure ulcer of unspecified site, unspecified stage: Secondary | ICD-10-CM | POA: Insufficient documentation

## 2023-05-07 DIAGNOSIS — E1151 Type 2 diabetes mellitus with diabetic peripheral angiopathy without gangrene: Secondary | ICD-10-CM | POA: Diagnosis present

## 2023-05-07 DIAGNOSIS — N1832 Chronic kidney disease, stage 3b: Secondary | ICD-10-CM | POA: Diagnosis present

## 2023-05-07 DIAGNOSIS — Z79899 Other long term (current) drug therapy: Secondary | ICD-10-CM

## 2023-05-07 DIAGNOSIS — F1120 Opioid dependence, uncomplicated: Secondary | ICD-10-CM | POA: Diagnosis present

## 2023-05-07 DIAGNOSIS — E872 Acidosis, unspecified: Secondary | ICD-10-CM | POA: Diagnosis present

## 2023-05-07 DIAGNOSIS — K562 Volvulus: Secondary | ICD-10-CM

## 2023-05-07 DIAGNOSIS — Z79890 Hormone replacement therapy (postmenopausal): Secondary | ICD-10-CM

## 2023-05-07 DIAGNOSIS — E669 Obesity, unspecified: Secondary | ICD-10-CM | POA: Diagnosis present

## 2023-05-07 DIAGNOSIS — Z8673 Personal history of transient ischemic attack (TIA), and cerebral infarction without residual deficits: Secondary | ICD-10-CM

## 2023-05-07 DIAGNOSIS — Z89422 Acquired absence of other left toe(s): Secondary | ICD-10-CM

## 2023-05-07 DIAGNOSIS — Z7984 Long term (current) use of oral hypoglycemic drugs: Secondary | ICD-10-CM

## 2023-05-07 DIAGNOSIS — E119 Type 2 diabetes mellitus without complications: Secondary | ICD-10-CM

## 2023-05-07 DIAGNOSIS — E785 Hyperlipidemia, unspecified: Secondary | ICD-10-CM | POA: Diagnosis present

## 2023-05-07 DIAGNOSIS — I10 Essential (primary) hypertension: Secondary | ICD-10-CM | POA: Diagnosis present

## 2023-05-07 LAB — CBC WITH DIFFERENTIAL/PLATELET
Abs Immature Granulocytes: 0.11 10*3/uL — ABNORMAL HIGH (ref 0.00–0.07)
Basophils Absolute: 0.1 10*3/uL (ref 0.0–0.1)
Basophils Relative: 0 %
Eosinophils Absolute: 0.3 10*3/uL (ref 0.0–0.5)
Eosinophils Relative: 2 %
HCT: 33.7 % — ABNORMAL LOW (ref 39.0–52.0)
Hemoglobin: 10.8 g/dL — ABNORMAL LOW (ref 13.0–17.0)
Immature Granulocytes: 1 %
Lymphocytes Relative: 17 %
Lymphs Abs: 2.7 10*3/uL (ref 0.7–4.0)
MCH: 30.7 pg (ref 26.0–34.0)
MCHC: 32 g/dL (ref 30.0–36.0)
MCV: 95.7 fL (ref 80.0–100.0)
Monocytes Absolute: 1.3 10*3/uL — ABNORMAL HIGH (ref 0.1–1.0)
Monocytes Relative: 8 %
Neutro Abs: 11.3 10*3/uL — ABNORMAL HIGH (ref 1.7–7.7)
Neutrophils Relative %: 72 %
Platelets: 282 10*3/uL (ref 150–400)
RBC: 3.52 MIL/uL — ABNORMAL LOW (ref 4.22–5.81)
RDW: 15.4 % (ref 11.5–15.5)
WBC: 15.7 10*3/uL — ABNORMAL HIGH (ref 4.0–10.5)
nRBC: 0 % (ref 0.0–0.2)

## 2023-05-07 LAB — COMPREHENSIVE METABOLIC PANEL
ALT: 63 U/L — ABNORMAL HIGH (ref 0–44)
AST: 28 U/L (ref 15–41)
Albumin: 2.9 g/dL — ABNORMAL LOW (ref 3.5–5.0)
Alkaline Phosphatase: 177 U/L — ABNORMAL HIGH (ref 38–126)
Anion gap: 12 (ref 5–15)
BUN: 24 mg/dL — ABNORMAL HIGH (ref 8–23)
CO2: 20 mmol/L — ABNORMAL LOW (ref 22–32)
Calcium: 8.8 mg/dL — ABNORMAL LOW (ref 8.9–10.3)
Chloride: 106 mmol/L (ref 98–111)
Creatinine, Ser: 1.94 mg/dL — ABNORMAL HIGH (ref 0.61–1.24)
GFR, Estimated: 37 mL/min — ABNORMAL LOW (ref >60)
Glucose, Bld: 126 mg/dL — ABNORMAL HIGH (ref 70–99)
Potassium: 3.1 mmol/L — ABNORMAL LOW (ref 3.5–5.1)
Sodium: 138 mmol/L (ref 135–145)
Total Bilirubin: 0.7 mg/dL (ref 0.3–1.2)
Total Protein: 6.7 g/dL (ref 6.5–8.1)

## 2023-05-07 LAB — I-STAT CHEM 8, ED
BUN: 25 mg/dL — ABNORMAL HIGH (ref 8–23)
Calcium, Ion: 1.2 mmol/L (ref 1.15–1.40)
Chloride: 108 mmol/L (ref 98–111)
Creatinine, Ser: 2.1 mg/dL — ABNORMAL HIGH (ref 0.61–1.24)
Glucose, Bld: 123 mg/dL — ABNORMAL HIGH (ref 70–99)
HCT: 33 % — ABNORMAL LOW (ref 39.0–52.0)
Hemoglobin: 11.2 g/dL — ABNORMAL LOW (ref 13.0–17.0)
Potassium: 3.1 mmol/L — ABNORMAL LOW (ref 3.5–5.1)
Sodium: 142 mmol/L (ref 135–145)
TCO2: 22 mmol/L (ref 22–32)

## 2023-05-07 NOTE — ED Provider Notes (Signed)
[place] EMERGENCY DEPARTMENT AT [facility] Provider Note   CSN: [id number] Arrival date & time: 05/07/23  2036     History  Chief Complaint  Patient presents with   Bloated    Tilmon Bosarge Sr. is a 67 y.o. male.  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  He was just recently discharged where he required decompression and rectal tube by gastroenterology.  Started developing abdominal distention a couple days ago coming in today with diffuse distention and pain.  Patient denies any vomiting.  He feels like he is still passing gas but he is "not sure".  Denies any fever.  Home Medications Prior to Admission medications   Medication Sig Start Date End Date Taking? Authorizing Provider  allopurinol (ZYLOPRIM) 100 MG tablet Take 100 mg by mouth daily. 03/02/23   [provider]  amLODipine (NORVASC) 10 MG tablet Take 1 tablet (10 mg total) by mouth daily. 04/11/23   Hongalgi, Maximino Greenland, MD  atorvastatin (LIPITOR) 20 MG tablet Take 20 mg by mouth at bedtime.    [provider]  bisacodyl (DULCOLAX) 5 MG EC tablet Take 1 tablet (5 mg total) by mouth daily as needed for moderate constipation. 04/03/23   Hollice Espy, MD  docusate sodium (COLACE) 100 MG capsule Take 1 capsule (100 mg total) by mouth 2 (two) times daily. 04/11/23   Hongalgi, Maximino Greenland, MD  levothyroxine (SYNTHROID) 50 MCG tablet Take 1 tablet (50 mcg total) by mouth daily before breakfast. 04/11/23   Hongalgi, Maximino Greenland, MD  Methylnaltrexone Bromide (RELISTOR) 150 MG TABS Take 1 tablet (150 mg total) by mouth daily. 04/11/23   Hongalgi, Maximino Greenland, MD  oxyCODONE (OXY IR/ROXICODONE) 5 MG immediate release tablet Take 1 tablet (5 mg total) by mouth 3 (three) times daily as needed for moderate pain or severe pain. 06/08/22   Rolly Salter, MD  polyethylene glycol (MIRALAX / GLYCOLAX) 17 g packet Take 17 g by mouth 2 (two) times daily.  04/11/23   Hongalgi, Maximino Greenland, MD  potassium chloride SA (KLOR-CON M) 20 MEQ tablet Take 2 tablets (40 mEq total) by mouth daily. 04/11/23   Hongalgi, Maximino Greenland, MD  sodium bicarbonate 650 MG tablet Take 2 tablets (1,300 mg total) by mouth 3 (three) times daily. 06/08/22   Rolly Salter, MD  traZODone (DESYREL) 50 MG tablet Take 1 tablet (50 mg total) by mouth at bedtime as needed for sleep. 06/01/20   Leroy Sea, MD      Allergies    Patient has no known allergies.    Review of Systems   Review of Systems  Constitutional:  Negative for fever.  Respiratory:  Negative for shortness of breath.   Cardiovascular:  Negative for chest pain.  Gastrointestinal:  Positive for abdominal distention and abdominal pain. Negative for diarrhea and vomiting.  Skin:  Negative for rash.  Neurological:  Negative for headaches.    Physical Exam Updated Vital Signs BP (!) 147/70   Pulse 66   Temp 98.6 F (37 C) (Oral)   Resp 18   Ht 6' (1.829 m)   Wt 104.3 kg   SpO2 100%   BMI 31.19 kg/m  Physical Exam Vitals and nursing note reviewed.  Constitutional:      Appearance: Normal appearance.     Comments: Very pleasant  HENT:     Head: Normocephalic.     Mouth/Throat:  Mouth: Mucous membranes are moist.  Cardiovascular:     Rate and Rhythm: Normal rate.  Pulmonary:     Effort: Pulmonary effort is normal. No respiratory distress.  Abdominal:     General: There is distension.     Palpations: Abdomen is soft.     Tenderness: There is no abdominal tenderness.     Comments: Abdomen is extremely distended, decreased bowel sounds, diffusely but mildly tender  Skin:    General: Skin is warm.  Neurological:     Mental Status: He is alert and oriented to person, place, and time. Mental status is at baseline.  Psychiatric:        Mood and Affect: Mood normal.     ED Results / Procedures / Treatments   Labs (all labs ordered are listed, but only abnormal results are displayed) Labs Reviewed   CBC WITH DIFFERENTIAL/PLATELET - Abnormal; Notable for the following components:      Result Value   WBC 15.7 (*)    RBC 3.52 (*)    Hemoglobin 10.8 (*)    HCT 33.7 (*)    Neutro Abs 11.3 (*)    Monocytes Absolute 1.3 (*)    Abs Immature Granulocytes 0.11 (*)    All other components within normal limits  COMPREHENSIVE METABOLIC PANEL  I-STAT CHEM 8, ED    EKG None  Radiology DG Abdomen 1 View  Result Date: 05/07/2023 CLINICAL DATA:  Abdominal distension EXAM: ABDOMEN - 1 VIEW COMPARISON:  04/10/2023 FINDINGS: There is marked gaseous distension of the colon with the distal sigmoid colon measuring up to 18 cm in diameter. While this has a similar appearance to prior CT examination of 04/01/2023, the degree of sigmoid distension appears asymmetric to the remainder of the colon and an underlying sigmoid volvulus is not excluded. No free intraperitoneal gas. Degenerative changes are seen within the lumbar spine. IMPRESSION: 1. Marked gaseous distension of the colon with the distal sigmoid colon measuring up to 18 cm in diameter. While this has a similar appearance to prior CT examination of 04/01/2023, an underlying sigmoid volvulus is not excluded. Repeat CT imaging would be helpful if there is clinical suspicion of underlying bowel volvulus. Electronically Signed   By: Helyn Numbers M.D.   On: 05/07/2023 22:44    Procedures Ultrasound ED Peripheral IV (Provider)  Date/Time: 05/07/2023 11:19 PM  Performed by: Rozelle Logan, DO Authorized by: Rozelle Logan, DO   Procedure details:    Indications: multiple failed IV attempts and poor IV access     Location:  Left AC   Angiocath:  18 G   Bedside Ultrasound Guided: Yes     Images: not archived     Patient tolerated procedure without complications: Yes       Medications Ordered in ED Medications - No data to display  ED Course/ Medical Decision Making/ A&P                             Medical Decision Making Amount  and/or Complexity of Data Reviewed Labs: ordered. Radiology: ordered.   67 year old male presents emergency department abdominal distention.  History of Ogilvie syndrome, requiring admission and decompression by GI in the past.  Vital signs are stable.  Abdomen is diffusely distended, decreased bowel sounds.  The patient feels that he is still passing gas, denies any vomiting.  Patient required ultrasound-guided IV.  KUB shows colonic distention but new findings concerning for possible  sigmoid volvulus.  For this reason we will plan for CT imaging.  Regardless patient will require admission.  Patient signed out pending CT imaging.        Final Clinical Impression(s) / ED Diagnoses Final diagnoses:  None    Rx / DC Orders ED Discharge Orders     None         Rozelle Logan, DO 05/07/23 2320

## 2023-05-07 NOTE — ED Triage Notes (Signed)
Pt BIB EMS from home d/t abdominal distention, started 2 days ago.  Hx of similar episode 2 wks ago . Hx of stroke, bed bound at baseline EMS VS: 200/98, HR: 76, cbg 108

## 2023-05-08 DIAGNOSIS — E669 Obesity, unspecified: Secondary | ICD-10-CM | POA: Diagnosis present

## 2023-05-08 DIAGNOSIS — Z993 Dependence on wheelchair: Secondary | ICD-10-CM | POA: Diagnosis not present

## 2023-05-08 DIAGNOSIS — R7989 Other specified abnormal findings of blood chemistry: Secondary | ICD-10-CM

## 2023-05-08 DIAGNOSIS — I10 Essential (primary) hypertension: Secondary | ICD-10-CM | POA: Diagnosis not present

## 2023-05-08 DIAGNOSIS — K5981 Ogilvie syndrome: Principal | ICD-10-CM

## 2023-05-08 DIAGNOSIS — E1122 Type 2 diabetes mellitus with diabetic chronic kidney disease: Secondary | ICD-10-CM | POA: Diagnosis present

## 2023-05-08 DIAGNOSIS — Z7984 Long term (current) use of oral hypoglycemic drugs: Secondary | ICD-10-CM | POA: Diagnosis not present

## 2023-05-08 DIAGNOSIS — E039 Hypothyroidism, unspecified: Secondary | ICD-10-CM | POA: Diagnosis present

## 2023-05-08 DIAGNOSIS — E119 Type 2 diabetes mellitus without complications: Secondary | ICD-10-CM | POA: Diagnosis not present

## 2023-05-08 DIAGNOSIS — E785 Hyperlipidemia, unspecified: Secondary | ICD-10-CM | POA: Diagnosis present

## 2023-05-08 DIAGNOSIS — E1129 Type 2 diabetes mellitus with other diabetic kidney complication: Secondary | ICD-10-CM | POA: Diagnosis not present

## 2023-05-08 DIAGNOSIS — Z7401 Bed confinement status: Secondary | ICD-10-CM | POA: Diagnosis not present

## 2023-05-08 DIAGNOSIS — E872 Acidosis, unspecified: Secondary | ICD-10-CM | POA: Diagnosis present

## 2023-05-08 DIAGNOSIS — Z79890 Hormone replacement therapy (postmenopausal): Secondary | ICD-10-CM | POA: Diagnosis not present

## 2023-05-08 DIAGNOSIS — E1165 Type 2 diabetes mellitus with hyperglycemia: Secondary | ICD-10-CM | POA: Diagnosis not present

## 2023-05-08 DIAGNOSIS — I495 Sick sinus syndrome: Secondary | ICD-10-CM | POA: Diagnosis not present

## 2023-05-08 DIAGNOSIS — D638 Anemia in other chronic diseases classified elsewhere: Secondary | ICD-10-CM

## 2023-05-08 DIAGNOSIS — L89322 Pressure ulcer of left buttock, stage 2: Secondary | ICD-10-CM | POA: Diagnosis present

## 2023-05-08 DIAGNOSIS — K6389 Other specified diseases of intestine: Secondary | ICD-10-CM | POA: Diagnosis not present

## 2023-05-08 DIAGNOSIS — E875 Hyperkalemia: Secondary | ICD-10-CM | POA: Diagnosis not present

## 2023-05-08 DIAGNOSIS — K567 Ileus, unspecified: Secondary | ICD-10-CM | POA: Diagnosis present

## 2023-05-08 DIAGNOSIS — E1151 Type 2 diabetes mellitus with diabetic peripheral angiopathy without gangrene: Secondary | ICD-10-CM | POA: Diagnosis present

## 2023-05-08 DIAGNOSIS — K5939 Other megacolon: Secondary | ICD-10-CM | POA: Diagnosis not present

## 2023-05-08 DIAGNOSIS — G8220 Paraplegia, unspecified: Secondary | ICD-10-CM | POA: Diagnosis present

## 2023-05-08 DIAGNOSIS — Z6833 Body mass index (BMI) 33.0-33.9, adult: Secondary | ICD-10-CM | POA: Diagnosis not present

## 2023-05-08 DIAGNOSIS — R140 Abdominal distension (gaseous): Secondary | ICD-10-CM | POA: Diagnosis not present

## 2023-05-08 DIAGNOSIS — D631 Anemia in chronic kidney disease: Secondary | ICD-10-CM | POA: Diagnosis present

## 2023-05-08 DIAGNOSIS — I129 Hypertensive chronic kidney disease with stage 1 through stage 4 chronic kidney disease, or unspecified chronic kidney disease: Secondary | ICD-10-CM | POA: Diagnosis present

## 2023-05-08 DIAGNOSIS — F1120 Opioid dependence, uncomplicated: Secondary | ICD-10-CM | POA: Diagnosis present

## 2023-05-08 DIAGNOSIS — Z79899 Other long term (current) drug therapy: Secondary | ICD-10-CM | POA: Diagnosis not present

## 2023-05-08 DIAGNOSIS — N189 Chronic kidney disease, unspecified: Secondary | ICD-10-CM | POA: Diagnosis not present

## 2023-05-08 DIAGNOSIS — E876 Hypokalemia: Secondary | ICD-10-CM | POA: Diagnosis present

## 2023-05-08 DIAGNOSIS — N1832 Chronic kidney disease, stage 3b: Secondary | ICD-10-CM | POA: Diagnosis present

## 2023-05-08 DIAGNOSIS — K621 Rectal polyp: Secondary | ICD-10-CM | POA: Diagnosis present

## 2023-05-08 LAB — COMPREHENSIVE METABOLIC PANEL
ALT: 59 U/L — ABNORMAL HIGH (ref 0–44)
AST: 22 U/L (ref 15–41)
Albumin: 2.8 g/dL — ABNORMAL LOW (ref 3.5–5.0)
Alkaline Phosphatase: 186 U/L — ABNORMAL HIGH (ref 38–126)
Anion gap: 9 (ref 5–15)
BUN: 22 mg/dL (ref 8–23)
CO2: 20 mmol/L — ABNORMAL LOW (ref 22–32)
Calcium: 8.7 mg/dL — ABNORMAL LOW (ref 8.9–10.3)
Chloride: 110 mmol/L (ref 98–111)
Creatinine, Ser: 1.92 mg/dL — ABNORMAL HIGH (ref 0.61–1.24)
GFR, Estimated: 37 mL/min — ABNORMAL LOW (ref >60)
Glucose, Bld: 108 mg/dL — ABNORMAL HIGH (ref 70–99)
Potassium: 2.9 mmol/L — ABNORMAL LOW (ref 3.5–5.1)
Sodium: 139 mmol/L (ref 135–145)
Total Bilirubin: 0.8 mg/dL (ref 0.3–1.2)
Total Protein: 6.7 g/dL (ref 6.5–8.1)

## 2023-05-08 LAB — CBC WITH DIFFERENTIAL/PLATELET
Abs Immature Granulocytes: 0.12 10*3/uL — ABNORMAL HIGH (ref 0.00–0.07)
Basophils Absolute: 0 10*3/uL (ref 0.0–0.1)
Basophils Relative: 0 %
Eosinophils Absolute: 0.3 10*3/uL (ref 0.0–0.5)
Eosinophils Relative: 2 %
HCT: 32.7 % — ABNORMAL LOW (ref 39.0–52.0)
Hemoglobin: 10.2 g/dL — ABNORMAL LOW (ref 13.0–17.0)
Immature Granulocytes: 1 %
Lymphocytes Relative: 19 %
Lymphs Abs: 2.5 10*3/uL (ref 0.7–4.0)
MCH: 29.5 pg (ref 26.0–34.0)
MCHC: 31.2 g/dL (ref 30.0–36.0)
MCV: 94.5 fL (ref 80.0–100.0)
Monocytes Absolute: 1.1 10*3/uL — ABNORMAL HIGH (ref 0.1–1.0)
Monocytes Relative: 9 %
Neutro Abs: 9.2 10*3/uL — ABNORMAL HIGH (ref 1.7–7.7)
Neutrophils Relative %: 69 %
Platelets: 256 10*3/uL (ref 150–400)
RBC: 3.46 MIL/uL — ABNORMAL LOW (ref 4.22–5.81)
RDW: 15.2 % (ref 11.5–15.5)
WBC: 13.3 10*3/uL — ABNORMAL HIGH (ref 4.0–10.5)
nRBC: 0 % (ref 0.0–0.2)

## 2023-05-08 LAB — BASIC METABOLIC PANEL
Anion gap: 9 (ref 5–15)
BUN: 22 mg/dL (ref 8–23)
CO2: 19 mmol/L — ABNORMAL LOW (ref 22–32)
Calcium: 8.5 mg/dL — ABNORMAL LOW (ref 8.9–10.3)
Chloride: 107 mmol/L (ref 98–111)
Creatinine, Ser: 1.82 mg/dL — ABNORMAL HIGH (ref 0.61–1.24)
GFR, Estimated: 40 mL/min — ABNORMAL LOW (ref >60)
Glucose, Bld: 93 mg/dL (ref 70–99)
Potassium: 2.8 mmol/L — ABNORMAL LOW (ref 3.5–5.1)
Sodium: 135 mmol/L (ref 135–145)

## 2023-05-08 LAB — MAGNESIUM: Magnesium: 1.8 mg/dL (ref 1.7–2.4)

## 2023-05-08 MED ORDER — ACETAMINOPHEN 325 MG PO TABS: 650.0000 mg | ORAL_TABLET | Freq: Four times a day (QID) | ORAL | Status: DC | PRN

## 2023-05-08 MED ORDER — BISACODYL 10 MG RE SUPP
10.0000 mg | Freq: Two times a day (BID) | RECTAL | Status: DC
  Administered 2023-05-08 (×2): 10 mg via RECTAL
  Filled 2023-05-08 (×5): qty 1

## 2023-05-08 MED ORDER — NALOXONE HCL 0.4 MG/ML IJ SOLN: 0.4000 mg | INTRAMUSCULAR | Status: DC | PRN

## 2023-05-08 MED ORDER — LACTATED RINGERS IV SOLN: INTRAVENOUS | Status: AC

## 2023-05-08 MED ORDER — ATORVASTATIN CALCIUM 10 MG PO TABS: 20.0000 mg | ORAL_TABLET | Freq: Every day | ORAL | Status: DC

## 2023-05-08 MED ORDER — FENTANYL CITRATE PF 50 MCG/ML IJ SOSY: 25.0000 ug | PREFILLED_SYRINGE | INTRAMUSCULAR | Status: DC | PRN

## 2023-05-08 MED ORDER — ACETAMINOPHEN 650 MG RE SUPP: 650.0000 mg | Freq: Four times a day (QID) | RECTAL | Status: DC | PRN

## 2023-05-08 MED ORDER — POTASSIUM CHLORIDE 10 MEQ/100ML IV SOLN
10.0000 meq | INTRAVENOUS | Status: AC
  Administered 2023-05-08 (×4): 10 meq via INTRAVENOUS
  Filled 2023-05-08 (×2): qty 100

## 2023-05-08 MED ORDER — POTASSIUM CHLORIDE 10 MEQ/100ML IV SOLN
10.0000 meq | INTRAVENOUS | Status: AC
  Administered 2023-05-08 (×2): 10 meq via INTRAVENOUS
  Filled 2023-05-08 (×4): qty 100

## 2023-05-08 MED ORDER — LEVOTHYROXINE SODIUM 50 MCG PO TABS
50.0000 ug | ORAL_TABLET | Freq: Every day | ORAL | Status: DC
  Administered 2023-05-09 – 2023-05-15 (×7): 50 ug via ORAL
  Filled 2023-05-08 (×7): qty 1

## 2023-05-08 MED ORDER — HYDRALAZINE HCL 20 MG/ML IJ SOLN
10.0000 mg | Freq: Four times a day (QID) | INTRAMUSCULAR | Status: DC | PRN
  Administered 2023-05-08: 10 mg via INTRAVENOUS
  Filled 2023-05-08: qty 1

## 2023-05-08 MED ORDER — METHYLNALTREXONE BROMIDE 12 MG/0.6ML ~~LOC~~ SOLN
6.0000 mg | SUBCUTANEOUS | Status: AC
  Administered 2023-05-08 – 2023-05-09 (×2): 6 mg via SUBCUTANEOUS
  Filled 2023-05-08 (×2): qty 0.6

## 2023-05-08 MED ORDER — POTASSIUM CHLORIDE 10 MEQ/100ML IV SOLN
10.0000 meq | INTRAVENOUS | Status: AC
  Administered 2023-05-08 (×2): 10 meq via INTRAVENOUS
  Filled 2023-05-08 (×2): qty 100

## 2023-05-08 MED ORDER — AMLODIPINE BESYLATE 10 MG PO TABS
10.0000 mg | ORAL_TABLET | Freq: Every day | ORAL | Status: DC
  Administered 2023-05-09 – 2023-05-12 (×4): 10 mg via ORAL
  Filled 2023-05-08 (×5): qty 1

## 2023-05-08 MED ORDER — ONDANSETRON HCL 4 MG/2ML IJ SOLN: 4.0000 mg | Freq: Four times a day (QID) | INTRAMUSCULAR | Status: DC | PRN

## 2023-05-08 MED ORDER — MAGNESIUM SULFATE 2 GM/50ML IV SOLN
2.0000 g | Freq: Once | INTRAVENOUS | Status: AC
  Administered 2023-05-08: 2 g via INTRAVENOUS
  Filled 2023-05-08: qty 50

## 2023-05-08 NOTE — Progress Notes (Signed)
BRIEF PHARMACY MONITORING NOTE  Terry Barton is a 67 yo male admitted for abdominal distension. GI following for recurrent Ogilvie's Syndrome. Pharmacy consulted to dose Relistor while inpatient. D/w GI - plan for a dose today and tomorrow (6/1) and will re-evaluate need going forward.  Plan: Relistor 6mg  SQ x2 doses (dosed for CrCl < 60 mL/min) F/u need for further doses 6/2  Rexford Maus, PharmD, BCPS 05/08/2023 12:21 PM

## 2023-05-08 NOTE — Plan of Care (Signed)

## 2023-05-08 NOTE — Consult Note (Signed)
Referring Provider: Dr. Cy Blamer Primary Care Physician:  Loura Back, NP Primary Gastroenterologist:  Gentry Fitz   Reason for Consultation:  Recurrent Ogilvie's Syndrome please consult  HPI: Chueyee Koeppel Sr. is a 67 y.o. male with a past medical history of intracranial hemorrhage secondary to hypertension 2021, spinal cord injury with paraplegia 2016, hypothyroidism, diabetes mellitus type 2, CKD stage IIIb, CVA 2021 and PVD s/p multiple toe amputations.  He was admitted to the hospital 4/23 - 04/11/2023 with significant abdominal distention.  CTAP identified multiple distended loops to the transverse, descending and sigmoid colon without evidence of obstruction and mild rectal wall thickening and perirectal fat stranding suggestive of proctitis.  CT findings concerning for Ogilvie syndrome. Hypokalemia and hypomagnesia were treated.  He underwent a sigmoidoscopy 04/02/2023 which showed a dilated colon above the anus which was decompressed and a 10 mm polyp was identified in the distal rectum which was not resected.  He continued to have significant abdominal distention and underwent a second sigmoidoscopy 04/07/2023 with placement of rectal tube then the colon was fully desufflated with the colonoscope. His abdominal distention continue to improve with after receiving Miralax, Colace and 2 doses of Relisor and he was discharged home 04/11/2023.  He was prescribed Relistor 150mg  po QD but this medications was denied by Medicare. A colonoscopy with rectal polypectomy was planned as an outpatient.   He had recurrence of abdominal distension progressively worsened x 2 days therefore he presented to the ED 05/07/2023 for further evaluation.  Labs in the ED showed a WBC count of 15.7.  Hemoglobin 10.8 ( Hg 10 on 04/09/2023).  Platelet 282.  Potassium 3.1.  BUN 24.  Creatinine 1.94.  Albumin 2.9.  Total bili 0.7.  Alk phos 177.  AST 28.  ALT 63.  Abdominal x-ray showed marked gaseous distention of the colon  with the distal sigmoid colon measuring up to 18 cm in diameter discerning for sigmoid volvulus.  CTAP without contrast showed diffuse gaseous distention of the colon consistent with a colonic ileus and the sigmoid colon was partially distended measuring up to 13.5 cm without superimposed volvulus.  Labs today: WBC 13.3.  Hemoglobin 10.2.  Potassium 2.9.  Magnesium 1.8.  Creatinine 1.92.  Total bili 0.8.  Alk phos 186.  AST 22.  ALT 59.   A GI consult was requested for further evaluation regarding recurrent nominal distention, likely Ogilvie's syndrome.  He endorsed feeling well following his hospital discharged 5/4. Appetite was good and he was passing several loose nonbloody stools daily which is his normal bowel pattern. I spoke to his son, [name], who confirmed this as well.  He was taking Colace twice daily and MiraLAX as needed.  He developed recurrent abdominal distention 3 to 4 days ago which progressively worsened.  He denies having any nausea or vomiting.  No abdominal pain.  No bowel movement since admission.  He is unsure if he has passed any gas per the rectum. At home, he transfers from the bed to his wheelchair with assistance. He sits up in the chair for at least 7 hours then goes back into the bed.   GI PROCEDURES:  Flexible sigmoidoscopy as an inpatient by Dr. Orvan Falconer 04/02/2023: - One 10 mm polyp in the distal rectum. This was not resected today.  - Dilated colon above the anus. No mucosal abnormalities seen. Decompression performed.   Flexible sigmoidoscopy as an inpatient by Dr. Barron Alvine 04/07/2023: - Dilated sigmoid colon and distal descending colon. Rectal decompression tube was placed,  then the colon was fully desuflated with the colonoscope. - Stool in the rectum and in the transverse colon. - No specimens collected.  Past Medical History:  Diagnosis Date   Anemia    Bedbound    uses wheel chair   Chronic kidney disease    stage 3   Diabetes mellitus without  complication (HCC)    no meds   HLD (hyperlipidemia)    Hypertension    Hypothyroidism    Intracranial bleeding (HCC)    Intracranial bleeding (HCC)    Paraplegia (HCC)    can move L arm only   Stroke [facility]) 2021    Past Surgical History:  Procedure Laterality Date   AMPUTATION TOE Left 06/03/2022   Procedure: AMPUTATION TOE;  Surgeon: Vivi Barrack, DPM;  Location: [facility] OR;  Service: Podiatry;  Laterality: Left;   BOWEL DECOMPRESSION N/A 04/07/2023   Procedure: BOWEL DECOMPRESSION;  Surgeon: Shellia Cleverly, DO;  Location: [facility] ENDOSCOPY;  Service: Gastroenterology;  Laterality: N/A;   COLONOSCOPY     FLEXIBLE SIGMOIDOSCOPY N/A 04/02/2023   Procedure: FLEXIBLE SIGMOIDOSCOPY;  Surgeon: Tressia [name], MD;  Location: [facility] ENDOSCOPY;  Service: Gastroenterology;  Laterality: N/A;   FLEXIBLE SIGMOIDOSCOPY N/A 04/07/2023   Procedure: FLEXIBLE SIGMOIDOSCOPY;  Surgeon: Shellia Cleverly, DO;  Location: [facility] ENDOSCOPY;  Service: Gastroenterology;  Laterality: N/A;   TOE AMPUTATION Bilateral    x 3,   2 on left and 1 on right    Prior to Admission medications   Medication Sig Start Date End Date Taking? Authorizing Provider  allopurinol (ZYLOPRIM) 100 MG tablet Take 100 mg by mouth daily. 03/02/23   [provider]  amLODipine (NORVASC) 10 MG tablet Take 1 tablet (10 mg total) by mouth daily. 04/11/23   Hongalgi, Maximino Greenland, MD  atorvastatin (LIPITOR) 20 MG tablet Take 20 mg by mouth at bedtime.    [provider]  bisacodyl (DULCOLAX) 5 MG EC tablet Take 1 tablet (5 mg total) by mouth daily as needed for moderate constipation. 04/03/23   Hollice Espy, MD  docusate sodium (COLACE) 100 MG capsule Take 1 capsule (100 mg total) by mouth 2 (two) times daily. 04/11/23   Hongalgi, Maximino Greenland, MD  levothyroxine (SYNTHROID) 50 MCG tablet Take 1 tablet (50 mcg total) by mouth daily before breakfast. 04/11/23   Hongalgi, Maximino Greenland, MD  Methylnaltrexone Bromide (RELISTOR) 150 MG TABS Take 1  tablet (150 mg total) by mouth daily. 04/11/23   Hongalgi, Maximino Greenland, MD  oxyCODONE (OXY IR/ROXICODONE) 5 MG immediate release tablet Take 1 tablet (5 mg total) by mouth 3 (three) times daily as needed for moderate pain or severe pain. 06/08/22   Rolly Salter, MD  polyethylene glycol (MIRALAX / GLYCOLAX) 17 g packet Take 17 g by mouth 2 (two) times daily. 04/11/23   Hongalgi, Maximino Greenland, MD  potassium chloride SA (KLOR-CON M) 20 MEQ tablet Take 2 tablets (40 mEq total) by mouth daily. 04/11/23   Hongalgi, Maximino Greenland, MD  sodium bicarbonate 650 MG tablet Take 2 tablets (1,300 mg total) by mouth 3 (three) times daily. 06/08/22   Rolly Salter, MD  traZODone (DESYREL) 50 MG tablet Take 1 tablet (50 mg total) by mouth at bedtime as needed for sleep. 06/01/20   Leroy Sea, MD    Current Facility-Administered Medications  Medication Dose Route Frequency Provider Last Rate Last Admin   acetaminophen (TYLENOL) tablet 650 mg  650 mg Oral Q6H PRN Howerter, Justin B, DO  Or   acetaminophen (TYLENOL) suppository 650 mg  650 mg Rectal Q6H PRN Howerter, Justin B, DO       fentaNYL (SUBLIMAZE) injection 25 mcg  25 mcg Intravenous Q2H PRN Howerter, Justin B, DO       lactated ringers infusion   Intravenous Continuous Howerter, Justin B, DO 75 mL/hr at 05/08/23 0114 New Bag at 05/08/23 0114   magnesium sulfate IVPB 2 g 50 mL  2 g Intravenous Once Rolly Salter, MD       naloxone [facility]) injection 0.4 mg  0.4 mg Intravenous PRN Howerter, Justin B, DO       ondansetron (ZOFRAN) injection 4 mg  4 mg Intravenous Q6H PRN Howerter, Justin B, DO       potassium chloride 10 mEq in 100 mL IVPB  10 mEq Intravenous Q1 Hr x 4 Rolly Salter, MD        Allergies as of 05/07/2023   (No Known Allergies)    Family History  Family history unknown: Yes    Social History   Socioeconomic History   Marital status: Single    Spouse name: Not on file   Number of children: Not on file   Years of education: Not on file    Highest education level: Not on file  Occupational History   Occupation: disabled  Tobacco Use   Smoking status: Never   Smokeless tobacco: Never  Vaping Use   Vaping Use: Never used  Substance and Sexual Activity   Alcohol use: Not Currently   Drug use: Not Currently   Sexual activity: Not Currently  Other Topics Concern   Not on file  Social History Narrative   Not on file   Social Determinants of Health   Financial Resource Strain: Not on file  Food Insecurity: Not on file  Transportation Needs: Not on file  Physical Activity: Not on file  Stress: Not on file  Social Connections: Not on file  Intimate Partner Violence: Not on file    Review of Systems: Gen: Denies fever, sweats or chills. No weight loss.  CV: Denies chest pain, palpitations or edema. Resp: Denies cough, shortness of breath of hemoptysis.  GI: See HPI. GU : Denies urinary burning, blood in urine, increased urinary frequency or incontinence. MS: + Generalized weakness.  Derm: Denies rash, itchiness, skin lesions or unhealing ulcers. Psych: Denies depression, anxiety, memory loss or confusion. Heme: Denies easy bruising, bleeding. Neuro:  + Paraplegia.  Endo: + DM type II.  Physical Exam: Vital signs in last 24 hours: Temp:  [98.2 F (36.8 C)-98.6 F (37 C)] 98.5 F (36.9 C) (05/31 0335) Pulse Rate:  [62-70] 66 (05/31 0335) Resp:  [14-23] 16 (05/31 0335) BP: (147-165)/(68-85) 165/85 (05/31 0335) SpO2:  [99 %-100 %] 100 % (05/31 0335) Weight:  [104.3 kg-107.6 kg] 107.6 kg (05/31 0335)   General:  Alert 67 year old male in no acute distress. Head:  Normocephalic and atraumatic. Eyes:  No scleral icterus. Conjunctiva pink. Ears:  Normal auditory acuity. Nose:  No deformity, discharge or lesions. Mouth: Upper and lower dentures.  No ulcers or lesions.  Neck:  Supple. No lymphadenopathy or thyromegaly.  Lungs: Breath sounds clear throughout. No wheezes, rhonchi or crackles.  Heart: Distant  S1, S2, regular rate and rhythm, no murmurs. Abdomen: Moderately distended, nontender. Scattered tinkering bowel sounds x 4 quads.  Central abdomen tympanic to percussion, abdominal borders dull to percussion.  Rectal: Deferred. Musculoskeletal:  Symmetrical without gross deformities.  Pulses:  Normal  pulses noted. Extremities: Trace edema. Neurologic:  Alert and  oriented x 4. RUE/RLE weakness > LUE/LLE. Skin:  Intact without significant lesions or rashes. Psych:  Alert and cooperative. Normal mood and affect.  Intake/Output from previous day: No intake/output data recorded. Intake/Output this shift: No intake/output data recorded.  Lab Results: Recent Labs    05/07/23 2217 05/07/23 2325 05/08/23 0216  WBC 15.7*  --  13.3*  HGB 10.8* 11.2* 10.2*  HCT 33.7* 33.0* 32.7*  PLT 282  --  256   BMET Recent Labs    05/07/23 2319 05/07/23 2325 05/08/23 0216  NA 138 142 139  K 3.1* 3.1* 2.9*  CL 106 108 110  CO2 20*  --  20*  GLUCOSE 126* 123* 108*  BUN 24* 25* 22  CREATININE 1.94* 2.10* 1.92*  CALCIUM 8.8*  --  8.7*   LFT Recent Labs    05/08/23 0216  PROT 6.7  ALBUMIN 2.8*  AST 22  ALT 59*  ALKPHOS 186*  BILITOT 0.8   PT/INR No results for input(s): "LABPROT", "INR" in the last 72 hours. Hepatitis Panel No results for input(s): "HEPBSAG", "HCVAB", "HEPAIGM", "HEPBIGM" in the last 72 hours.    Studies/Results: CT Renal Stone Study  Result Date: 05/07/2023 CLINICAL DATA:  Ogilvie syndrome, abdominal/flank pain EXAM: CT ABDOMEN AND PELVIS WITHOUT CONTRAST TECHNIQUE: Multidetector CT imaging of the abdomen and pelvis was performed following the standard protocol without IV contrast. RADIATION DOSE REDUCTION: This exam was performed according to the departmental dose-optimization program which includes automated exposure control, adjustment of the mA and/or kV according to patient size and/or use of iterative reconstruction technique. COMPARISON:  04/01/2023  FINDINGS: Lower chest: Moderate right pleural effusion with compressive atelectasis of the right lower lobe again noted. No acute abnormality. Extensive multi-vessel coronary artery calcification. Hepatobiliary: Cholelithiasis without pericholecystic inflammatory change. Liver unremarkable on this noncontrast examination. No intra or extrahepatic biliary ductal dilation. Pancreas: Unremarkable Spleen: Unremarkable Adrenals/Urinary Tract: The adrenal glands are unremarkable. The kidneys are atrophic bilaterally, unchanged from prior examination. Simple cortical cyst arises from the lower pole the right kidney, unchanged, for which no follow-up imaging is recommended. The kidneys are otherwise unremarkable. The bladder is unremarkable. Stomach/Bowel: There is diffuse gaseous distension of the colon in keeping with a colonic ileus. The sigmoid colon is particularly distended measuring up to 13.5 cm in diameter, however, this appears similar to prior examination and there is no superimposed volvulus identified. The stomach, small bowel, and large bowel are otherwise unremarkable. No focal inflammatory change. Appendix normal. No free intraperitoneal gas or fluid. Vascular/Lymphatic: Aortic atherosclerosis. No enlarged abdominal or pelvic lymph nodes. Reproductive: Prostate is unremarkable. Other: No abdominal wall hernia Musculoskeletal: Degenerative changes are seen within the thoracolumbar spine. No acute bone abnormality. Significant fatty infiltration of the visualized skeletal musculature. IMPRESSION: 1. Diffuse gaseous distension of the colon in keeping with a colonic ileus. The sigmoid colon is particularly distended measuring up to 13.5 cm in diameter, however, this appears similar to prior examination and there is no superimposed volvulus identified. 2. Moderate right pleural effusion with compressive atelectasis of the right lower lobe. 3. Extensive multi-vessel coronary artery calcification. 4.  Cholelithiasis. Aortic Atherosclerosis (ICD10-I70.0). Electronically Signed   By: Helyn Numbers M.D.   On: 05/07/2023 23:54   DG Abdomen 1 View  Result Date: 05/07/2023 CLINICAL DATA:  Abdominal distension EXAM: ABDOMEN - 1 VIEW COMPARISON:  04/10/2023 FINDINGS: There is marked gaseous distension of the colon with the distal sigmoid colon measuring up  to 18 cm in diameter. While this has a similar appearance to prior CT examination of 04/01/2023, the degree of sigmoid distension appears asymmetric to the remainder of the colon and an underlying sigmoid volvulus is not excluded. No free intraperitoneal gas. Degenerative changes are seen within the lumbar spine. IMPRESSION: 1. Marked gaseous distension of the colon with the distal sigmoid colon measuring up to 18 cm in diameter. While this has a similar appearance to prior CT examination of 04/01/2023, an underlying sigmoid volvulus is not excluded. Repeat CT imaging would be helpful if there is clinical suspicion of underlying bowel volvulus. Electronically Signed   By: Helyn Numbers M.D.   On: 05/07/2023 22:44    IMPRESSION/PLAN:  67 year old male with recurrent abdominal distention, Ogilvie syndrome vs ileus. Inactivity likely a contributing factor. CTAP without contrast 05/07/2023 showed diffuse gaseous distention of the colon consistent with a colonic ileus and the sigmoid colon was partially distended measuring up to 13.5 cm without superimposed volvulus. WBC 13.3. Afebrile. On Zosyn IV. Hemodynamically stable. -NPO -Continue LR @ 75cc/hr -Keep K+ > 4 < 5 and Magnesium  > 2. -Turn patient Q 2 hours  -? Relistor -Defer recommendations regarding rectal tube placement and therapeutic flexible sigmoidoscopy to Dr. Meridee Score -? Colorectal surgery consult  -Avoid narcotic use  Hypokalemia -KCL replacement per the hospitalist   10 mm distal rectal polyp per flexible sigmoidoscopy 04/02/2023 -Eventual full colonoscopy as an outpatient  Elevated  LFTs. Alkp phos 177. ALT 63. Noncontrast CT 5/30 showed cholelithiasis, a normal liver without evidence of intra/extrahepatic biliary ductal dilatation. CTAP with contrast 04/01/2023 showed a normal liver with cholelithiasis and no evidence of biliary ductal dilatation.  -Hepatic panel in am  Normocytic anemia, secondary to chronic disease/CKD. No overt GI bleeding. Hg 10.2.   CKD stage IIIb. Cr 1.94.    Arnaldo Natal  05/08/2023, 9:59AM

## 2023-05-08 NOTE — TOC Initial Note (Addendum)
Transition of Care (TOC) - Initial/Assessment Note    Patient Details  Name: Terry Greeley Sr. MRN: [id number] Date of Birth: 1956/09/06  Transition of Care [facility]) CM/SW Contact:    Kingsley Plan, RN Phone Number: 05/08/2023, 2:31 PM  Clinical Narrative:                  Spoke to patient at bedside. Patient from home with son.   Patient has PCP Loura Back   Patient has wheel chair, hospital bed, hoyer lift, bedside commode and wheel chair van   TOC will continue to follow for discharge needs.   Patient states he has not had home health in past  Expected Discharge Plan: Home/Self Care Barriers to Discharge: Continued Medical Work up   Patient Goals and CMS Choice Patient states their goals for this hospitalization and ongoing recovery are:: to return to home          Expected Discharge Plan and Services   Discharge Planning Services: CM Consult                     DME Arranged: N/A         HH Arranged: NA          Prior Living Arrangements/Services   Lives with:: Adult Children Patient language and need for interpreter reviewed:: Yes Do you feel safe going back to the place where you live?: Yes      Need for Family Participation in Patient Care: Yes (Comment) Care giver support system in place?: Yes (comment) Current home services: DME Criminal Activity/Legal Involvement Pertinent to Current Situation/Hospitalization: No - Comment as needed  Activities of Daily Living      Permission Sought/Granted   Permission granted to share information with : No              Emotional Assessment Appearance:: Appears stated age Attitude/Demeanor/Rapport: Engaged Affect (typically observed): Accepting Orientation: : Oriented to Self, Oriented to Place, Oriented to  Time, Oriented to Situation Alcohol / Substance Use: Not Applicable Psych Involvement: No (comment)  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] Patient  Active Problem List   Diagnosis Date Noted   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   Abnormality of rectum 04/01/2023   Abnormal CT of the abdomen 04/01/2023   Abdominal distension 04/01/2023   Osteomyelitis (HCC) 06/01/2022   Hypothyroidism 06/01/2022   Stage 3b chronic kidney disease (CKD) (HCC) 06/01/2022   Macrocytic anemia 03/30/2022   Moderate protein-calorie malnutrition (HCC) 03/30/2022   Metabolic acidosis, normal anion gap (NAG) 03/30/2022   Biliary sludge 03/30/2022   Gastroenteritis 03/30/2022   Essential hypertension 05/17/2020   Hyperlipidemia 05/17/2020   Acute renal failure superimposed on stage 3a chronic kidney disease (HCC) 05/17/2020   Hypokalemia 05/17/2020   ICH (intracerebral hemorrhage) (HCC) d/t HTN 05/12/2020   Diabetes mellitus (HCC) 10/18/2019   PCP:  Loura Back, NP Pharmacy:   [id number] - [place], [place] - [address] AT [facility] OF [address] [place] [address] Phone: [phone] Fax: [phone]  [facility] [address] [place] [address] Phone: [phone] Fax: [phone]     Social Determinants of Health (SDOH) Social History: SDOH Screenings   Tobacco Use: Low Risk  (04/09/2023)   SDOH Interventions:     Readmission Risk Interventions    04/04/2023    3:30 PM  Readmission Risk Prevention Plan  Transportation Screening Complete  PCP or Specialist Appt within 5-7 Days Complete  Home Care Screening Complete  Medication Review (RN CM) Referral to Pharmacy

## 2023-05-08 NOTE — Progress Notes (Signed)
Carryover admission to the Day Admitter.  I discussed this case with the EDP, Dr. Nicanor Alcon.  Per these discussions:   This is a 39 male who is being admitted with recurrent Ogilvie's presenting with abdominal discomfort, distention, bloating, and diminished flatus production, with ensuing CT abdomen/pelvis demonstrating findings consistent with recurrent Ogilvie syndrome.   Vital signs appear stable.  He has a history of stage IIIb CKD, with renal function appears to be at baseline.  Additional presenting labs are notable for mild hypokalemia, with presenting potassium level 3.1.  EDP contacted on-call [facility], Dr. Myrtie Neither, requesting formal consultation.   I have placed an order for inpatient admission to med/tele for further evaluation management of the above.  I have placed some additional preliminary admit orders via the adult multi-morbid admission order set. I have also ordered n.p.o., as needed Zofran, prn IV fentanyl, continuous lactated Ringer's.  Also ordered potassium chloride 20 meq iv over 2 hours.  Morning labs in the form of CMP, CBC, and serum magnesium level have also been ordered.    Newton Pigg, DO Hospitalist

## 2023-05-08 NOTE — ED Notes (Signed)
ED TO INPATIENT HANDOFF REPORT  ED Nurse Name and Phone #: [phone]  S Name/Age/Gender [name] Sr. 67 y.o. male Room/Bed: 037C/037C  Code Status   Code Status: Full Code  Home/SNF/Other Home Patient oriented to: self, place, time, and situation Is this baseline? 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  Hx of similar episode 2 wks ago . Hx of stroke, bed bound at baseline EMS VS: 200/98, HR: 76, cbg 108   Allergies No Known Allergies  Level of Care/Admitting Diagnosis ED Disposition     ED Disposition  Admit   Condition  --   Comment  Hospital Area: [facility] [[id number]]  Level of Care: Telemetry Medical [104]  May admit patient to [facility] or [facility] if equivalent level of care is available:: No  Covid Evaluation: Asymptomatic - no recent exposure (last 10 days) testing not required  Diagnosis: Ogilvie's syndrome [960454]  Admitting Physician: Angie Fava [[id number]]  Attending Physician: Angie Fava [[id number]]  Certification:: I certify this patient will need inpatient services for at least 2 midnights  Estimated Length of Stay: 2          B Medical/Surgery History Past Medical History:  Diagnosis Date   Anemia    Bedbound    uses wheel chair   Chronic kidney disease    stage 3   Diabetes mellitus without complication (HCC)    no meds   HLD (hyperlipidemia)    Hypertension    Hypothyroidism    Intracranial bleeding (HCC)    Intracranial bleeding (HCC)    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 ENDOSCOPY;  Service: Gastroenterology;  Laterality: N/A;   FLEXIBLE SIGMOIDOSCOPY N/A 04/07/2023   Procedure: FLEXIBLE SIGMOIDOSCOPY;  Surgeon: Shellia Cleverly, DO;  Location: [facility] ENDOSCOPY;  Service: Gastroenterology;  Laterality: N/A;   TOE AMPUTATION Bilateral    x 3,   2 on left and 1 on right     A IV Location/Drains/Wounds Patient Lines/Drains/Airways Status     Active Line/Drains/Airways     Name Placement date Placement time Site Days   Peripheral IV 05/07/23 18 G 1.88" Left Forearm 05/07/23  2215  Forearm  1            Intake/Output Last 24 hours No intake or output data in the 24 hours ending 05/08/23 0159  Labs/Imaging Results for orders placed or performed during the hospital encounter of 05/07/23 (from the past 48 hour(s))  CBC with Differential     Status: Abnormal   Collection Time: 05/07/23 10:17 PM  Result Value Ref Range   WBC 15.7 (H) 4.0 - 10.5 K/uL   RBC 3.52 (L) 4.22 - 5.81 MIL/uL   Hemoglobin 10.8 (L) 13.0 - 17.0 g/dL   HCT 21.3 (L) 08.6 - 57.8 %  MCV 95.7 80.0 - 100.0 fL   MCH 30.7 26.0 - 34.0 pg   MCHC 32.0 30.0 - 36.0 g/dL   RDW 40.9 81.1 - 91.4 %   Platelets 282 150 - 400 K/uL   nRBC 0.0 0.0 - 0.2 %   Neutrophils Relative % 72 %   Neutro Abs 11.3 (H) 1.7 - 7.7 K/uL   Lymphocytes Relative 17 %   Lymphs Abs 2.7 0.7 - 4.0 K/uL   Monocytes Relative 8 %   Monocytes Absolute 1.3 (H) 0.1 - 1.0 K/uL   Eosinophils Relative 2 %   Eosinophils Absolute 0.3 0.0 - 0.5 K/uL   Basophils Relative 0 %   Basophils Absolute 0.1 0.0 - 0.1 K/uL   Immature Granulocytes 1 %   Abs Immature Granulocytes 0.11 (H) 0.00 - 0.07 K/uL    Comment: Performed at [facility], [address], [place], [place] [address]   Comprehensive metabolic panel     Status: Abnormal   Collection Time: 05/07/23 11:19 PM  Result Value Ref Range   Sodium 138 135 - 145 mmol/L   Potassium 3.1 (L) 3.5 - 5.1 mmol/L   Chloride 106 98 - 111 mmol/L   CO2 20 (L) 22 - 32 mmol/L   Glucose, Bld 126 (H) 70 - 99 mg/dL    Comment: Glucose reference range applies only to samples taken after fasting for at least 8 hours.   BUN 24 (H) 8 - 23 mg/dL   Creatinine, Ser 6.21 (H) 0.61 - 1.24 mg/dL   Calcium 8.8 (L) 8.9 - 10.3 mg/dL   Total Protein 6.7 6.5 - 8.1 g/dL   Albumin 2.9 (L) 3.5 - 5.0 g/dL   AST 28 15 - 41 U/L   ALT 63 (H) 0 - 44 U/L   Alkaline Phosphatase 177 (H) 38 - 126 U/L   Total Bilirubin 0.7 0.3 - 1.2 mg/dL   GFR, Estimated 37 (L) >60 mL/min    Comment: (NOTE) Calculated using the CKD-EPI Creatinine Equation (2021)    Anion gap 12 5 - 15    Comment: Performed at [facility], [address], [place], [place] [address]  I-stat chem 8, ED (not at [facility], [facility] or [facility])     Status: Abnormal   Collection Time: 05/07/23 11:25 PM  Result Value Ref Range   Sodium 142 135 - 145 mmol/L   Potassium 3.1 (L) 3.5 - 5.1 mmol/L   Chloride 108 98 - 111 mmol/L   BUN 25 (H) 8 - 23 mg/dL   Creatinine, Ser 7.84 (H) 0.61 - 1.24 mg/dL   Glucose, Bld 696 (H) 70 - 99 mg/dL    Comment: Glucose reference range applies only to samples taken after fasting for at least 8 hours.   Calcium, Ion 1.20 1.15 - 1.40 mmol/L   TCO2 22 22 - 32 mmol/L   Hemoglobin 11.2 (L) 13.0 - 17.0 g/dL   HCT 29.5 (L) 28.4 - 13.2 %   CT Renal Stone Study  Result Date: 05/07/2023 CLINICAL DATA:  Ogilvie syndrome, abdominal/flank pain EXAM: CT ABDOMEN AND PELVIS WITHOUT CONTRAST TECHNIQUE: Multidetector CT imaging of the abdomen and pelvis was performed following the standard protocol without IV contrast. RADIATION DOSE REDUCTION: This exam was performed according to the departmental dose-optimization program which includes automated exposure control, adjustment of  the mA and/or kV according to patient size and/or use of iterative reconstruction technique. COMPARISON:  04/01/2023 FINDINGS: Lower chest: Moderate right pleural effusion with compressive atelectasis of  the right lower lobe again noted. No acute abnormality. Extensive multi-vessel coronary artery calcification. Hepatobiliary: Cholelithiasis without pericholecystic inflammatory change. Liver unremarkable on this noncontrast examination. No intra or extrahepatic biliary ductal dilation. Pancreas: Unremarkable Spleen: Unremarkable Adrenals/Urinary Tract: The adrenal glands are unremarkable. The kidneys are atrophic bilaterally, unchanged from prior examination. Simple cortical cyst arises from the lower pole the right kidney, unchanged, for which no follow-up imaging is recommended. The kidneys are otherwise unremarkable. The bladder is unremarkable. Stomach/Bowel: There is diffuse gaseous distension of the colon in keeping with a colonic ileus. The sigmoid colon is particularly distended measuring up to 13.5 cm in diameter, however, this appears similar to prior examination and there is no superimposed volvulus identified. The stomach, small bowel, and large bowel are otherwise unremarkable. No focal inflammatory change. Appendix normal. No free intraperitoneal gas or fluid. Vascular/Lymphatic: Aortic atherosclerosis. No enlarged abdominal or pelvic lymph nodes. Reproductive: Prostate is unremarkable. Other: No abdominal wall hernia Musculoskeletal: Degenerative changes are seen within the thoracolumbar spine. No acute bone abnormality. Significant fatty infiltration of the visualized skeletal musculature. IMPRESSION: 1. Diffuse gaseous distension of the colon in keeping with a colonic ileus. The sigmoid colon is particularly distended measuring up to 13.5 cm in diameter, however, this appears similar to prior examination and there is no superimposed volvulus identified. 2. Moderate right pleural effusion with  compressive atelectasis of the right lower lobe. 3. Extensive multi-vessel coronary artery calcification. 4. Cholelithiasis. Aortic Atherosclerosis (ICD10-I70.0). Electronically Signed   By: Helyn Numbers M.D.   On: 05/07/2023 23:54   DG Abdomen 1 View  Result Date: 05/07/2023 CLINICAL DATA:  Abdominal distension EXAM: ABDOMEN - 1 VIEW COMPARISON:  04/10/2023 FINDINGS: There is marked gaseous distension of the colon with the distal sigmoid colon measuring up to 18 cm in diameter. While this has a similar appearance to prior CT examination of 04/01/2023, the degree of sigmoid distension appears asymmetric to the remainder of the colon and an underlying sigmoid volvulus is not excluded. No free intraperitoneal gas. Degenerative changes are seen within the lumbar spine. IMPRESSION: 1. Marked gaseous distension of the colon with the distal sigmoid colon measuring up to 18 cm in diameter. While this has a similar appearance to prior CT examination of 04/01/2023, an underlying sigmoid volvulus is not excluded. Repeat CT imaging would be helpful if there is clinical suspicion of underlying bowel volvulus. Electronically Signed   By: Helyn Numbers M.D.   On: 05/07/2023 22:44    Pending Labs Unresulted Labs (From admission, onward)     Start     Ordered   05/08/23 0500  CBC with Differential/Platelet  Tomorrow morning,   R        05/08/23 0054   05/08/23 0500  Comprehensive metabolic panel  Tomorrow morning,   R        05/08/23 0054   05/08/23 0500  Magnesium  Tomorrow morning,   R        05/08/23 0054            Vitals/Pain Today's Vitals   05/07/23 2156 05/07/23 2200 05/08/23 0115 05/08/23 0116  BP:  (!) 156/81 (!) 147/68   Pulse:  65 62   Resp:  19 14   Temp:    98.2 F (36.8 C)  TempSrc:    Oral  SpO2:  100% 99%   Weight:      Height:      PainSc: 0-No pain       Isolation Precautions No active  isolations  Medications Medications  acetaminophen (TYLENOL) tablet 650 mg (has no  administration in time range)    Or  acetaminophen (TYLENOL) suppository 650 mg (has no administration in time range)  ondansetron (ZOFRAN) injection 4 mg (has no administration in time range)  naloxone [facility]) injection 0.4 mg (has no administration in time range)  fentaNYL (SUBLIMAZE) injection 25 mcg (has no administration in time range)  lactated ringers infusion ( Intravenous New Bag/Given 05/08/23 0114)  potassium chloride 10 mEq in 100 mL IVPB (10 mEq Intravenous New Bag/Given 05/08/23 0115)    Mobility non-ambulatory     Focused Assessments     R Recommendations: See Admitting Provider Note  Report given to:   Additional Notes:

## 2023-05-08 NOTE — Progress Notes (Signed)
Lucciano Saya Sr. is a 67 y.o. male patient admitted. Awake, alert - oriented  X 4 - no acute distress noted.  VSS - Blood pressure (!) 163/71, pulse 62, temperature (!) 97.5 F (36.4 C), temperature source Oral, resp. rate 16, height 6' (1.829 m), weight 107.6 kg, SpO2 99 %.    IV in place, occlusive dsg intact without redness.  Orientation to room, and floor completed.  Admission INP armband ID verified with patient/family, and in place.   SR up x 2, fall assessment complete, bed bound and wound to buttocks, woc consult placed. associated with falls, and verbalized understanding to call nsg before up out of bed.  Call light within reach, patient able to voice, and demonstrate understanding.      Will cont to eval and treat per MD orders.  Arvilla Market, RN 05/08/2023 8:13 AM

## 2023-05-08 NOTE — H&P (Signed)
History and Physical    Patient: Terry Soscia Sr. WUJ:[id number] DOB: Aug 07, 1956 DOA: 05/07/2023 DOS: the patient was seen and examined on 05/08/2023 PCP: Loura Back, NP  Patient coming from: Home-lives with his son who is his primary caretaker  Chief Complaint:  Chief Complaint  Patient presents with   Bloated   HPI: Terry Berendes Sr. is a 67 y.o. 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  Sigmoidoscopy done at that time revealed a dilated colon above the anus which was decompressed and a 10 mm polyp in the distal rectum.  He underwent a second sigmoidoscopy 5 days later during that same hospitalization with placement of rectal tube in the colon was fully decompressed with the colonoscope.  He was also given MiraLAX, Colace and 2 doses of Relistor and discharged 1 May.  He was prescribed Relistor daily but medication was denied by Medicare.  According to GI notes a colonoscopy for rectal polypectomy was planned as an outpatient.  Patient confirmed to me that since discharge she had not been on any GI medications to help with his Ogilvie syndrome.  The GI service to determine if patient was taking Colace twice daily and MiraLAX as needed.  3 to 4 days prior to presentation he began developing progressive abdominal distention without nausea or vomiting or abdominal pain.  Just prior to admission he stopped having bowel movements and stopped passing flatus.  Patient is bedbound but can transfer from bed to wheelchair if he has assistance and according to the son he sits up in the chair for at least 7 hours and then returns to bed.  Upon arrival to the ER patient was afebrile and mildly hypertensive and room  air sats were 100%.  Labs obtained in the ED shows mild hypokalemia with potassium of 3.1, stable creatinine based on known renal dysfunction, mild leukocytosis 15,700 with no left shift, CT abdomen stone study did reveal colonic distention consistent with an ileus with the largest area of distention measuring 13.5 cm.  As noted above EDP has been consulted.  Hospitalist team also consulted to evaluate the patient for admission.   Review of Systems: As mentioned in the history of present illness. All other systems reviewed and are negative. Past Medical History:  Diagnosis Date   Anemia    Bedbound    uses wheel chair   Chronic kidney disease    stage 3   Diabetes mellitus without complication (HCC)    no meds   HLD (hyperlipidemia)    Hypertension    Hypothyroidism    Intracranial bleeding (HCC)    Intracranial bleeding (HCC)    Paraplegia (HCC)    can move L arm only   Stroke [facility]) 2021   Past Surgical History:  Procedure Laterality Date   AMPUTATION TOE Left 06/03/2022   Procedure: AMPUTATION TOE;  Surgeon: Vivi Barrack, DPM;  Location: [facility] OR;  Service: Podiatry;  Laterality: Left;   BOWEL DECOMPRESSION N/A 04/07/2023   Procedure: BOWEL DECOMPRESSION;  Surgeon: Shellia Cleverly, DO;  Location: [facility] ENDOSCOPY;  Service: Gastroenterology;  Laterality: N/A;   COLONOSCOPY     FLEXIBLE SIGMOIDOSCOPY N/A 04/02/2023   Procedure: FLEXIBLE SIGMOIDOSCOPY;  Surgeon: Tressia [name], MD;  Location: [facility] ENDOSCOPY;  Service:  Gastroenterology;  Laterality: N/A;   FLEXIBLE SIGMOIDOSCOPY N/A 04/07/2023   Procedure: FLEXIBLE SIGMOIDOSCOPY;  Surgeon: Shellia Cleverly, DO;  Location: [facility] ENDOSCOPY;  Service: Gastroenterology;  Laterality: N/A;   TOE AMPUTATION Bilateral    x 3,   2 on left and 1 on right   Social History:  reports that he has never smoked. He has never used smokeless tobacco. He reports that he does not currently use alcohol. He reports that he does not currently use  drugs.  No Known Allergies  Family History  Family history unknown: Yes    Prior to Admission medications   Medication Sig Start Date End Date Taking? Authorizing Provider  acetaminophen (TYLENOL) 500 MG tablet Take 1,000 mg by mouth as needed for moderate pain.   Yes [provider]  allopurinol (ZYLOPRIM) 100 MG tablet Take 100 mg by mouth 2 (two) times daily. 03/02/23  Yes [provider]  amLODipine (NORVASC) 10 MG tablet Take 1 tablet (10 mg total) by mouth daily. 04/11/23  Yes Hongalgi, Maximino Greenland, MD  atorvastatin (LIPITOR) 20 MG tablet Take 20 mg by mouth at bedtime.   Yes [provider]  Calcium 250 MG CAPS Take 250 mg by mouth daily.   Yes [provider]  docusate sodium (COLACE) 100 MG capsule Take 1 capsule (100 mg total) by mouth 2 (two) times daily. 04/11/23  Yes Hongalgi, Maximino Greenland, MD  glimepiride (AMARYL) 1 MG tablet Take 1 mg by mouth daily. 04/20/23  Yes [provider]  levothyroxine (SYNTHROID) 50 MCG tablet Take 1 tablet (50 mcg total) by mouth daily before breakfast. 04/11/23  Yes Hongalgi, Maximino Greenland, MD  polyethylene glycol (MIRALAX / GLYCOLAX) 17 g packet Take 17 g by mouth 2 (two) times daily. Patient taking differently: Take 17 g by mouth as needed for moderate constipation. 04/11/23  Yes Hongalgi, Maximino Greenland, MD  potassium chloride SA (KLOR-CON M) 20 MEQ tablet Take 2 tablets (40 mEq total) by mouth daily. Patient taking differently: Take 40 mEq by mouth 2 (two) times daily. 04/11/23  Yes Hongalgi, Maximino Greenland, MD  sodium bicarbonate 650 MG tablet Take 2 tablets (1,300 mg total) by mouth 3 (three) times daily. Patient taking differently: Take 650 mg by mouth 3 (three) times daily. 06/08/22  Yes Rolly Salter, MD  traZODone (DESYREL) 50 MG tablet Take 1 tablet (50 mg total) by mouth at bedtime as needed for sleep. 06/01/20  Yes Leroy Sea, MD  amLODipine (NORVASC) 5 MG tablet Take 5 mg by mouth daily. Patient not taking: Reported on  05/08/2023    [provider]  Methylnaltrexone Bromide (RELISTOR) 150 MG TABS Take 1 tablet (150 mg total) by mouth daily. Patient not taking: Reported on 05/08/2023 04/11/23   Elease Etienne, MD  metoprolol tartrate (LOPRESSOR) 50 MG tablet Take 50 mg by mouth 2 (two) times daily. Patient not taking: Reported on 05/08/2023    [provider]    Physical Exam: Vitals:   05/08/23 0115 05/08/23 0116 05/08/23 0335 05/08/23 0747  BP: (!) 147/68  (!) 165/85 (!) 163/71  Pulse: 62  66 62  Resp: 14  16 16   Temp:  98.2 F (36.8 C) 98.5 F (36.9 C) (!) 97.5 F (36.4 C)  TempSrc:  Oral Oral Oral  SpO2: 99%  100% 99%  Weight:   107.6 kg   Height:       Constitutional: NAD, calm, comfortable Respiratory: clear to auscultation bilaterally, no wheezing, no crackles. Normal  respiratory effort. No accessory muscle use.  Cardiovascular: Regular rate and rhythm, no murmurs / rubs / gallops. No extremity edema. 2+ pedal pulses. No carotid bruits.  Abdomen: no tenderness, no masses palpated.  Abdomen is quite distended and tympanitic with echoic bowel sounds.   Musculoskeletal: no clubbing / cyanosis. No joint deformity upper and lower extremities. Good ROM, no contractures. Normal muscle tone.  Skin: no rashes, lesions, ulcers. No induration Neurologic: CN 2-12 grossly intact. Sensation intact, strength left side upper extremity 4/5 , right upper extremity patient unable to move, both lower extremities 1/5 Psychiatric: Normal judgment and insight. Alert and oriented x 3. Normal mood.     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 and continue IV fluid hydration  Acute hypokalemia Has received several doses of 10 mEq IV bolus potassium N.p.o. so we will need to bolus as needed and follow-up labs-follow-up electrolyte panel collected at 11:40 AM and currently is pending Magnesium is normal  Severe deconditioning secondary to prior intracranial hemorrhage and paraplegia Son is primary care Caretaker at home Patient has stage II pressure injury (see below)-wound care consultation pending Pressure Injury 05/08/23 Buttocks Right;Left Stage 2 -  Partial thickness loss of dermis presenting as a shallow open injury with a red, pink wound bed without slough. this is healed pink scar tissue and patchy areas of moisture associated skin damage, N (Active)  Date First Assessed/Time First Assessed: 05/08/23 0352   Location: Buttocks  Location Orientation: Right;Left  Staging: Stage 2 -  Partial thickness loss of dermis presenting as a shallow open injury with a red, pink wound bed without slough.  Wound D...    Assessments 05/08/2023  1:15 AM  Dressing Type Foam - Lift dressing to assess site every shift  Wound Length (cm) 6 cm  Wound Width (cm) 16 cm  Wound Depth (cm) 0 cm  Wound Surface Area (cm^2) 96 cm^2  Wound Volume (cm^3) 0 cm^3     No associated orders.  Serum albumin 2.9 therefore meets criteria for moderate protein calorie malnutrition and delayed healing of any wounds  Diabetes mellitus 2 Check hemoglobin A1c Follow CBGs and provide SSI Hold preadmission Amaryl since n.p.o.  Chronic kidney disease stage IIIb Current creatinine within baseline readings with a GFR in the mid 30s Avoid nephrotoxic medications  Hypertension Continue Norvasc  Hypothyroidism TSH 1.927 during April admission Continue Synthroid  HLD Continue Lipitor    Advance Care Planning:   Code Status: Full Code   DVT prophylaxis: Lovenox  Consults:  Gastroenterology  Family Communication: Patient only-gastroenterologist did speak to patient's family/son by telephone  Severity of Illness: The appropriate patient status for this patient is INPATIENT. Inpatient status is judged to be reasonable and necessary in order to provide the required intensity of service to ensure the patient's safety. The patient's presenting symptoms, physical exam findings, and initial radiographic and laboratory data in the context of their chronic comorbidities is felt to place them at high risk for further clinical deterioration. Furthermore, it is not anticipated that the patient will be medically stable for discharge from the hospital within 2 midnights  of admission.   * I certify that at the point of admission it is my clinical judgment that the patient will require inpatient hospital care spanning beyond 2 midnights from the point of admission due to high intensity of service, high risk for further deterioration and high frequency of surveillance required.*  Author: Junious Silk, NP 05/08/2023 12:22 PM  For on call review [email or web address].

## 2023-05-08 NOTE — Consult Note (Addendum)
WOC Nurse Consult Note: Reason for Consult: Consult requested for buttocks.  Pt is incontinent of loose stool and it is difficult to keep the affected area from becoming soiled.  Bilat buttocks with pink dry scar tissue from previous wounds which have healed, and patchy areas of pink moist partial thickness skin loss related to moisture associated skin damage. Affected area is approx 6X16cm. He has a fissure in the gluteal crease related to moisture; approx 1X.1X.1cm, pink and moist.  ICD-10 CM Codes for Irritant Dermatitis L24A2 - Due to fecal, urinary or dual incontinence Dressing procedure/placement/frequency: Topical treatment orders provided for bedside nurses to perform as follows to promote healing: Leave foam dressing off buttocks, since it is trapping stool underneath the dressing. Apply barrier cream to bilat buttocks PRN when turning or cleaning. Please re-consult if further assistance is needed.  Thank-you,  Cammie Mcgee MSN, RN, CWOCN, [place], CNS [phone]

## 2023-05-09 ENCOUNTER — Inpatient Hospital Stay (HOSPITAL_COMMUNITY): Payer: Medicare HMO

## 2023-05-09 DIAGNOSIS — K5981 Ogilvie syndrome: Secondary | ICD-10-CM | POA: Diagnosis not present

## 2023-05-09 DIAGNOSIS — E876 Hypokalemia: Secondary | ICD-10-CM | POA: Diagnosis not present

## 2023-05-09 DIAGNOSIS — K6389 Other specified diseases of intestine: Secondary | ICD-10-CM | POA: Diagnosis not present

## 2023-05-09 LAB — CBC
HCT: 35.3 % — ABNORMAL LOW (ref 39.0–52.0)
Hemoglobin: 11 g/dL — ABNORMAL LOW (ref 13.0–17.0)
MCH: 29.6 pg (ref 26.0–34.0)
MCHC: 31.2 g/dL (ref 30.0–36.0)
MCV: 95.1 fL (ref 80.0–100.0)
Platelets: 240 10*3/uL (ref 150–400)
RBC: 3.71 MIL/uL — ABNORMAL LOW (ref 4.22–5.81)
RDW: 15 % (ref 11.5–15.5)
WBC: 12 10*3/uL — ABNORMAL HIGH (ref 4.0–10.5)
nRBC: 0 % (ref 0.0–0.2)

## 2023-05-09 LAB — BASIC METABOLIC PANEL
Anion gap: 11 (ref 5–15)
Anion gap: 10 (ref 5–15)
BUN: 20 mg/dL (ref 8–23)
BUN: 18 mg/dL (ref 8–23)
CO2: 18 mmol/L — ABNORMAL LOW (ref 22–32)
CO2: 20 mmol/L — ABNORMAL LOW (ref 22–32)
Calcium: 8.7 mg/dL — ABNORMAL LOW (ref 8.9–10.3)
Calcium: 8.8 mg/dL — ABNORMAL LOW (ref 8.9–10.3)
Chloride: 109 mmol/L (ref 98–111)
Chloride: 108 mmol/L (ref 98–111)
Creatinine, Ser: 1.7 mg/dL — ABNORMAL HIGH (ref 0.61–1.24)
Creatinine, Ser: 1.74 mg/dL — ABNORMAL HIGH (ref 0.61–1.24)
GFR, Estimated: 43 mL/min — ABNORMAL LOW (ref >60)
GFR, Estimated: 42 mL/min — ABNORMAL LOW (ref >60)
Glucose, Bld: 125 mg/dL — ABNORMAL HIGH (ref 70–99)
Glucose, Bld: 143 mg/dL — ABNORMAL HIGH (ref 70–99)
Potassium: 2.8 mmol/L — ABNORMAL LOW (ref 3.5–5.1)
Potassium: 3.1 mmol/L — ABNORMAL LOW (ref 3.5–5.1)
Sodium: 138 mmol/L (ref 135–145)
Sodium: 138 mmol/L (ref 135–145)

## 2023-05-09 LAB — GLUCOSE, CAPILLARY
Glucose-Capillary: 94 mg/dL (ref 70–99)
Glucose-Capillary: 151 mg/dL — ABNORMAL HIGH (ref 70–99)
Glucose-Capillary: 114 mg/dL — ABNORMAL HIGH (ref 70–99)
Glucose-Capillary: 87 mg/dL (ref 70–99)

## 2023-05-09 LAB — MAGNESIUM: Magnesium: 2.1 mg/dL (ref 1.7–2.4)

## 2023-05-09 MED ORDER — SIMETHICONE 40 MG/0.6ML PO SUSP
80.0000 mg | Freq: Four times a day (QID) | ORAL | Status: DC
  Administered 2023-05-09 (×4): 80 mg via ORAL
  Filled 2023-05-09 (×6): qty 1.2

## 2023-05-09 MED ORDER — POTASSIUM CHLORIDE CRYS ER 20 MEQ PO TBCR
40.0000 meq | EXTENDED_RELEASE_TABLET | ORAL | Status: AC
  Administered 2023-05-09 (×2): 40 meq via ORAL
  Filled 2023-05-09 (×2): qty 2

## 2023-05-09 MED ORDER — POTASSIUM CHLORIDE CRYS ER 20 MEQ PO TBCR: 40.0000 meq | EXTENDED_RELEASE_TABLET | Freq: Once | ORAL | Status: DC

## 2023-05-09 MED ORDER — POTASSIUM CHLORIDE CRYS ER 20 MEQ PO TBCR
40.0000 meq | EXTENDED_RELEASE_TABLET | Freq: Two times a day (BID) | ORAL | Status: DC
  Filled 2023-05-09: qty 2

## 2023-05-09 MED ORDER — INSULIN ASPART 100 UNIT/ML IJ SOLN
0.0000 [IU] | INTRAMUSCULAR | Status: DC
  Administered 2023-05-09: 2 [IU] via SUBCUTANEOUS
  Administered 2023-05-10 – 2023-05-12 (×4): 1 [IU] via SUBCUTANEOUS

## 2023-05-09 MED ORDER — ALLOPURINOL 100 MG PO TABS
100.0000 mg | ORAL_TABLET | Freq: Two times a day (BID) | ORAL | Status: DC
  Administered 2023-05-09 – 2023-05-15 (×12): 100 mg via ORAL
  Filled 2023-05-09 (×12): qty 1

## 2023-05-09 NOTE — Hospital Course (Signed)
PMH of paraplegia, CVA, HTN, HLD, CKD 3A, PVD presents with complaints of abdominal distention secondary to recurrent Oglive syndrome.  GI following.

## 2023-05-09 NOTE — Progress Notes (Signed)
0000 cbg 87. No insulin given. Pt given oj to sit if he wakes up to drink to protect blood sugar.  BP 160/74 HR 68

## 2023-05-09 NOTE — Progress Notes (Signed)
[facility] Progress Note  CC:   Recurrent Ogilvie's Syndrome    Subjective: He denies having any nausea or vomiting.  No abdominal pain.  No BM today.  No gas per the rectum.  He is tolerating a clear liquid diet.  No family at the bedside.  Objective:  Vital signs in last 24 hours: Temp:  [97.7 F (36.5 C)-97.8 F (36.6 C)] 97.7 F (36.5 C) (06/01 0736) Pulse Rate:  [59-69] 69 (06/01 0736) Resp:  [16-20] 16 (06/01 0736) BP: (171-187)/(80-99) 177/80 (06/01 0736) SpO2:  [100 %] 100 % (06/01 0736) Weight:  [112.1 kg] 112.1 kg (06/01 0500) Last BM Date : 05/08/23 General: Alert 67 year old male in no acute distress. Heart: Regular rate and rhythm, no murmurs. Pulm: Breath sounds clear, decreased in the bases. Abdomen: Abdomen appears more distended today when compared to yesterday's exam.  Abdomen is soft and nontender.  Scattered tinkering bowel sounds to all 4 quadrants. Extremities: Trace edema.  Neurologic:  Alert and  oriented x 4.  Alert and  oriented x 4. RUE/RLE weakness > LUE/LLE.  Psych:  Alert and cooperative. Normal mood and affect.  Intake/Output from previous day: 05/31 0701 - 06/01 0700 In: -  Out: 1950 [Urine:1950] Intake/Output this shift: Total I/O In: -  Out: 750 [Urine:750]  Lab Results: Recent Labs    05/07/23 2217 05/07/23 2325 05/08/23 0216 05/09/23 0043  WBC 15.7*  --  13.3* 12.0*  HGB 10.8* 11.2* 10.2* 11.0*  HCT 33.7* 33.0* 32.7* 35.3*  PLT 282  --  256 240   BMET Recent Labs    05/08/23 0216 05/08/23 1140 05/09/23 0043  NA 139 135 138  K 2.9* 2.8* 2.8*  CL 110 107 109  CO2 20* 19* 18*  GLUCOSE 108* 93 125*  BUN 22 22 20   CREATININE 1.92* 1.82* 1.70*  CALCIUM 8.7* 8.5* 8.7*   LFT Recent Labs    05/08/23 0216  PROT 6.7  ALBUMIN 2.8*  AST 22  ALT 59*  ALKPHOS 186*  BILITOT 0.8   PT/INR No results for input(s): "LABPROT", "INR" in the last 72 hours. Hepatitis Panel No results for input(s): "HEPBSAG",  "HCVAB", "HEPAIGM", "HEPBIGM" in the last 72 hours.  CT Renal Stone Study  Result Date: 05/07/2023 CLINICAL DATA:  Ogilvie syndrome, abdominal/flank pain EXAM: CT ABDOMEN AND PELVIS WITHOUT CONTRAST TECHNIQUE: Multidetector CT imaging of the abdomen and pelvis was performed following the standard protocol without IV contrast. RADIATION DOSE REDUCTION: This exam was performed according to the departmental dose-optimization program which includes automated exposure control, adjustment of the mA and/or kV according to patient size and/or use of iterative reconstruction technique. COMPARISON:  04/01/2023 FINDINGS: Lower chest: Moderate right pleural effusion with compressive atelectasis of the right lower lobe again noted. No acute abnormality. Extensive multi-vessel coronary artery calcification. Hepatobiliary: Cholelithiasis without pericholecystic inflammatory change. Liver unremarkable on this noncontrast examination. No intra or extrahepatic biliary ductal dilation. Pancreas: Unremarkable Spleen: Unremarkable Adrenals/Urinary Tract: The adrenal glands are unremarkable. The kidneys are atrophic bilaterally, unchanged from prior examination. Simple cortical cyst arises from the lower pole the right kidney, unchanged, for which no follow-up imaging is recommended. The kidneys are otherwise unremarkable. The bladder is unremarkable. Stomach/Bowel: There is diffuse gaseous distension of the colon in keeping with a colonic ileus. The sigmoid colon is particularly distended measuring up to 13.5 cm in diameter, however, this appears similar to prior examination and there is no superimposed volvulus identified. The stomach, small bowel, and large bowel  are otherwise unremarkable. No focal inflammatory change. Appendix normal. No free intraperitoneal gas or fluid. Vascular/Lymphatic: Aortic atherosclerosis. No enlarged abdominal or pelvic lymph nodes. Reproductive: Prostate is unremarkable. Other: No abdominal wall  hernia Musculoskeletal: Degenerative changes are seen within the thoracolumbar spine. No acute bone abnormality. Significant fatty infiltration of the visualized skeletal musculature. IMPRESSION: 1. Diffuse gaseous distension of the colon in keeping with a colonic ileus. The sigmoid colon is particularly distended measuring up to 13.5 cm in diameter, however, this appears similar to prior examination and there is no superimposed volvulus identified. 2. Moderate right pleural effusion with compressive atelectasis of the right lower lobe. 3. Extensive multi-vessel coronary artery calcification. 4. Cholelithiasis. Aortic Atherosclerosis (ICD10-I70.0). Electronically Signed   By: Helyn Numbers M.D.   On: 05/07/2023 23:54   DG Abdomen 1 View  Result Date: 05/07/2023 CLINICAL DATA:  Abdominal distension EXAM: ABDOMEN - 1 VIEW COMPARISON:  04/10/2023 FINDINGS: There is marked gaseous distension of the colon with the distal sigmoid colon measuring up to 18 cm in diameter. While this has a similar appearance to prior CT examination of 04/01/2023, the degree of sigmoid distension appears asymmetric to the remainder of the colon and an underlying sigmoid volvulus is not excluded. No free intraperitoneal gas. Degenerative changes are seen within the lumbar spine. IMPRESSION: 1. Marked gaseous distension of the colon with the distal sigmoid colon measuring up to 18 cm in diameter. While this has a similar appearance to prior CT examination of 04/01/2023, an underlying sigmoid volvulus is not excluded. Repeat CT imaging would be helpful if there is clinical suspicion of underlying bowel volvulus. Electronically Signed   By: Helyn Numbers M.D.   On: 05/07/2023 22:44    Assessment / Plan:  67 year old male with recurrent abdominal distention, Ogilvie syndrome vs ileus. Inactivity likely a contributing factor. CTAP without contrast 05/07/2023 showed diffuse gaseous distention of the colon consistent with a colonic ileus and  the sigmoid colon was partially distended measuring up to 13.5 cm without superimposed volvulus. WBC 13.3. Afebrile. On Zosyn IV. Received Relistor QD x 2 days. Hemodynamically stable. -Clear liquid diet -Continue LR @ 75cc/hr -Keep K+ > 4 < 5 and Magnesium  > 2. -Turn patient Q 2 hours  -Abdominal xray this afternoon -To verify further Relistor injections after xray reviewed  -Defer recommendations regarding rectal tube placement and therapeutic flexible sigmoidoscopy to Dr. Meridee Score -? Colorectal surgery consult  -Avoid narcotic use   Hypokalemia. K + 2.8. Received KCL IV x 4 on 5/30. On KCL 40 meq po Q 2 hrs today.  -KCL replacement per the hospitalist    10 mm distal rectal polyp per flexible sigmoidoscopy 04/02/2023 -Eventual full colonoscopy as an outpatient   Elevated LFTs. Alkp phos 177. ALT 63. Noncontrast CT 5/30 showed cholelithiasis, a normal liver without evidence of intra/extrahepatic biliary ductal dilatation. CTAP with contrast 04/01/2023 showed a normal liver with cholelithiasis and no evidence of biliary ductal dilatation.  -Hepatic panel in am   Normocytic anemia, secondary to chronic disease/CKD. No overt GI bleeding. Hg 10.2 -> 11.    CKD stage IIIb. Cr 1.94 -> 1.70.      Principal Problem:   Ogilvie's syndrome     LOS: 1 day   Arnaldo Natal  05/09/2023, 1:41 PM

## 2023-05-09 NOTE — Plan of Care (Signed)
  Problem: Clinical Measurements: Goal: Will remain free from infection Outcome: Progressing   Problem: Clinical Measurements: Goal: Diagnostic test results will improve Outcome: Progressing   Problem: Clinical Measurements: Goal: Respiratory complications will improve Outcome: Progressing   Problem: Clinical Measurements: Goal: Cardiovascular complication will be avoided Outcome: Progressing   Problem: Nutrition: Goal: Adequate nutrition will be maintained Outcome: Progressing   Problem: Elimination: Goal: Will not experience complications related to bowel motility Outcome: Progressing

## 2023-05-09 NOTE — Progress Notes (Signed)
[facility] Progress Note Patient: Terry Barton. WUJ:[id number] DOB: November 07, 1956 DOA: 05/07/2023  DOS: the patient was seen and examined on 05/09/2023  Brief hospital course: PMH of paraplegia, CVA, HTN, HLD, CKD 3A, PVD presents with complaints of abdominal distention secondary to recurrent Oglive syndrome.  GI following. Assessment and Plan: Ogilvie's syndrome Recurrent. [place] [facility] consult. Patient was not taking Relistor on discharge. Recently hospitalized for the same and underwent decompressive sigmoidoscopy. Also had a rectal tube and stent placed. Currently being treated conservatively.   Essential hypertension Blood pressure stable. Continuing amlodipine.   Hyperlipidemia Holding statin for now.  Hypothyroidism Continue Synthroid.   CKD 3B. Baseline creatinine around 1.71.9. Remaining stable. Monitor.  Hypokalemia. Hypomagnesemia. Replacing aggressively. Telemetry monitor Monitor BMP as well.  Maintain K more than 4 and mag more than 2.   Rectal polyp Outpatient follow-up with GI.   Type 2 diabetes mellitus, uncontrolled with hyperglycemia without long-term insulin use. Monitor for now.  Sliding scale insulin.  Normocytic anemia H&H stable.  Monitor.  Subjective: Abdomen still distended.  No nausea no vomiting no fever no chills.  No BM so far today.  Had a BM yesterday.  Passing gas.  Physical Exam: General: in Mild distress, No Rash Cardiovascular: S1 and S2 Present, No Murmur Respiratory: Good respiratory effort, Bilateral Air entry present. No Crackles, No wheezes Abdomen: Bowel Sound present, abdomen distended but no tenderness Extremities: No edema Neuro: Alert and oriented x3, no new focal deficit  Data Reviewed: I have Reviewed nursing notes, Vitals, and Lab results. Since last encounter, pertinent lab results CBC and BMP   . I have ordered test including CBC and BMP  .  Ordered echocardiogram per GI recommendation.  Disposition: Status  is: Inpatient Remains inpatient appropriate because: Need further therapy for Oglive syndrome.  SCDs Start: 05/08/23 0055   Family Communication: No one at bedside Level of care: Telemetry Medical   Vitals:   05/09/23 0428 05/09/23 0500 05/09/23 0736 05/09/23 1539  BP: (!) 171/80  (!) 177/80 (!) 160/84  Pulse: 65  69 72  Resp: 20  16 16   Temp: 97.7 F (36.5 C)  97.7 F (36.5 C) (!) 97.4 F (36.3 C)  TempSrc: Oral  Oral Oral  SpO2: 100%  100% 100%  Weight:  112.1 kg    Height:         Author: Lynden Oxford, MD 05/09/2023 6:37 PM  Please look on [email or web address] to find out who is on call.

## 2023-05-10 ENCOUNTER — Inpatient Hospital Stay (HOSPITAL_COMMUNITY): Payer: Medicare HMO

## 2023-05-10 DIAGNOSIS — K5981 Ogilvie syndrome: Secondary | ICD-10-CM | POA: Diagnosis not present

## 2023-05-10 DIAGNOSIS — K6389 Other specified diseases of intestine: Secondary | ICD-10-CM | POA: Diagnosis not present

## 2023-05-10 DIAGNOSIS — I495 Sick sinus syndrome: Secondary | ICD-10-CM

## 2023-05-10 DIAGNOSIS — L8990 Pressure ulcer of unspecified site, unspecified stage: Secondary | ICD-10-CM | POA: Insufficient documentation

## 2023-05-10 LAB — ECHOCARDIOGRAM COMPLETE
Area-P 1/2: 2.84 cm2
Height: 72 in
MV M vel: 1.05 m/s
MV Peak grad: 4.4 mmHg
S' Lateral: 2.5 cm
Weight: 3950.64 oz

## 2023-05-10 LAB — COMPREHENSIVE METABOLIC PANEL
ALT: 39 U/L (ref 0–44)
AST: 19 U/L (ref 15–41)
Albumin: 3 g/dL — ABNORMAL LOW (ref 3.5–5.0)
Alkaline Phosphatase: 155 U/L — ABNORMAL HIGH (ref 38–126)
Anion gap: 8 (ref 5–15)
BUN: 17 mg/dL (ref 8–23)
CO2: 18 mmol/L — ABNORMAL LOW (ref 22–32)
Calcium: 8.7 mg/dL — ABNORMAL LOW (ref 8.9–10.3)
Chloride: 111 mmol/L (ref 98–111)
Creatinine, Ser: 1.7 mg/dL — ABNORMAL HIGH (ref 0.61–1.24)
GFR, Estimated: 43 mL/min — ABNORMAL LOW (ref >60)
Glucose, Bld: 92 mg/dL (ref 70–99)
Potassium: 4.1 mmol/L (ref 3.5–5.1)
Sodium: 137 mmol/L (ref 135–145)
Total Bilirubin: 0.6 mg/dL (ref 0.3–1.2)
Total Protein: 7.2 g/dL (ref 6.5–8.1)

## 2023-05-10 LAB — GLUCOSE, CAPILLARY
Glucose-Capillary: 119 mg/dL — ABNORMAL HIGH (ref 70–99)
Glucose-Capillary: 131 mg/dL — ABNORMAL HIGH (ref 70–99)
Glucose-Capillary: 84 mg/dL (ref 70–99)
Glucose-Capillary: 86 mg/dL (ref 70–99)
Glucose-Capillary: 88 mg/dL (ref 70–99)
Glucose-Capillary: 91 mg/dL (ref 70–99)
Glucose-Capillary: 92 mg/dL (ref 70–99)

## 2023-05-10 LAB — CBC
HCT: 37 % — ABNORMAL LOW (ref 39.0–52.0)
Hemoglobin: 11.7 g/dL — ABNORMAL LOW (ref 13.0–17.0)
MCH: 30.7 pg (ref 26.0–34.0)
MCHC: 31.6 g/dL (ref 30.0–36.0)
MCV: 97.1 fL (ref 80.0–100.0)
Platelets: 258 10*3/uL (ref 150–400)
RBC: 3.81 MIL/uL — ABNORMAL LOW (ref 4.22–5.81)
RDW: 15.1 % (ref 11.5–15.5)
WBC: 10.9 10*3/uL — ABNORMAL HIGH (ref 4.0–10.5)
nRBC: 0 % (ref 0.0–0.2)

## 2023-05-10 LAB — BASIC METABOLIC PANEL
Anion gap: 14 (ref 5–15)
BUN: 18 mg/dL (ref 8–23)
CO2: 17 mmol/L — ABNORMAL LOW (ref 22–32)
Calcium: 9 mg/dL (ref 8.9–10.3)
Chloride: 106 mmol/L (ref 98–111)
Creatinine, Ser: 1.82 mg/dL — ABNORMAL HIGH (ref 0.61–1.24)
GFR, Estimated: 40 mL/min — ABNORMAL LOW (ref >60)
Glucose, Bld: 139 mg/dL — ABNORMAL HIGH (ref 70–99)
Potassium: 3.4 mmol/L — ABNORMAL LOW (ref 3.5–5.1)
Sodium: 137 mmol/L (ref 135–145)

## 2023-05-10 LAB — MAGNESIUM
Magnesium: 2 mg/dL (ref 1.7–2.4)
Magnesium: 2 mg/dL (ref 1.7–2.4)

## 2023-05-10 MED ORDER — SIMETHICONE 80 MG PO CHEW
80.0000 mg | CHEWABLE_TABLET | Freq: Three times a day (TID) | ORAL | Status: DC
  Administered 2023-05-10 – 2023-05-15 (×19): 80 mg via ORAL
  Filled 2023-05-10 (×19): qty 1

## 2023-05-10 MED ORDER — POLYETHYLENE GLYCOL 3350 17 G PO PACK
17.0000 g | PACK | Freq: Two times a day (BID) | ORAL | Status: DC
  Administered 2023-05-10 – 2023-05-15 (×10): 17 g via ORAL
  Filled 2023-05-10 (×10): qty 1

## 2023-05-10 MED ORDER — GLYCERIN (LAXATIVE) 2 G RE SUPP
1.0000 | Freq: Every day | RECTAL | Status: DC
  Administered 2023-05-10 – 2023-05-13 (×4): 1 via RECTAL
  Filled 2023-05-10 (×6): qty 1

## 2023-05-10 MED ORDER — SENNA 8.6 MG PO TABS
1.0000 | ORAL_TABLET | Freq: Every day | ORAL | Status: DC
  Administered 2023-05-10 – 2023-05-11 (×2): 8.6 mg via ORAL
  Filled 2023-05-10 (×2): qty 1

## 2023-05-10 NOTE — Progress Notes (Signed)
[facility] Progress Note  CC:  Recurrent Ogilvie's Syndrome      Subjective: He denies having any nausea or vomiting.  No abdominal pain.  He feels his abdominal distention is about the same, no worse when compared to yesterday.  He passed 2 brown loose stools yesterday afternoon and around 11 PM last night.  No BM yet this morning.  No chest pain or shortness of breath.  No family at the bedside.   Objective:  Vital signs in last 24 hours: Temp:  [97.4 F (36.3 C)-98.5 F (36.9 C)] 98.5 F (36.9 C) (06/02 0741) Pulse Rate:  [67-73] 71 (06/02 0741) Resp:  [16-17] 16 (06/02 0741) BP: (152-174)/(74-87) 152/82 (06/02 0741) SpO2:  [98 %-100 %] 99 % (06/02 0741) Weight:  [161 kg] 112 kg (06/02 0500) Last BM Date : 05/09/23 General: Alert 67 year old male in no acute distress. Heart: Rhythm, no murmurs. Pulm: Breath sounds clear, decreased in the bases when auscultated posteriorly.  RN at bedside to assist with turning to left side. Abdomen: Abdomen significantly distended, abdomen somewhat tight but soft.  Positive bowel sounds to x 4 quadrants.  Mild abdominal anasarca. Extremities: Mild lower extremity edema. RUE/RLE weakness > LUE/LLE.  Generalized weakness to all 4 extremities. Neurologic:  Alert and oriented x 4.  Psych:  Alert and cooperative. Normal mood and affect.  Intake/Output from previous day: 06/01 0701 - 06/02 0700 In: 630 [P.O.:630] Out: 1420 [Urine:1420] Intake/Output this shift: No intake/output data recorded.  Lab Results: Recent Labs    05/08/23 0216 05/09/23 0043 05/10/23 0052  WBC 13.3* 12.0* 10.9*  HGB 10.2* 11.0* 11.7*  HCT 32.7* 35.3* 37.0*  PLT 256 240 258   BMET Recent Labs    05/09/23 0043 05/09/23 1623 05/10/23 0052  NA 138 138 137  K 2.8* 3.1* 4.1  CL 109 108 111  CO2 18* 20* 18*  GLUCOSE 125* 143* 92  BUN 20 18 17   CREATININE 1.70* 1.74* 1.70*  CALCIUM 8.7* 8.8* 8.7*   LFT Recent Labs    05/10/23 0052   PROT 7.2  ALBUMIN 3.0*  AST 19  ALT 39  ALKPHOS 155*  BILITOT 0.6   PT/INR No results for input(s): "LABPROT", "INR" in the last 72 hours. Hepatitis Panel No results for input(s): "HEPBSAG", "HCVAB", "HEPAIGM", "HEPBIGM" in the last 72 hours.  DG Abd 1 View  Result Date: 05/09/2023 CLINICAL DATA:  Distension EXAM: ABDOMEN - 1 VIEW COMPARISON:  05/07/2023 FINDINGS: Persistent marked gaseous distension of the:, most pronounced within the sigmoid region. No gross free intraperitoneal air on supine views. No radio-opaque calculi or other significant radiographic abnormality are seen. IMPRESSION: Persistent marked gaseous distension of the colon, most pronounced within the sigmoid region. Electronically Signed   By: Duanne Guess D.O.   On: 05/09/2023 17:27    Assessment / Plan:  67 year old male with recurrent abdominal distention, Ogilvie syndrome vs ileus. Immobility likely a contributing factor. Flex sig 04/02/2023 during prior hospitalization showed a dilated colon above the anus which was decompressed and a 10 mm polyp was identified in the distal rectum which was not resected. He continued to have significant abdominal distention and underwent a second sigmoidoscopy 04/07/2023 with placement of rectal tube with successful decompression. Abdominal distension continued to improved after receiving Relistor x 2, Colace and Miralax. Attempted to obtain Relistor as an outpatient, denied by Medicare.  Readmitted to the hospital 05/07/2023 with recurrent abdominal distension. CTAP without contrast 05/07/2023 showed diffuse gaseous distention of the  colon consistent with a colonic ileus and the sigmoid colon was partially distended measuring up to 13.5 cm without superimposed volvulus. Abd xray 6/1 showed persistent gaseous distention primarily to the sigmoid colon.  WBC 10.9. Afebrile. On Zosyn IV. Received Relistor QD x 2 days. Hemodynamically stable. -Full liquid diet -Continue LR @ 75cc/hr -Keep K+  > 4 < 5 and Magnesium  > 2 -BMP and magnesium level 2pm today -KUB in am -Turn patient Q 2 hours  -No plans for therapeutic sigmoidoscopy or surgical intervention at this time -Miralax bid -Senna laxative 1 tab Q HS, if tolerated increase to bid -Discontinue Dulcolax suppository  -Glycerin suppository Q HS -Avoid narcotic use -Await further recommendations per Dr. Meridee Score    Hypokalemia. K + 2.8 -> 3.1. Received aggressive IV and oral KCL. Today K+ 4.1. -KCL replacement per the hospitalist  -BMP and Magnesium level as ordered above   10 mm distal rectal polyp per flexible sigmoidoscopy 04/02/2023 -Eventual full colonoscopy as an outpatient   Elevated LFTs. Alkp phos 177 -> 155. ALT 63 -> 39. Noncontrast CT 5/30 showed cholelithiasis, a normal liver without evidence of intra/extrahepatic biliary ductal dilatation. CTAP with contrast 04/01/2023 showed a normal liver with cholelithiasis and no evidence of biliary ductal dilatation.  -Hepatic panel in am   Normocytic anemia, secondary to chronic disease/CKD. No overt GI bleeding. Hg 10.2 -> 11.    CKD stage IIIb. Cr 1.94 -> 1.70.        Principal Problem:   Ogilvie's syndrome     LOS: 2 days   Terry Barton  05/10/2023, 11:00 AM

## 2023-05-10 NOTE — Progress Notes (Signed)
[facility] Progress Note Patient: Terry Sidman Sr. JWJ:[id number] DOB: March 22, 1956 DOA: 05/07/2023  DOS: the patient was seen and examined on 05/10/2023  Brief hospital course: PMH of paraplegia, CVA, HTN, HLD, CKD 3A, PVD presents with complaints of abdominal distention secondary to recurrent [place] syndrome.  GI following. Assessment and Plan: Recurrent Ogilvie's syndrome [facility] consulted Patient was not taking Relistor on discharge. Recently hospitalized for the same and underwent decompressive sigmoidoscopy. Also had a rectal tube and stent placed. Currently being treated conservatively with IV Relistor, simethicone and bowel regimen.   Essential hypertension Blood pressure stable. Continuing amlodipine.   Hyperlipidemia Holding statin for now.   Hypothyroidism Continue Synthroid.   CKD 3B. Baseline creatinine around 1.7-1.9. Remaining stable. Monitor.   Hypokalemia. Hypomagnesemia. Replacing aggressively. Maintain K more than 4 and mag more than 2.   Rectal polyp Outpatient follow-up with GI.   Type 2 diabetes mellitus, uncontrolled with hyperglycemia without long-term insulin use. Monitor for now.  Sliding scale insulin.   Normocytic anemia H&H stable.  Monitor.  Obesity Body mass index is 33.49 kg/m.  Placing the pt at higher risk of poor outcomes.   Subjective: No nausea no vomiting.  No abdominal pain.  Had a BM.  No blood in the stool.  Abdomen still distended.  Physical Exam: General: in Mild distress, No Rash Cardiovascular: S1 and S2 Present, No Murmur Respiratory: Good respiratory effort, Bilateral Air entry present. No Crackles, No wheezes Abdomen: Bowel Sound present, unchanged abdominal distention, no tenderness Extremities: No edema Neuro: Alert and oriented x3, no new focal deficit  Data Reviewed: I have Reviewed nursing notes, Vitals, and Lab results. Since last encounter, pertinent lab results CBC and BMP   . I have ordered test  including CBC and BMP  .   Disposition: Status is: Inpatient Remains inpatient appropriate because: Need improvement in abdominal distention  SCDs Start: 05/08/23 0055   Family Communication: No one at bedside Level of care: Telemetry Medical   Vitals:   05/09/23 2321 05/10/23 0456 05/10/23 0500 05/10/23 0741  BP: (!) 160/74 (!) 171/87  (!) 152/82  Pulse: 69 67  71  Resp: 17 17  16   Temp: 98.1 F (36.7 C) 98 F (36.7 C)  98.5 F (36.9 C)  TempSrc:    Oral  SpO2: 100% 100%  99%  Weight:   112 kg   Height:         Author: Lynden Oxford, MD 05/10/2023 3:22 PM  Please look on [email or web address] to find out who is on call.

## 2023-05-10 NOTE — Progress Notes (Signed)
  Echocardiogram 2D Echocardiogram has been performed.  Maren Reamer 05/10/2023, 3:13 PM

## 2023-05-11 ENCOUNTER — Encounter (HOSPITAL_COMMUNITY): Payer: Self-pay | Admitting: Internal Medicine

## 2023-05-11 ENCOUNTER — Inpatient Hospital Stay (HOSPITAL_COMMUNITY): Payer: Medicare HMO

## 2023-05-11 DIAGNOSIS — E1129 Type 2 diabetes mellitus with other diabetic kidney complication: Secondary | ICD-10-CM

## 2023-05-11 DIAGNOSIS — R140 Abdominal distension (gaseous): Secondary | ICD-10-CM

## 2023-05-11 DIAGNOSIS — K5981 Ogilvie syndrome: Secondary | ICD-10-CM | POA: Diagnosis not present

## 2023-05-11 DIAGNOSIS — N189 Chronic kidney disease, unspecified: Secondary | ICD-10-CM

## 2023-05-11 DIAGNOSIS — I129 Hypertensive chronic kidney disease with stage 1 through stage 4 chronic kidney disease, or unspecified chronic kidney disease: Secondary | ICD-10-CM

## 2023-05-11 LAB — BASIC METABOLIC PANEL
Anion gap: 10 (ref 5–15)
Anion gap: 9 (ref 5–15)
BUN: 17 mg/dL (ref 8–23)
BUN: 19 mg/dL (ref 8–23)
CO2: 18 mmol/L — ABNORMAL LOW (ref 22–32)
CO2: 18 mmol/L — ABNORMAL LOW (ref 22–32)
Calcium: 8.9 mg/dL (ref 8.9–10.3)
Calcium: 8.9 mg/dL (ref 8.9–10.3)
Chloride: 109 mmol/L (ref 98–111)
Chloride: 109 mmol/L (ref 98–111)
Creatinine, Ser: 1.87 mg/dL — ABNORMAL HIGH (ref 0.61–1.24)
Creatinine, Ser: 1.91 mg/dL — ABNORMAL HIGH (ref 0.61–1.24)
GFR, Estimated: 38 mL/min — ABNORMAL LOW (ref >60)
GFR, Estimated: 37 mL/min — ABNORMAL LOW (ref >60)
Glucose, Bld: 105 mg/dL — ABNORMAL HIGH (ref 70–99)
Glucose, Bld: 145 mg/dL — ABNORMAL HIGH (ref 70–99)
Potassium: 3.2 mmol/L — ABNORMAL LOW (ref 3.5–5.1)
Potassium: 3.1 mmol/L — ABNORMAL LOW (ref 3.5–5.1)
Sodium: 137 mmol/L (ref 135–145)
Sodium: 136 mmol/L (ref 135–145)

## 2023-05-11 LAB — HEPATIC FUNCTION PANEL
ALT: 33 U/L (ref 0–44)
AST: 26 U/L (ref 15–41)
Albumin: 2.9 g/dL — ABNORMAL LOW (ref 3.5–5.0)
Alkaline Phosphatase: 149 U/L — ABNORMAL HIGH (ref 38–126)
Bilirubin, Direct: 0.2 mg/dL (ref 0.0–0.2)
Indirect Bilirubin: 0.4 mg/dL (ref 0.3–0.9)
Total Bilirubin: 0.6 mg/dL (ref 0.3–1.2)
Total Protein: 6.7 g/dL (ref 6.5–8.1)

## 2023-05-11 LAB — GLUCOSE, CAPILLARY
Glucose-Capillary: 108 mg/dL — ABNORMAL HIGH (ref 70–99)
Glucose-Capillary: 111 mg/dL — ABNORMAL HIGH (ref 70–99)
Glucose-Capillary: 103 mg/dL — ABNORMAL HIGH (ref 70–99)
Glucose-Capillary: 128 mg/dL — ABNORMAL HIGH (ref 70–99)
Glucose-Capillary: 142 mg/dL — ABNORMAL HIGH (ref 70–99)

## 2023-05-11 LAB — MAGNESIUM
Magnesium: 1.9 mg/dL (ref 1.7–2.4)
Magnesium: 2.1 mg/dL (ref 1.7–2.4)

## 2023-05-11 LAB — CBC
HCT: 34.7 % — ABNORMAL LOW (ref 39.0–52.0)
Hemoglobin: 11 g/dL — ABNORMAL LOW (ref 13.0–17.0)
MCH: 29.9 pg (ref 26.0–34.0)
MCHC: 31.7 g/dL (ref 30.0–36.0)
MCV: 94.3 fL (ref 80.0–100.0)
Platelets: 288 10*3/uL (ref 150–400)
RBC: 3.68 MIL/uL — ABNORMAL LOW (ref 4.22–5.81)
RDW: 14.7 % (ref 11.5–15.5)
WBC: 10.9 10*3/uL — ABNORMAL HIGH (ref 4.0–10.5)
nRBC: 0 % (ref 0.0–0.2)

## 2023-05-11 MED ORDER — METHYLNALTREXONE BROMIDE 12 MG/0.6ML ~~LOC~~ SOLN
6.0000 mg | Freq: Once | SUBCUTANEOUS | Status: DC
  Filled 2023-05-11 (×2): qty 0.6

## 2023-05-11 MED ORDER — MAGNESIUM SULFATE 2 GM/50ML IV SOLN
2.0000 g | Freq: Once | INTRAVENOUS | Status: AC
  Administered 2023-05-11: 2 g via INTRAVENOUS
  Filled 2023-05-11: qty 50

## 2023-05-11 MED ORDER — POTASSIUM CHLORIDE CRYS ER 20 MEQ PO TBCR
40.0000 meq | EXTENDED_RELEASE_TABLET | ORAL | Status: AC
  Administered 2023-05-11 (×2): 40 meq via ORAL
  Filled 2023-05-11 (×2): qty 2

## 2023-05-11 MED ORDER — MAGNESIUM OXIDE -MG SUPPLEMENT 400 (240 MG) MG PO TABS
400.0000 mg | ORAL_TABLET | Freq: Two times a day (BID) | ORAL | Status: DC
  Administered 2023-05-12 – 2023-05-15 (×6): 400 mg via ORAL
  Filled 2023-05-11 (×6): qty 1

## 2023-05-11 MED ORDER — POTASSIUM CHLORIDE CRYS ER 20 MEQ PO TBCR
30.0000 meq | EXTENDED_RELEASE_TABLET | Freq: Two times a day (BID) | ORAL | Status: DC
  Administered 2023-05-11 – 2023-05-12 (×2): 30 meq via ORAL
  Filled 2023-05-11 (×2): qty 1

## 2023-05-11 NOTE — Care Management Important Message (Signed)
Important Message  Patient Details  Name: Terry Debolt Sr. MRN: [id number] Date of Birth: 1956-01-11   Medicare Important Message Given:  Yes     Sherilyn Banker 05/11/2023, 11:51 AM

## 2023-05-11 NOTE — Consult Note (Signed)
Consult Note  Terry Embury Sr. 1955/12/16  [id number].    Requesting MD: Dr. Meridee Score Chief Complaint/Reason for Consult: oglivie's syndrome, diverting ileostomy  HPI:  67 y.o. male with medical history significant for CVA, h/o spinal cord injury with paraplegia, HTN, HLD, CKD 3A, PVD, narcotic dependence who presented to [facility] ED on 5/30 with abdominal distension. 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 He was discharged on bowel regimen including Relistor but was unable to obtain medication due to cost. Abdominal pain and distension started developing a few days prior to ED presentation and were worsening over time.   He was admitted to the hospitalist service and GI consulted. Since admission he has improved on bowel regimen with miralax and relistor. He has had decreased abdominal distension and pain, is tolerating full liquids without n/v, and had a liquid bowel movement yesterday.  Patient is bedbound at baseline but can transfer to wheelchair. He lives at home and has caregivers   Substance use: denies alcohol and substance use Allergies: NKDA Blood thinners: none Past Surgeries: no prior abdominal surgeries   ROS: Reviewed and as above  Family History  Family history unknown: Yes    Past Medical History:  Diagnosis Date   Anemia    Bedbound    uses wheel chair   Chronic kidney disease    stage 3   Diabetes mellitus without complication (HCC)    no meds   HLD (hyperlipidemia)    Hypertension    Hypothyroidism    Intracranial bleeding (HCC)    Intracranial bleeding (HCC)    Paraplegia (HCC)    can move L arm only   Stroke [facility]) 2021    Past Surgical History:  Procedure Laterality Date   AMPUTATION TOE Left 06/03/2022   Procedure: AMPUTATION TOE;  Surgeon: Vivi Barrack, DPM;  Location: [facility] OR;  Service:  Podiatry;  Laterality: Left;   BOWEL DECOMPRESSION N/A 04/07/2023   Procedure: BOWEL DECOMPRESSION;  Surgeon: Shellia Cleverly, DO;  Location: [facility] ENDOSCOPY;  Service: Gastroenterology;  Laterality: N/A;   COLONOSCOPY     FLEXIBLE SIGMOIDOSCOPY N/A 04/02/2023   Procedure: FLEXIBLE SIGMOIDOSCOPY;  Surgeon: Tressia [name], MD;  Location: [facility] ENDOSCOPY;  Service: Gastroenterology;  Laterality: N/A;   FLEXIBLE SIGMOIDOSCOPY N/A 04/07/2023   Procedure: FLEXIBLE SIGMOIDOSCOPY;  Surgeon: Shellia Cleverly, DO;  Location: [facility] ENDOSCOPY;  Service: Gastroenterology;  Laterality: N/A;   TOE AMPUTATION Bilateral    x 3,   2 on left and 1 on right    Social History:  reports that he has never smoked. He has never used smokeless tobacco. He reports that he does not currently use alcohol. He reports that he does not currently use drugs.  Allergies: No Known Allergies  Medications Prior to Admission  Medication Sig Dispense Refill   acetaminophen (TYLENOL) 500 MG tablet Take 1,000 mg by mouth as needed for moderate pain.     allopurinol (ZYLOPRIM) 100 MG tablet Take 100 mg by mouth 2 (two) times daily.     amLODipine (NORVASC) 10 MG tablet Take 1 tablet (10 mg total) by mouth daily. 30 tablet 1   atorvastatin (LIPITOR) 20 MG tablet Take 20 mg by mouth at bedtime.     Calcium 250 MG CAPS Take 250 mg by mouth daily.     docusate sodium (COLACE) 100 MG capsule Take 1  capsule (100 mg total) by mouth 2 (two) times daily. 60 capsule 1   glimepiride (AMARYL) 1 MG tablet Take 1 mg by mouth daily.     levothyroxine (SYNTHROID) 50 MCG tablet Take 1 tablet (50 mcg total) by mouth daily before breakfast.     polyethylene glycol (MIRALAX / GLYCOLAX) 17 g packet Take 17 g by mouth 2 (two) times daily. (Patient taking differently: Take 17 g by mouth as needed for moderate constipation.) 60 each 1   potassium chloride SA (KLOR-CON M) 20 MEQ tablet Take 2 tablets (40 mEq total) by mouth daily. (Patient taking differently:  Take 40 mEq by mouth 2 (two) times daily.)     sodium bicarbonate 650 MG tablet Take 2 tablets (1,300 mg total) by mouth 3 (three) times daily. (Patient taking differently: Take 650 mg by mouth 3 (three) times daily.) 120 tablet 0   traZODone (DESYREL) 50 MG tablet Take 1 tablet (50 mg total) by mouth at bedtime as needed for sleep. 30 tablet 0   Methylnaltrexone Bromide (RELISTOR) 150 MG TABS Take 1 tablet (150 mg total) by mouth daily. (Patient not taking: Reported on 05/08/2023) 30 tablet 1   metoprolol tartrate (LOPRESSOR) 50 MG tablet Take 50 mg by mouth 2 (two) times daily. (Patient not taking: Reported on 05/08/2023)      Blood pressure 139/83, pulse 71, temperature 98.3 F (36.8 C), temperature source Oral, resp. rate 16, height 6' (1.829 m), weight 112 kg, SpO2 100 %. Physical Exam: General: pleasant, WD, chronically ill appearing male who is laying in bed in NAD HEENT: head is normocephalic, atraumatic.  Sclera are noninjected.  Pupils equal and round. EOMs intact.  Ears and nose without any masses or lesions.  Mouth is pink and moist Heart: regular, rate, and rhythm.  Normal s1,s2. No obvious murmurs, gallops, or rubs noted.  Palpable radial and pedal pulses bilaterally Lungs: CTAB, no wheezes, rhonchi, or rales noted.  Respiratory effort nonlabored Abd: soft, NT, distended, no masses, hernias, or organomegaly MSK: moves LUE, contracture of RUE Skin: warm and dry with no masses, lesions, or rashes Psych: A&Ox3 with an appropriate affect.    Results for orders placed or performed during the hospital encounter of 05/07/23 (from the past 48 hour(s))  Glucose, capillary     Status: None   Collection Time: 05/09/23  1:24 PM  Result Value Ref Range   Glucose-Capillary 94 70 - 99 mg/dL    Comment: Glucose reference range applies only to samples taken after fasting for at least 8 hours.  Basic metabolic panel     Status: Abnormal   Collection Time: 05/09/23  4:23 PM  Result Value Ref  Range   Sodium 138 135 - 145 mmol/L   Potassium 3.1 (L) 3.5 - 5.1 mmol/L   Chloride 108 98 - 111 mmol/L   CO2 20 (L) 22 - 32 mmol/L   Glucose, Bld 143 (H) 70 - 99 mg/dL    Comment: Glucose reference range applies only to samples taken after fasting for at least 8 hours.   BUN 18 8 - 23 mg/dL   Creatinine, Ser 1.61 (H) 0.61 - 1.24 mg/dL   Calcium 8.8 (L) 8.9 - 10.3 mg/dL   GFR, Estimated 42 (L) >60 mL/min    Comment: (NOTE) Calculated using the CKD-EPI Creatinine Equation (2021)    Anion gap 10 5 - 15    Comment: Performed at [facility], [address] [place], [place] [address]  Glucose, capillary  Status: Abnormal   Collection Time: 05/09/23  4:56 PM  Result Value Ref Range   Glucose-Capillary 151 (H) 70 - 99 mg/dL    Comment: Glucose reference range applies only to samples taken after fasting for at least 8 hours.   Comment 1 Notify RN   Glucose, capillary     Status: Abnormal   Collection Time: 05/09/23  7:44 PM  Result Value Ref Range   Glucose-Capillary 114 (H) 70 - 99 mg/dL    Comment: Glucose reference range applies only to samples taken after fasting for at least 8 hours.  Glucose, capillary     Status: None   Collection Time: 05/09/23 11:15 PM  Result Value Ref Range   Glucose-Capillary 87 70 - 99 mg/dL    Comment: Glucose reference range applies only to samples taken after fasting for at least 8 hours.  CBC     Status: Abnormal   Collection Time: 05/10/23 12:52 AM  Result Value Ref Range   WBC 10.9 (H) 4.0 - 10.5 K/uL   RBC 3.81 (L) 4.22 - 5.81 MIL/uL   Hemoglobin 11.7 (L) 13.0 - 17.0 g/dL   HCT 16.1 (L) 09.6 - 04.5 %   MCV 97.1 80.0 - 100.0 fL   MCH 30.7 26.0 - 34.0 pg   MCHC 31.6 30.0 - 36.0 g/dL   RDW 40.9 81.1 - 91.4 %   Platelets 258 150 - 400 K/uL   nRBC 0.0 0.0 - 0.2 %    Comment: Performed at [facility], [address], [place], [place] [address]  Magnesium     Status: None   Collection Time: 05/10/23 12:52 AM  Result Value Ref  Range   Magnesium 2.0 1.7 - 2.4 mg/dL    Comment: Performed at [facility], [address] [place], [place] [address]  Comprehensive metabolic panel     Status: Abnormal   Collection Time: 05/10/23 12:52 AM  Result Value Ref Range   Sodium 137 135 - 145 mmol/L   Potassium 4.1 3.5 - 5.1 mmol/L   Chloride 111 98 - 111 mmol/L   CO2 18 (L) 22 - 32 mmol/L   Glucose, Bld 92 70 - 99 mg/dL    Comment: Glucose reference range applies only to samples taken after fasting for at least 8 hours.   BUN 17 8 - 23 mg/dL   Creatinine, Ser 8.65 (H) 0.61 - 1.24 mg/dL   Calcium 8.7 (L) 8.9 - 10.3 mg/dL   Total Protein 7.2 6.5 - 8.1 g/dL   Albumin 3.0 (L) 3.5 - 5.0 g/dL   AST 19 15 - 41 U/L   ALT 39 0 - 44 U/L   Alkaline Phosphatase 155 (H) 38 - 126 U/L   Total Bilirubin 0.6 0.3 - 1.2 mg/dL   GFR, Estimated 43 (L) >60 mL/min    Comment: (NOTE) Calculated using the CKD-EPI Creatinine Equation (2021)    Anion gap 8 5 - 15    Comment: Performed at [facility], [address], [place], [place] [address]  Glucose, capillary     Status: None   Collection Time: 05/10/23  4:51 AM  Result Value Ref Range   Glucose-Capillary 92 70 - 99 mg/dL    Comment: Glucose reference range applies only to samples taken after fasting for at least 8 hours.  Glucose, capillary     Status: None   Collection Time: 05/10/23  7:38 AM  Result Value Ref Range   Glucose-Capillary 86 70 -  99 mg/dL    Comment: Glucose reference range applies only to samples taken after fasting for at least 8 hours.  Glucose, capillary     Status: None   Collection Time: 05/10/23 11:43 AM  Result Value Ref Range   Glucose-Capillary 91 70 - 99 mg/dL    Comment: Glucose reference range applies only to samples taken after fasting for at least 8 hours.  Glucose, capillary     Status: None   Collection Time: 05/10/23 12:36 PM  Result Value Ref Range   Glucose-Capillary 84 70 - 99 mg/dL    Comment: Glucose reference range applies only  to samples taken after fasting for at least 8 hours.  Glucose, capillary     Status: Abnormal   Collection Time: 05/10/23  3:56 PM  Result Value Ref Range   Glucose-Capillary 131 (H) 70 - 99 mg/dL    Comment: Glucose reference range applies only to samples taken after fasting for at least 8 hours.  Basic metabolic panel     Status: Abnormal   Collection Time: 05/10/23  4:32 PM  Result Value Ref Range   Sodium 137 135 - 145 mmol/L   Potassium 3.4 (L) 3.5 - 5.1 mmol/L   Chloride 106 98 - 111 mmol/L   CO2 17 (L) 22 - 32 mmol/L   Glucose, Bld 139 (H) 70 - 99 mg/dL    Comment: Glucose reference range applies only to samples taken after fasting for at least 8 hours.   BUN 18 8 - 23 mg/dL   Creatinine, Ser 1.61 (H) 0.61 - 1.24 mg/dL   Calcium 9.0 8.9 - 09.6 mg/dL   GFR, Estimated 40 (L) >60 mL/min    Comment: (NOTE) Calculated using the CKD-EPI Creatinine Equation (2021)    Anion gap 14 5 - 15    Comment: Performed at [facility], [address], [place], [place] [address]  Magnesium     Status: None   Collection Time: 05/10/23  4:32 PM  Result Value Ref Range   Magnesium 2.0 1.7 - 2.4 mg/dL    Comment: Performed at [facility], [address] [place], [place] [address]  Glucose, capillary     Status: None   Collection Time: 05/10/23  7:56 PM  Result Value Ref Range   Glucose-Capillary 88 70 - 99 mg/dL    Comment: Glucose reference range applies only to samples taken after fasting for at least 8 hours.  Glucose, capillary     Status: Abnormal   Collection Time: 05/10/23 11:54 PM  Result Value Ref Range   Glucose-Capillary 119 (H) 70 - 99 mg/dL    Comment: Glucose reference range applies only to samples taken after fasting for at least 8 hours.  Glucose, capillary     Status: Abnormal   Collection Time: 05/11/23  4:25 AM  Result Value Ref Range   Glucose-Capillary 108 (H) 70 - 99 mg/dL    Comment: Glucose reference range applies only to samples taken after fasting  for at least 8 hours.  Glucose, capillary     Status: Abnormal   Collection Time: 05/11/23  8:11 AM  Result Value Ref Range   Glucose-Capillary 111 (H) 70 - 99 mg/dL    Comment: Glucose reference range applies only to samples taken after fasting for at least 8 hours.   Comment 1 Notify RN   Glucose, capillary     Status: Abnormal   Collection Time: 05/11/23 11:34 AM  Result Value Ref Range  Glucose-Capillary 103 (H) 70 - 99 mg/dL    Comment: Glucose reference range applies only to samples taken after fasting for at least 8 hours.   Comment 1 Notify RN    DG Abd 1 View  Result Date: 05/11/2023 CLINICAL DATA:  Abdominal distension and Ogilvie syndrome. EXAM: ABDOMEN - 1 VIEW COMPARISON:  05/10/2023 FINDINGS: Stable gaseous distension of the colon consistent with chronic Ogilvie syndrome and primarily affecting the transverse, descending and sigmoid colon. Maximum diameter of the colon is difficult to measure by x-ray but appears to be stable since 5/30. No gross evidence of free intraperitoneal air. 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 No gross evidence of free intraperitoneal air. Electronically Signed   By: Irish Lack M.D.   On: 05/11/2023 07:57   ECHOCARDIOGRAM COMPLETE  Result Date: 05/10/2023    ECHOCARDIOGRAM REPORT   Patient Name:   Terry Felkins Sr. Date of Exam: 05/10/2023 Medical Rec #:  [id number]        Height:       72.0 in Accession #:    [id number]       Weight:       246.9 lb Date of Birth:  1956/02/07        BSA:          2.330 m Patient Age:    67 years         BP:           152/82 mmHg Patient Gender: M                HR:           94 bpm. Exam Location:  Inpatient Procedure: 2D Echo, Cardiac Doppler and Color Doppler Indications:    Sinus tachycardia [[id number]]  History:        Patient has prior history of Echocardiogram examinations, most                 recent 05/12/2020. Stroke, Arrythmias:Tachycardia; Risk                  Factors:Hypertension, Diabetes, Non-Smoker and Dyslipidemia.  Sonographer:    Aron Baba Referring Phys: [id number] [facility] M PATEL  Sonographer Comments: No subcostal window. IMPRESSIONS  1. Left ventricular ejection fraction, by estimation, is 65 to 70%. The left ventricle has normal function. The left ventricle has no regional wall motion abnormalities. There is mild left ventricular hypertrophy. Left ventricular diastolic parameters are consistent with Grade I diastolic dysfunction (impaired relaxation).  2. Right ventricular systolic function is normal. The right ventricular size is normal.  3. The mitral valve is normal in structure. No evidence of mitral valve regurgitation. No evidence of mitral stenosis.  4. The aortic valve was not well visualized. Aortic valve regurgitation is not visualized. No aortic stenosis is present. FINDINGS  Left Ventricle: Left ventricular ejection fraction, by estimation, is 65 to 70%. The left ventricle has normal function. The left ventricle has no regional wall motion abnormalities. The left ventricular internal cavity size was normal in size. There is  mild left ventricular hypertrophy. Left ventricular diastolic parameters are consistent with Grade I diastolic dysfunction (impaired relaxation). Right Ventricle: The right ventricular size is normal. No increase in right ventricular wall thickness. Right ventricular systolic function is normal. Left Atrium: Left atrial size was normal in size. Right Atrium: Right atrial size was normal in size. Pericardium: There is no evidence of pericardial effusion. Presence of epicardial fat layer. Mitral  Valve: The mitral valve is normal in structure. No evidence of mitral valve regurgitation. No evidence of mitral valve stenosis. Tricuspid Valve: The tricuspid valve is normal in structure. Tricuspid valve regurgitation is trivial. Aortic Valve: The aortic valve was not well visualized. Aortic valve regurgitation is not visualized. No  aortic stenosis is present. Pulmonic Valve: The pulmonic valve was not well visualized. Pulmonic valve regurgitation is trivial. Aorta: The aortic root is normal in size and structure. IAS/Shunts: The interatrial septum was not well visualized.  LEFT VENTRICLE PLAX 2D LVIDd:         4.20 cm   Diastology LVIDs:         2.50 cm   LV e' medial:    4.30 cm/s LV PW:         1.10 cm   LV E/e' medial:  15.1 LV IVS:        0.90 cm   LV e' lateral:   8.43 cm/s LVOT diam:     2.10 cm   LV E/e' lateral: 7.7 LV SV:         67 LV SV Index:   29 LVOT Area:     3.46 cm  RIGHT VENTRICLE RV S prime:     13.90 cm/s TAPSE (M-mode): 2.4 cm LEFT ATRIUM           Index        RIGHT ATRIUM           Index LA diam:      2.90 cm 1.24 cm/m   RA Area:     18.30 cm LA Vol (A2C): 24.1 ml 10.34 ml/m  RA Volume:   51.00 ml  21.89 ml/m LA Vol (A4C): 32.8 ml 14.08 ml/m  AORTIC VALVE LVOT Vmax:   92.80 cm/s LVOT Vmean:  62.400 cm/s LVOT VTI:    0.192 m  AORTA Ao Root diam: 3.50 cm Ao Asc diam:  3.80 cm MITRAL VALVE               TRICUSPID VALVE MV Area (PHT): 2.84 cm    TR Peak grad:   5.3 mmHg MV Decel Time: 267 msec    TR Vmax:        115.00 cm/s MR Peak grad: 4.4 mmHg MR Vmax:      105.35 cm/s  SHUNTS MV E velocity: 65.00 cm/s  Systemic VTI:  0.19 m MV A velocity: 96.90 cm/s  Systemic Diam: 2.10 cm MV E/A ratio:  0.67 Epifanio Lesches MD Electronically signed by Epifanio Lesches MD Signature Date/Time: 05/10/2023/4:24:39 PM    Final    DG Abd 1 View  Result Date: 05/10/2023 CLINICAL DATA:  Abdominal distention EXAM: ABDOMEN - 1 VIEW COMPARISON:  Chest radiograph dated May 09, 2023 FINDINGS: Diffuse gaseous distention of the colon, unchanged when compared with the prior exam. Visualized lung bases are clear. No acute osseous abnormality. IMPRESSION: Diffuse gaseous distention of the colon, unchanged when compared with the prior exam. Electronically Signed   By: Allegra Lai M.D.   On: 05/10/2023 13:11   DG Abd 1  View  Result Date: 05/09/2023 CLINICAL DATA:  Distension EXAM: ABDOMEN - 1 VIEW COMPARISON:  05/07/2023 FINDINGS: Persistent marked gaseous distension of the:, most pronounced within the sigmoid region. No gross free intraperitoneal air on supine views. No radio-opaque calculi or other significant radiographic abnormality are seen. IMPRESSION: Persistent marked gaseous distension of the colon, most pronounced within the sigmoid region. Electronically Signed   By: Duanne Guess  D.O.   On: 05/09/2023 17:27      Assessment/Plan Ogilivie's syndrome   Patient seen and examined and relevant labs and imaging reviewed. He has been followed by hospitalist team and GI for recurrent Ogilivie's syndrome. He is unable to obtain part of the reccommended treatment regimen (Relistor) outpatient due to cost. He has improved with sigmoidoscopy with decompression in the past. He is currently improving inpatient with laxatives, Relistor, and liquid diet with decreased abdominal pain/distension and having bowel function. However due to concern for recurrence we were asked to evaluate for diversion. Due to essentially nonfunctional colon patient would require ileostomy for adequate diversion. Discussed at length the risks/benefits of diverting ileostomy with patient as well as expected long term care for ileostomy including but not limited to risks of general anesthesia, wound infection, injury to surrounding structures, high output ileostomy.  Would recommend exhausting all medical therapies prior to considering ileostomy in this patient given CKD and risk of dehydration if he developed high output ileostomy.   General surgery will not follow acutely but are available as needed if not improving or other concerns.   FEN: FLD ID: none VTE: okay for chemical prophylaxis from surgical standpoint  I reviewed Consultant GI notes, hospitalist notes, last 24 h vitals and pain scores, last 48 h intake and output, last 24 h  labs and trends, and last 24 h imaging results.   Trixie Deis, [facility] 05/11/2023, 11:57 AM Please see Amion for pager number during day hours 7:00am-4:30pm

## 2023-05-11 NOTE — Progress Notes (Signed)
[facility] Progress Note Patient: Terry Manwarren Sr. ZOX:[id number] DOB: 1956/01/22 DOA: 05/07/2023  DOS: the patient was seen and examined on 05/11/2023  Brief hospital course: PMH of paraplegia, CVA, HTN, HLD, CKD 3A, PVD presents with complaints of abdominal distention secondary to recurrent Oglive syndrome.  GI following. Assessment and Plan: Recurrent Ogilvie's syndrome [facility] consulted Patient was not taking Relistor on discharge. Recently hospitalized for the same and underwent decompressive sigmoidoscopy. Also had a rectal tube and stent placed. Currently being treated conservatively with IV Relistor, simethicone and bowel regimen. General surgery consulted for further assistance for possible diverticular ostomy.  Currently recommend to consider medical therapies as patient remains at very high risk of further dehydration and worsening of his CKD.   Essential hypertension Blood pressure stable. Continuing amlodipine.   Hyperlipidemia Holding statin for now.   Hypothyroidism Continue Synthroid.   CKD 3B. Baseline creatinine around 1.7-1.9. Remaining stable. Monitor.   Hypokalemia. Hypomagnesemia. Replacing aggressively.  Initiating scheduled therapies. Maintain K more than 4 and mag more than 2.   Rectal polyp Outpatient follow-up with GI.   Type 2 diabetes mellitus, uncontrolled with hyperglycemia without long-term insulin use. Monitor for now.  Sliding scale insulin.   Normocytic anemia H&H stable.  Monitor.  Obesity Body mass index is 33.49 kg/m.  Placing the pt at higher risk of poor outcomes.   Subjective: N abdomen remained stable.  Had a BM.  Passing gas.  No nausea no vomiting.  Physical Exam: S1-S2 present. Bowel sound present. Nontender. Distended but appears to be less compared to yesterday.  Data Reviewed: I have Reviewed nursing notes, Vitals, and Lab results. Reviewed CBC and BMP.  Reordered CBC and BMP.  Disposition: Status is:  Inpatient Remains inpatient appropriate because: Need improvement in abdominal distention  SCDs Start: 05/08/23 0055   Family Communication: Family at bedside. Level of care: Telemetry Medical   Vitals:   05/10/23 1941 05/11/23 0431 05/11/23 0715 05/11/23 1542  BP: (!) 173/90 (!) 171/81 139/83 (!) 153/72  Pulse:  66 71 71  Resp:  16 16 16   Temp: 98.2 F (36.8 C) 98.1 F (36.7 C) 98.3 F (36.8 C) 97.8 F (36.6 C)  TempSrc: Oral Oral Oral Oral  SpO2: 100% 98% 100% 100%  Weight:      Height:         Author: Lynden Oxford, MD 05/11/2023 6:18 PM  Please look on [email or web address] to find out who is on call.

## 2023-05-11 NOTE — Progress Notes (Addendum)
Patient ID: Terry Pock Sr., male   DOB: Oct 09, 1956, 67 y.o.   MRN: [id number]     Attending physician's note   I have taken a history, reviewed the chart, and examined the patient. I performed a substantive portion of this encounter, including complete performance of at least one of the key components, in conjunction with the APP. I agree with the APP's note, impression, and recommendations with my edits.   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  Recommending continued medical therapy instead of surgery. - Will continue with Relistor - Continue with bowel regimen as outlined - Again contemplating neostigmine as we had during the last admission.  Previously discussed risk of arrhythmia, bradycardia, etc. and would need to be in a monitored setting with atropine at bedside when administering.  Again, this would only be a temporizing solution - Repeat sigmoidoscopy with rectal decompression tube if symptoms worsen/not responding to therapy.  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  He is currently on full liquids and tolerating, he did have a liquid stool yesterday afternoon, none since  Abdominal films today-stable gaseous distention of the colon consistent with chronic Ogilvie syndrome   Objective   Vital signs in last 24 hours: Temp:  [97.7 F (36.5 C)-98.3 F (36.8 C)] 98.3 F (36.8 C) (06/03 0715) Pulse Rate:  [66-71] 71 (06/03 0715) Resp:   [16-17] 16 (06/03 0715) BP: (139-173)/(79-90) 139/83 (06/03 0715) SpO2:  [98 %-100 %] 100 % (06/03 0715) Last BM Date : 05/10/23 General:    Older African-American male in NAD Heart:  Regular rate and rhythm; no murmurs Lungs: Respirations even and unlabored, lungs CTA bilaterally Abdomen: Distended abdomen, there is a palpable large loop of bowel in the mid abdomen, bowel sounds decreased in tinkling nontender Extremities:  Without edema. Neurologic:  Alert and oriented,  grossly normal neurologically. Psych:  Cooperative. Normal mood and affect.  Intake/Output from previous day: 06/02 0701 - 06/03 0700 In: 480 [P.O.:480] Out: 200 [Urine:200] Intake/Output this shift: No intake/output data recorded.  Lab Results: Recent Labs    05/09/23 0043 05/10/23 0052  WBC 12.0* 10.9*  HGB 11.0* 11.7*  HCT 35.3* 37.0*  PLT 240 258   BMET Recent Labs    05/09/23 1623 05/10/23 0052 05/10/23 1632  NA 138 137 137  K 3.1* 4.1 3.4*  CL 108 111 106  CO2 20* 18* 17*  GLUCOSE 143* 92 139*  BUN 18 17 18   CREATININE 1.74* 1.70* 1.82*  CALCIUM 8.8* 8.7* 9.0   LFT Recent Labs    05/10/23 0052  PROT 7.2  ALBUMIN 3.0*  AST 19  ALT 39  ALKPHOS 155*  BILITOT 0.6   PT/INR No results for input(s): "LABPROT", "INR" in the last 72 hours.  Studies/Results: DG Abd 1 View  Result Date: 05/11/2023 CLINICAL DATA:  Abdominal distension and Ogilvie syndrome. EXAM: ABDOMEN - 1 VIEW COMPARISON:  05/10/2023 FINDINGS: Stable gaseous distension of the colon consistent with chronic  Ogilvie syndrome and primarily affecting the transverse, descending and sigmoid colon. Maximum diameter of the colon is difficult to measure by x-ray but appears to be stable since 5/30. No gross evidence of free intraperitoneal air. 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 No gross evidence of free intraperitoneal air. Electronically Signed   By: Irish Lack M.D.   On:  05/11/2023 07:57   ECHOCARDIOGRAM COMPLETE  Result Date: 05/10/2023    ECHOCARDIOGRAM REPORT   Patient Name:   Terry Hempel Sr. Date of Exam: 05/10/2023 Medical Rec #:  [id number]        Height:       72.0 in Accession #:    [id number]       Weight:       246.9 lb Date of Birth:  1956-01-17        BSA:          2.330 m Patient Age:    67 years         BP:           152/82 mmHg Patient Gender: M                HR:           94 bpm. Exam Location:  Inpatient Procedure: 2D Echo, Cardiac Doppler and Color Doppler Indications:    Sinus tachycardia [[id number]]  History:        Patient has prior history of Echocardiogram examinations, most                 recent 05/12/2020. Stroke, Arrythmias:Tachycardia; Risk                 Factors:Hypertension, Diabetes, Non-Smoker and Dyslipidemia.  Sonographer:    Aron Baba Referring Phys: [id number] [facility] M PATEL  Sonographer Comments: No subcostal window. IMPRESSIONS  1. Left ventricular ejection fraction, by estimation, is 65 to 70%. The left ventricle has normal function. The left ventricle has no regional wall motion abnormalities. There is mild left ventricular hypertrophy. Left ventricular diastolic parameters are consistent with Grade I diastolic dysfunction (impaired relaxation).  2. Right ventricular systolic function is normal. The right ventricular size is normal.  3. The mitral valve is normal in structure. No evidence of mitral valve regurgitation. No evidence of mitral stenosis.  4. The aortic valve was not well visualized. Aortic valve regurgitation is not visualized. No aortic stenosis is present. FINDINGS  Left Ventricle: Left ventricular ejection fraction, by estimation, is 65 to 70%. The left ventricle has normal function. The left ventricle has no regional wall motion abnormalities. The left ventricular internal cavity size was normal in size. There is  mild left ventricular hypertrophy. Left ventricular diastolic parameters are consistent with Grade I diastolic  dysfunction (impaired relaxation). Right Ventricle: The right ventricular size is normal. No increase in right ventricular wall thickness. Right ventricular systolic function is normal. Left Atrium: Left atrial size was normal in size. Right Atrium: Right atrial size was normal in size. Pericardium: There is no evidence of pericardial effusion. Presence of epicardial fat layer. Mitral Valve: The mitral valve is normal in structure. No evidence of mitral valve regurgitation. No evidence of mitral valve stenosis. Tricuspid Valve: The tricuspid valve is normal in structure. Tricuspid valve regurgitation is trivial. Aortic Valve: The aortic valve was not well visualized. Aortic valve regurgitation is not visualized. No aortic stenosis is present. Pulmonic Valve: The pulmonic valve was not well visualized. Pulmonic valve regurgitation is trivial.  Aorta: The aortic root is normal in size and structure. IAS/Shunts: The interatrial septum was not well visualized.  LEFT VENTRICLE PLAX 2D LVIDd:         4.20 cm   Diastology LVIDs:         2.50 cm   LV e' medial:    4.30 cm/s LV PW:         1.10 cm   LV E/e' medial:  15.1 LV IVS:        0.90 cm   LV e' lateral:   8.43 cm/s LVOT diam:     2.10 cm   LV E/e' lateral: 7.7 LV SV:         67 LV SV Index:   29 LVOT Area:     3.46 cm  RIGHT VENTRICLE RV S prime:     13.90 cm/s TAPSE (M-mode): 2.4 cm LEFT ATRIUM           Index        RIGHT ATRIUM           Index LA diam:      2.90 cm 1.24 cm/m   RA Area:     18.30 cm LA Vol (A2C): 24.1 ml 10.34 ml/m  RA Volume:   51.00 ml  21.89 ml/m LA Vol (A4C): 32.8 ml 14.08 ml/m  AORTIC VALVE LVOT Vmax:   92.80 cm/s LVOT Vmean:  62.400 cm/s LVOT VTI:    0.192 m  AORTA Ao Root diam: 3.50 cm Ao Asc diam:  3.80 cm MITRAL VALVE               TRICUSPID VALVE MV Area (PHT): 2.84 cm    TR Peak grad:   5.3 mmHg MV Decel Time: 267 msec    TR Vmax:        115.00 cm/s MR Peak grad: 4.4 mmHg MR Vmax:      105.35 cm/s  SHUNTS MV E velocity: 65.00 cm/s   Systemic VTI:  0.19 m MV A velocity: 96.90 cm/s  Systemic Diam: 2.10 cm MV E/A ratio:  0.67 Epifanio Lesches MD Electronically signed by Epifanio Lesches MD Signature Date/Time: 05/10/2023/4:24:39 PM    Final    DG Abd 1 View  Result Date: 05/10/2023 CLINICAL DATA:  Abdominal distention EXAM: ABDOMEN - 1 VIEW COMPARISON:  Chest radiograph dated May 09, 2023 FINDINGS: Diffuse gaseous distention of the colon, unchanged when compared with the prior exam. Visualized lung bases are clear. No acute osseous abnormality. IMPRESSION: Diffuse gaseous distention of the colon, unchanged when compared with the prior exam. Electronically Signed   By: Allegra Lai M.D.   On: 05/10/2023 13:11   DG Abd 1 View  Result Date: 05/09/2023 CLINICAL DATA:  Distension EXAM: ABDOMEN - 1 VIEW COMPARISON:  05/07/2023 FINDINGS: Persistent marked gaseous distension of the:, most pronounced within the sigmoid region. No gross free intraperitoneal air on supine views. No radio-opaque calculi or other significant radiographic abnormality are seen. IMPRESSION: Persistent marked gaseous distension of the colon, most pronounced within the sigmoid region. Electronically Signed   By: Duanne Guess D.O.   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  He underwent flexible sigmoidoscopy with decompression x 2 during  that admission and was treated with laxatives and Relistor. Unfortunately Relistor usually too expensive to continue on an outpatient basis, and patient was unable to obtain-readmitted 4 days ago  He has been managed conservatively with liquid diet, laxatives and Relistor x 2  Imaging today is stable, and clinically abdomen is  less distended and patient is comfortable.  #2 chronic kidney disease #3 diabetes mellitus #4 hypertension #5 hypothyroid # 6 hypokalemia and hypomagnesemia correcting  Plan; continue full liquid diet with supplements Will give subcu Relistor today 6 mg Continue twice daily MiraLAX Holding off on sigmoidoscopy as that has been only temporizing in the past We will ask surgery to see today for consideration of diverting colostomy This was discussed as a possibility with the patient today and he is willing to at least consider.    Principal Problem:   Ogilvie's syndrome Active Problems:   Essential hypertension   Hypokalemia   Diabetes mellitus (HCC)   Hypothyroidism   Stage 3b chronic kidney disease (CKD) (HCC)   Pressure injury of skin   Colon distention     LOS: 3 days   Amy Esterwood PA-C  05/11/2023, 10:53 AM

## 2023-05-12 ENCOUNTER — Inpatient Hospital Stay (HOSPITAL_COMMUNITY): Payer: Medicare HMO

## 2023-05-12 DIAGNOSIS — E119 Type 2 diabetes mellitus without complications: Secondary | ICD-10-CM

## 2023-05-12 DIAGNOSIS — K5981 Ogilvie syndrome: Secondary | ICD-10-CM | POA: Diagnosis not present

## 2023-05-12 DIAGNOSIS — I10 Essential (primary) hypertension: Secondary | ICD-10-CM | POA: Diagnosis not present

## 2023-05-12 DIAGNOSIS — E876 Hypokalemia: Secondary | ICD-10-CM | POA: Diagnosis not present

## 2023-05-12 LAB — BASIC METABOLIC PANEL
Anion gap: 11 (ref 5–15)
BUN: 21 mg/dL (ref 8–23)
CO2: 16 mmol/L — ABNORMAL LOW (ref 22–32)
Calcium: 9 mg/dL (ref 8.9–10.3)
Chloride: 109 mmol/L (ref 98–111)
Creatinine, Ser: 2.04 mg/dL — ABNORMAL HIGH (ref 0.61–1.24)
GFR, Estimated: 35 mL/min — ABNORMAL LOW (ref >60)
Glucose, Bld: 102 mg/dL — ABNORMAL HIGH (ref 70–99)
Potassium: 3.6 mmol/L (ref 3.5–5.1)
Sodium: 136 mmol/L (ref 135–145)

## 2023-05-12 LAB — GLUCOSE, CAPILLARY
Glucose-Capillary: 100 mg/dL — ABNORMAL HIGH (ref 70–99)
Glucose-Capillary: 108 mg/dL — ABNORMAL HIGH (ref 70–99)
Glucose-Capillary: 113 mg/dL — ABNORMAL HIGH (ref 70–99)
Glucose-Capillary: 121 mg/dL — ABNORMAL HIGH (ref 70–99)
Glucose-Capillary: 91 mg/dL (ref 70–99)

## 2023-05-12 LAB — MAGNESIUM: Magnesium: 1.9 mg/dL (ref 1.7–2.4)

## 2023-05-12 MED ORDER — HYDRALAZINE HCL 25 MG PO TABS
25.0000 mg | ORAL_TABLET | Freq: Three times a day (TID) | ORAL | Status: DC
  Administered 2023-05-13 – 2023-05-15 (×7): 25 mg via ORAL
  Filled 2023-05-12 (×7): qty 1

## 2023-05-12 MED ORDER — HYDRALAZINE HCL 25 MG PO TABS: 25.0000 mg | ORAL_TABLET | Freq: Four times a day (QID) | ORAL | Status: DC

## 2023-05-12 MED ORDER — METHYLNALTREXONE BROMIDE 12 MG/0.6ML ~~LOC~~ SOLN
6.0000 mg | Freq: Once | SUBCUTANEOUS | Status: AC
  Administered 2023-05-12: 6 mg via SUBCUTANEOUS
  Filled 2023-05-12: qty 0.6

## 2023-05-12 MED ORDER — LACTATED RINGERS IV SOLN: INTRAVENOUS | Status: AC

## 2023-05-12 MED ORDER — INSULIN ASPART 100 UNIT/ML IJ SOLN
0.0000 [IU] | Freq: Three times a day (TID) | INTRAMUSCULAR | Status: DC
  Administered 2023-05-14 – 2023-05-15 (×3): 1 [IU] via SUBCUTANEOUS

## 2023-05-12 MED ORDER — SENNA 8.6 MG PO TABS
2.0000 | ORAL_TABLET | Freq: Every day | ORAL | Status: DC
  Administered 2023-05-12 – 2023-05-14 (×3): 17.2 mg via ORAL
  Filled 2023-05-12 (×3): qty 2

## 2023-05-12 MED ORDER — POTASSIUM CHLORIDE CRYS ER 20 MEQ PO TBCR
40.0000 meq | EXTENDED_RELEASE_TABLET | Freq: Once | ORAL | Status: AC
  Administered 2023-05-12: 40 meq via ORAL
  Filled 2023-05-12: qty 2

## 2023-05-12 MED ORDER — POTASSIUM CHLORIDE CRYS ER 20 MEQ PO TBCR
30.0000 meq | EXTENDED_RELEASE_TABLET | Freq: Two times a day (BID) | ORAL | Status: DC
  Administered 2023-05-13: 30 meq via ORAL
  Filled 2023-05-12: qty 1

## 2023-05-12 MED ORDER — LINACLOTIDE 145 MCG PO CAPS
290.0000 ug | ORAL_CAPSULE | Freq: Every day | ORAL | Status: DC
  Administered 2023-05-13 – 2023-05-15 (×3): 290 ug via ORAL
  Filled 2023-05-12 (×4): qty 2

## 2023-05-12 MED ORDER — INSULIN ASPART 100 UNIT/ML IJ SOLN: 0.0000 [IU] | Freq: Every day | INTRAMUSCULAR | Status: DC

## 2023-05-12 MED ORDER — METHYLNALTREXONE BROMIDE 12 MG/0.6ML ~~LOC~~ SOLN
6.0000 mg | SUBCUTANEOUS | Status: AC
  Filled 2023-05-12: qty 0.6

## 2023-05-12 NOTE — Progress Notes (Signed)
[facility] Progress Note Patient: Terry Balko Sr. ZOX:[id number] DOB: 1956-06-05 DOA: 05/07/2023  DOS: the patient was seen and examined on 05/12/2023  Brief hospital course: PMH of paraplegia, CVA, HTN, HLD, CKD 3A, PVD presents with complaints of abdominal distention secondary to recurrent Oglive syndrome.  GI following. Assessment and Plan: Recurrent Ogilvie's syndrome [facility] consulted Patient was not taking Relistor on discharge. Recently hospitalized for the same and underwent decompressive sigmoidoscopy. Also had a rectal tube and stent placed. Currently being treated conservatively with IV Relistor, simethicone and bowel regimen. General surgery consulted for further assistance for possible diverticular ostomy.  Currently recommend to consider medical therapies as patient remains at very high risk of further dehydration and worsening of his CKD. Plan for flexible sigmoidoscopy tomorrow.   Essential hypertension Blood pressure stable. Patient is on amlodipine at home.  There is theoretical risk of impacting smooth muscle movement and therefore will discontinue amlodipine. Switch to hydralazine.   Hyperlipidemia Holding statin for now.   Hypothyroidism Continue Synthroid.   CKD 3B. Baseline creatinine around 1.7-1.9. Remaining stable. Monitor.   Hypokalemia. Hypomagnesemia. Replacing aggressively.  Initiating scheduled therapies. Maintain K more than 4 and mag more than 2.   Rectal polyp Outpatient follow-up with GI.   Type 2 diabetes mellitus, uncontrolled with hyperglycemia without long-term insulin use. Monitor for now.  Sliding scale insulin.   Normocytic anemia H&H stable.  Monitor.  Obesity Body mass index is 32.86 kg/m.  Placing the pt at higher risk of poor outcomes.   Subjective: Abdomen stable.  No nausea or vomiting.  Still distended.  Had a BM.  Tolerating oral diet.  Physical Exam: Bowel sound seen.  Distended.  Nontender. No  edema. Chronic  paraplegia from prior stroke.  Data Reviewed: I have Reviewed nursing notes, Vitals, and Lab results. Reviewed recent BMP.  Reordered BMP and magnesium.  Disposition: Status is: Inpatient Remains inpatient appropriate because: Need improvement in abdominal distention  SCDs Start: 05/08/23 0055   Family Communication: No one at bedside. Level of care: Telemetry Medical   Vitals:   05/12/23 0645 05/12/23 0816 05/12/23 1619 05/12/23 1954  BP:  135/71 137/72 (!) 176/76  Pulse:  67 72 69  Resp:  17 17 19   Temp:  98.5 F (36.9 C) 98.1 F (36.7 C) 97.7 F (36.5 C)  TempSrc:  Oral Oral Oral  SpO2:  100% 100% 99%  Weight: 109.9 kg     Height:         Author: Lynden Oxford, MD 05/12/2023 8:45 PM  Please look on [email or web address] to find out who is on call.

## 2023-05-12 NOTE — Plan of Care (Signed)

## 2023-05-12 NOTE — H&P (View-Only) (Signed)
**Note Terry-Identified via Obfuscation** Patient ID: Terry Bassin Sr., male   DOB: 11/30/1956, 67 y.o.   MRN: [id number]    Progress Note   Subjective   Day # 5  CC; colonic distention/Ogilvie syndrome  Relistor ordered yesterday but patient did not receive-reordered for today  Abdominal films today-no significant change from prior diffuse gaseous distention of large and small bowel suggesting colonic ileus/Ogilvie syndrome sigmoid distention difficult to measure due to bowel overlap but measures at least 17.3 cm  Patient says he is comfortable, no abdominal pain does not sense abdominal distention or tightness, tolerating full liquid diet though does not like most of the full liquids, says he did have 1 bowel movement today     Objective   Vital signs in last 24 hours: Temp:  [97.8 F (36.6 C)-98.5 F (36.9 C)] 98.5 F (36.9 C) (06/04 0816) Pulse Rate:  [65-71] 67 (06/04 0816) Resp:  [16-22] 17 (06/04 0816) BP: (135-155)/(70-72) 135/71 (06/04 0816) SpO2:  [99 %-100 %] 100 % (06/04 0816) Weight:  [109.9 kg] 109.9 kg (06/04 0645) Last BM Date : 05/11/23 General:    older AA male in NAD- son at bedside Heart:  Regular rate and rhythm; no murmurs Lungs: Respirations even and unlabored, lungs CTA bilaterally Abdomen: distended palpable distended loop of bowel  in central abd  BS  tinkling, Extremities:  Without edema. Neurologic:  Alert and oriented,  paraplegia Psych:  Cooperative. Normal mood and affect.  Intake/Output from previous day: 06/03 0701 - 06/04 0700 In: 120 [P.O.:120] Out: -  Intake/Output this shift: Total I/O In: 120 [P.O.:120] Out: 300 [Urine:300]  Lab Results: Recent Labs    05/10/23 0052 05/11/23 1246  WBC 10.9* 10.9*  HGB 11.7* 11.0*  HCT 37.0* 34.7*  PLT 258 288   BMET Recent Labs    05/11/23 1023 05/11/23 1731 05/12/23 0804  NA 137 136 136  K 3.2* 3.1* 3.6  CL 109 109 109  CO2 18* 18* 16*  GLUCOSE 105* 145* 102*  BUN 17 19 21  CREATININE 1.87* 1.91* 2.04*  CALCIUM 8.9 8.9  9.0   LFT Recent Labs    05/11/23 1023  PROT 6.7  ALBUMIN 2.9*  AST 26  ALT 33  ALKPHOS 149*  BILITOT 0.6  BILIDIR 0.2  IBILI 0.4   PT/INR No results for input(s): "LABPROT", "INR" in the last 72 hours.  Studies/Results: DG Abd 2 Views  Result Date: 05/12/2023 CLINICAL DATA:  Ileus. EXAM: ABDOMEN - 2 VIEW COMPARISON:  Multiple radiographs most recently yesterday. CT 05/07/2023 FINDINGS: Diffuse gaseous distention of large and small bowel. Sigmoid colonic distention is difficult to measure due to diffuse bowel overlap, however measures at least 17.3 cm. Small bowel distention to a lesser degree. No obvious pneumatosis or free air. No air in the rectum. IMPRESSION: 1. No significant change from prior. Diffuse gaseous distention of large and small bowel, suggesting colonic ileus/Ogilvie syndrome. Sigmoid colonic distention is difficult to measure due to diffuse bowel overlap, however measures at least 17.3 cm. 2. No evidence of free air or pneumatosis. Electronically Signed   By: Melanie  Sanford M.D.   On: 05/12/2023 11:33   DG Abd 1 View  Result Date: 05/11/2023 CLINICAL DATA:  Abdominal distension and Ogilvie syndrome. EXAM: ABDOMEN - 1 VIEW COMPARISON:  05/10/2023 FINDINGS: Stable gaseous distension of the colon consistent with chronic Ogilvie syndrome and primarily affecting the transverse, descending and sigmoid colon. Maximum diameter of the colon is difficult to measure by x-ray but appears to be stable since   5/30. No gross evidence of free intraperitoneal air. 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 No gross evidence of free intraperitoneal air. Electronically Signed   By: Glenn  Yamagata M.D.   On: 05/11/2023 07:57   ECHOCARDIOGRAM COMPLETE  Result Date: 05/10/2023    ECHOCARDIOGRAM REPORT   Patient Name:   Terry Garceau Sr. Date of Exam: 05/10/2023 Medical Rec #:  [id number]        Height:       72.0 in Accession #:    [id number]        Weight:       246.9 lb Date of Birth:  02/04/1956        BSA:          2.330 m Patient Age:    67 years         BP:           152/82 mmHg Patient Gender: M                HR:           94 bpm. Exam Location:  Inpatient Procedure: 2D Echo, Cardiac Doppler and Color Doppler Indications:    Sinus tachycardia [[id number]]  History:        Patient has prior history of Echocardiogram examinations, most                 recent 05/12/2020. Stroke, Arrythmias:Tachycardia; Risk                 Factors:Hypertension, Diabetes, Non-Smoker and Dyslipidemia.  Sonographer:    Norma Walker Referring Phys: [id number] PRANAV M PATEL  Sonographer Comments: No subcostal window. IMPRESSIONS  1. Left ventricular ejection fraction, by estimation, is 65 to 70%. The left ventricle has normal function. The left ventricle has no regional wall motion abnormalities. There is mild left ventricular hypertrophy. Left ventricular diastolic parameters are consistent with Grade I diastolic dysfunction (impaired relaxation).  2. Right ventricular systolic function is normal. The right ventricular size is normal.  3. The mitral valve is normal in structure. No evidence of mitral valve regurgitation. No evidence of mitral stenosis.  4. The aortic valve was not well visualized. Aortic valve regurgitation is not visualized. No aortic stenosis is present. FINDINGS  Left Ventricle: Left ventricular ejection fraction, by estimation, is 65 to 70%. The left ventricle has normal function. The left ventricle has no regional wall motion abnormalities. The left ventricular internal cavity size was normal in size. There is  mild left ventricular hypertrophy. Left ventricular diastolic parameters are consistent with Grade I diastolic dysfunction (impaired relaxation). Right Ventricle: The right ventricular size is normal. No increase in right ventricular wall thickness. Right ventricular systolic function is normal. Left Atrium: Left atrial size was normal in size. Right  Atrium: Right atrial size was normal in size. Pericardium: There is no evidence of pericardial effusion. Presence of epicardial fat layer. Mitral Valve: The mitral valve is normal in structure. No evidence of mitral valve regurgitation. No evidence of mitral valve stenosis. Tricuspid Valve: The tricuspid valve is normal in structure. Tricuspid valve regurgitation is trivial. Aortic Valve: The aortic valve was not well visualized. Aortic valve regurgitation is not visualized. No aortic stenosis is present. Pulmonic Valve: The pulmonic valve was not well visualized. Pulmonic valve regurgitation is trivial. Aorta: The aortic root is normal in size and structure. IAS/Shunts: The interatrial septum was not well visualized.  LEFT VENTRICLE PLAX 2D LVIDd:           4.20 cm   Diastology LVIDs:         2.50 cm   LV e' medial:    4.30 cm/s LV PW:         1.10 cm   LV E/e' medial:  15.1 LV IVS:        0.90 cm   LV e' lateral:   8.43 cm/s LVOT diam:     2.10 cm   LV E/e' lateral: 7.7 LV SV:         67 LV SV Index:   29 LVOT Area:     3.46 cm  RIGHT VENTRICLE RV S prime:     13.90 cm/s TAPSE (M-mode): 2.4 cm LEFT ATRIUM           Index        RIGHT ATRIUM           Index LA diam:      2.90 cm 1.24 cm/m   RA Area:     18.30 cm LA Vol (A2C): 24.1 ml 10.34 ml/m  RA Volume:   51.00 ml  21.89 ml/m LA Vol (A4C): 32.8 ml 14.08 ml/m  AORTIC VALVE LVOT Vmax:   92.80 cm/s LVOT Vmean:  62.400 cm/s LVOT VTI:    0.192 m  AORTA Ao Root diam: 3.50 cm Ao Asc diam:  3.80 cm MITRAL VALVE               TRICUSPID VALVE MV Area (PHT): 2.84 cm    TR Peak grad:   5.3 mmHg MV Decel Time: 267 msec    TR Vmax:        115.00 cm/s MR Peak grad: 4.4 mmHg MR Vmax:      105.35 cm/s  SHUNTS MV E velocity: 65.00 cm/s  Systemic VTI:  0.19 m MV A velocity: 96.90 cm/s  Systemic Diam: 2.10 cm MV E/A ratio:  0.67 Christopher Schumann MD Electronically signed by Christopher Schumann MD Signature Date/Time: 05/10/2023/4:24:39 PM    Final        Assessment /  Plan:    #1 67-year-old African-American male admitted 5 days ago, second admission in the past month for recurrent severe colonic distention/Ogilvie's. Patient underwent sigmoidoscopy with decompression x 2 during last admission, was also managed with Relistor, MiraLAX and Dulcolax.  This admission thus far has not had repeat decompression as this had only been temporizing.  He has received Relistor x 3, will give another dose today, continue MiraLAX twice daily.  Continue Dulcolax 2 daily  Sigmoid measuring over 17 cm on imaging today-May be forced to do another flexible sigmoidoscopy with decompression tomorrow, will discuss with MD.  I did discuss this with patient and he is agreeable if needed  .  Will give him some tapwater enemas this afternoon to try to aid with decompression  Was seen by surgery yesterday, consult appreciated.  Surgery is not in favor of diverting colostomy, however this certainly has been used in the past with good success for these patients.  Understanding that it does not fix the underlying motility but certainly offer a good palliation to vent the bowel.  Will keep him n.p.o. after midnight for possible flex with decompression tomorrow We will plan to keep him on Relistor daily while here Will also start Linzess 290 mcg daily, Motegrity would be better but not on formulary and unlikely his insurance will cover       Principal Problem:   Ogilvie's syndrome Active Problems:   Essential hypertension     Hypokalemia   Diabetes mellitus (HCC)   Hypothyroidism   Stage 3b chronic kidney disease (CKD) (HCC)   Pressure injury of skin   Colon distention     LOS: 4 days   Amy Esterwood PA-C 05/12/2023, 12:21 PM    Attending physician's note   I have taken a history, reviewed the chart and examined the patient. I performed a substantive portion of this encounter, including complete performance of at least one of the key components, in conjunction with the APP. I  agree with the APP's note, impression and recommendations.    Abdomen is firm with severe distention secondary to colonic ileus/Ogilvie syndrome Turn in bed from side-to-side every 3-4 hours Rectal tube Use Relistor daily Will plan for flexible sigmoidoscopy tomorrow for decompression if he continues to have significant distention  The patient was provided an opportunity to ask questions and all were answered. The patient agreed with the plan and demonstrated an understanding of the instructions.   K. Veena Trannie Bardales , MD [phone]    

## 2023-05-12 NOTE — Progress Notes (Addendum)
Patient ID: Terry Wadding Sr., male   DOB: 11-18-1956, 67 y.o.   MRN: [id number]    Progress Note   Subjective   Day # 5  CC; colonic distention/Ogilvie syndrome  Relistor ordered yesterday but patient did not receive-reordered for today  Abdominal films today-no significant change from prior diffuse gaseous distention of large and small bowel suggesting colonic ileus/Ogilvie syndrome sigmoid distention difficult to measure due to bowel overlap but measures at least 17.3 cm  Patient says he is comfortable, no abdominal pain does not sense abdominal distention or tightness, tolerating full liquid diet though does not like most of the full liquids, says he did have 1 bowel movement today     Objective   Vital signs in last 24 hours: Temp:  [97.8 F (36.6 C)-98.5 F (36.9 C)] 98.5 F (36.9 C) (06/04 0816) Pulse Rate:  [65-71] 67 (06/04 0816) Resp:  [16-22] 17 (06/04 0816) BP: (135-155)/(70-72) 135/71 (06/04 0816) SpO2:  [99 %-100 %] 100 % (06/04 0816) Weight:  [109.9 kg] 109.9 kg (06/04 0645) Last BM Date : 05/11/23 General:    older AA male in NAD- son at bedside Heart:  Regular rate and rhythm; no murmurs Lungs: Respirations even and unlabored, lungs CTA bilaterally Abdomen: distended palpable distended loop of bowel  in central abd  BS  tinkling, Extremities:  Without edema. Neurologic:  Alert and oriented,  paraplegia Psych:  Cooperative. Normal mood and affect.  Intake/Output from previous day: 06/03 0701 - 06/04 0700 In: 120 [P.O.:120] Out: -  Intake/Output this shift: Total I/O In: 120 [P.O.:120] Out: 300 [Urine:300]  Lab Results: Recent Labs    05/10/23 0052 05/11/23 1246  WBC 10.9* 10.9*  HGB 11.7* 11.0*  HCT 37.0* 34.7*  PLT 258 288   BMET Recent Labs    05/11/23 1023 05/11/23 1731 05/12/23 0804  NA 137 136 136  K 3.2* 3.1* 3.6  CL 109 109 109  CO2 18* 18* 16*  GLUCOSE 105* 145* 102*  BUN 17 19 21   CREATININE 1.87* 1.91* 2.04*  CALCIUM 8.9 8.9  9.0   LFT Recent Labs    05/11/23 1023  PROT 6.7  ALBUMIN 2.9*  AST 26  ALT 33  ALKPHOS 149*  BILITOT 0.6  BILIDIR 0.2  IBILI 0.4   PT/INR No results for input(s): "LABPROT", "INR" in the last 72 hours.  Studies/Results: DG Abd 2 Views  Result Date: 05/12/2023 CLINICAL DATA:  Ileus. EXAM: ABDOMEN - 2 VIEW COMPARISON:  Multiple radiographs most recently yesterday. CT 05/07/2023 FINDINGS: Diffuse gaseous distention of large and small bowel. Sigmoid colonic distention is difficult to measure due to diffuse bowel overlap, however measures at least 17.3 cm. Small bowel distention to a lesser degree. No obvious pneumatosis or free air. No air in the rectum. IMPRESSION: 1. No significant change from prior. Diffuse gaseous distention of large and small bowel, suggesting colonic ileus/Ogilvie syndrome. Sigmoid colonic distention is difficult to measure due to diffuse bowel overlap, however measures at least 17.3 cm. 2. No evidence of free air or pneumatosis. Electronically Signed   By: Narda Rutherford M.D.   On: 05/12/2023 11:33   DG Abd 1 View  Result Date: 05/11/2023 CLINICAL DATA:  Abdominal distension and Ogilvie syndrome. EXAM: ABDOMEN - 1 VIEW COMPARISON:  05/10/2023 FINDINGS: Stable gaseous distension of the colon consistent with chronic Ogilvie syndrome and primarily affecting the transverse, descending and sigmoid colon. Maximum diameter of the colon is difficult to measure by x-ray but appears to be stable since  5/30. No gross evidence of free intraperitoneal air. 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 No gross evidence of free intraperitoneal air. Electronically Signed   By: Irish Lack M.D.   On: 05/11/2023 07:57   ECHOCARDIOGRAM COMPLETE  Result Date: 05/10/2023    ECHOCARDIOGRAM REPORT   Patient Name:   Terry Garfias Sr. Date of Exam: 05/10/2023 Medical Rec #:  [id number]        Height:       72.0 in Accession #:    [id number]        Weight:       246.9 lb Date of Birth:  12-Sep-1956        BSA:          2.330 m Patient Age:    67 years         BP:           152/82 mmHg Patient Gender: M                HR:           94 bpm. Exam Location:  Inpatient Procedure: 2D Echo, Cardiac Doppler and Color Doppler Indications:    Sinus tachycardia [[id number]]  History:        Patient has prior history of Echocardiogram examinations, most                 recent 05/12/2020. Stroke, Arrythmias:Tachycardia; Risk                 Factors:Hypertension, Diabetes, Non-Smoker and Dyslipidemia.  Sonographer:    Aron Baba Referring Phys: [id number] [facility] M PATEL  Sonographer Comments: No subcostal window. IMPRESSIONS  1. Left ventricular ejection fraction, by estimation, is 65 to 70%. The left ventricle has normal function. The left ventricle has no regional wall motion abnormalities. There is mild left ventricular hypertrophy. Left ventricular diastolic parameters are consistent with Grade I diastolic dysfunction (impaired relaxation).  2. Right ventricular systolic function is normal. The right ventricular size is normal.  3. The mitral valve is normal in structure. No evidence of mitral valve regurgitation. No evidence of mitral stenosis.  4. The aortic valve was not well visualized. Aortic valve regurgitation is not visualized. No aortic stenosis is present. FINDINGS  Left Ventricle: Left ventricular ejection fraction, by estimation, is 65 to 70%. The left ventricle has normal function. The left ventricle has no regional wall motion abnormalities. The left ventricular internal cavity size was normal in size. There is  mild left ventricular hypertrophy. Left ventricular diastolic parameters are consistent with Grade I diastolic dysfunction (impaired relaxation). Right Ventricle: The right ventricular size is normal. No increase in right ventricular wall thickness. Right ventricular systolic function is normal. Left Atrium: Left atrial size was normal in size. Right  Atrium: Right atrial size was normal in size. Pericardium: There is no evidence of pericardial effusion. Presence of epicardial fat layer. Mitral Valve: The mitral valve is normal in structure. No evidence of mitral valve regurgitation. No evidence of mitral valve stenosis. Tricuspid Valve: The tricuspid valve is normal in structure. Tricuspid valve regurgitation is trivial. Aortic Valve: The aortic valve was not well visualized. Aortic valve regurgitation is not visualized. No aortic stenosis is present. Pulmonic Valve: The pulmonic valve was not well visualized. Pulmonic valve regurgitation is trivial. Aorta: The aortic root is normal in size and structure. IAS/Shunts: The interatrial septum was not well visualized.  LEFT VENTRICLE PLAX 2D LVIDd:  4.20 cm   Diastology LVIDs:         2.50 cm   LV e' medial:    4.30 cm/s LV PW:         1.10 cm   LV E/e' medial:  15.1 LV IVS:        0.90 cm   LV e' lateral:   8.43 cm/s LVOT diam:     2.10 cm   LV E/e' lateral: 7.7 LV SV:         67 LV SV Index:   29 LVOT Area:     3.46 cm  RIGHT VENTRICLE RV S prime:     13.90 cm/s TAPSE (M-mode): 2.4 cm LEFT ATRIUM           Index        RIGHT ATRIUM           Index LA diam:      2.90 cm 1.24 cm/m   RA Area:     18.30 cm LA Vol (A2C): 24.1 ml 10.34 ml/m  RA Volume:   51.00 ml  21.89 ml/m LA Vol (A4C): 32.8 ml 14.08 ml/m  AORTIC VALVE LVOT Vmax:   92.80 cm/s LVOT Vmean:  62.400 cm/s LVOT VTI:    0.192 m  AORTA Ao Root diam: 3.50 cm Ao Asc diam:  3.80 cm MITRAL VALVE               TRICUSPID VALVE MV Area (PHT): 2.84 cm    TR Peak grad:   5.3 mmHg MV Decel Time: 267 msec    TR Vmax:        115.00 cm/s MR Peak grad: 4.4 mmHg MR Vmax:      105.35 cm/s  SHUNTS MV E velocity: 65.00 cm/s  Systemic VTI:  0.19 m MV A velocity: 96.90 cm/s  Systemic Diam: 2.10 cm MV E/A ratio:  0.67 Epifanio Lesches MD Electronically signed by Epifanio Lesches MD Signature Date/Time: 05/10/2023/4:24:39 PM    Final        Assessment /  Plan:    #4 66 year old African-American male admitted 5 days ago, second admission in the past month for recurrent severe colonic distention/Ogilvie's. Patient underwent sigmoidoscopy with decompression x 2 during last admission, was also managed with Relistor, MiraLAX and Dulcolax.  This admission thus far has not had repeat decompression as this had only been temporizing.  He has received Relistor x 3, will give another dose today, continue MiraLAX twice daily.  Continue Dulcolax 2 daily  Sigmoid measuring over 17 cm on imaging today-May be forced to do another flexible sigmoidoscopy with decompression tomorrow, will discuss with MD.  I did discuss this with patient and he is agreeable if needed  .  Will give him some tapwater enemas this afternoon to try to aid with decompression  Was seen by surgery yesterday, consult appreciated.  Surgery is not in favor of diverting colostomy, however this certainly has been used in the past with good success for these patients.  Understanding that it does not fix the underlying motility but certainly offer a good palliation to vent the bowel.  Will keep him n.p.o. after midnight for possible flex with decompression tomorrow We will plan to keep him on Relistor daily while here Will also start Linzess 290 mcg daily, Motegrity would be better but not on formulary and unlikely his insurance will cover       Principal Problem:   Ogilvie's syndrome Active Problems:   Essential hypertension  Hypokalemia   Diabetes mellitus (HCC)   Hypothyroidism   Stage 3b chronic kidney disease (CKD) (HCC)   Pressure injury of skin   Colon distention     LOS: 4 days   Amy Esterwood PA-C 05/12/2023, 12:21 PM    Attending physician's note   I have taken a history, reviewed the chart and examined the patient. I performed a substantive portion of this encounter, including complete performance of at least one of the key components, in conjunction with the APP. I  agree with the APP's note, impression and recommendations.    Abdomen is firm with severe distention secondary to colonic ileus/Ogilvie syndrome Turn in bed from side-to-side every 3-4 hours Rectal tube Use Relistor daily Will plan for flexible sigmoidoscopy tomorrow for decompression if he continues to have significant distention  The patient was provided an opportunity to ask questions and all were answered. The patient agreed with the plan and demonstrated an understanding of the instructions.   Iona Beard , MD [phone]

## 2023-05-13 ENCOUNTER — Encounter (HOSPITAL_COMMUNITY)
Admission: EM | Disposition: A | Discharge status: Home / Self Care | Payer: Self-pay | Source: Home / Self Care | Attending: Internal Medicine

## 2023-05-13 ENCOUNTER — Inpatient Hospital Stay (HOSPITAL_COMMUNITY): Payer: Medicare HMO | Admitting: Certified Registered Nurse Anesthetist

## 2023-05-13 ENCOUNTER — Encounter (HOSPITAL_COMMUNITY): Payer: Self-pay | Admitting: Internal Medicine

## 2023-05-13 DIAGNOSIS — E876 Hypokalemia: Secondary | ICD-10-CM | POA: Diagnosis not present

## 2023-05-13 DIAGNOSIS — I10 Essential (primary) hypertension: Secondary | ICD-10-CM

## 2023-05-13 DIAGNOSIS — E1122 Type 2 diabetes mellitus with diabetic chronic kidney disease: Secondary | ICD-10-CM | POA: Diagnosis not present

## 2023-05-13 DIAGNOSIS — K5939 Other megacolon: Secondary | ICD-10-CM | POA: Diagnosis not present

## 2023-05-13 DIAGNOSIS — K562 Volvulus: Secondary | ICD-10-CM

## 2023-05-13 DIAGNOSIS — N1832 Chronic kidney disease, stage 3b: Secondary | ICD-10-CM

## 2023-05-13 DIAGNOSIS — K567 Ileus, unspecified: Secondary | ICD-10-CM

## 2023-05-13 DIAGNOSIS — E039 Hypothyroidism, unspecified: Secondary | ICD-10-CM

## 2023-05-13 DIAGNOSIS — K6389 Other specified diseases of intestine: Secondary | ICD-10-CM

## 2023-05-13 DIAGNOSIS — L8992 Pressure ulcer of unspecified site, stage 2: Secondary | ICD-10-CM

## 2023-05-13 DIAGNOSIS — E119 Type 2 diabetes mellitus without complications: Secondary | ICD-10-CM

## 2023-05-13 DIAGNOSIS — K5981 Ogilvie syndrome: Secondary | ICD-10-CM | POA: Diagnosis not present

## 2023-05-13 HISTORY — PX: BOWEL DECOMPRESSION: SHX5532

## 2023-05-13 HISTORY — PX: FLEXIBLE SIGMOIDOSCOPY: SHX5431

## 2023-05-13 LAB — COMPREHENSIVE METABOLIC PANEL
ALT: 35 U/L (ref 0–44)
AST: 29 U/L (ref 15–41)
Albumin: 2.8 g/dL — ABNORMAL LOW (ref 3.5–5.0)
Alkaline Phosphatase: 152 U/L — ABNORMAL HIGH (ref 38–126)
Anion gap: 13 (ref 5–15)
BUN: 21 mg/dL (ref 8–23)
CO2: 12 mmol/L — ABNORMAL LOW (ref 22–32)
Calcium: 8.7 mg/dL — ABNORMAL LOW (ref 8.9–10.3)
Chloride: 107 mmol/L (ref 98–111)
Creatinine, Ser: 1.98 mg/dL — ABNORMAL HIGH (ref 0.61–1.24)
GFR, Estimated: 36 mL/min — ABNORMAL LOW (ref >60)
Glucose, Bld: 97 mg/dL (ref 70–99)
Potassium: 3.8 mmol/L (ref 3.5–5.1)
Sodium: 132 mmol/L — ABNORMAL LOW (ref 135–145)
Total Bilirubin: 0.6 mg/dL (ref 0.3–1.2)
Total Protein: 6 g/dL — ABNORMAL LOW (ref 6.5–8.1)

## 2023-05-13 LAB — GLUCOSE, CAPILLARY
Glucose-Capillary: 127 mg/dL — ABNORMAL HIGH (ref 70–99)
Glucose-Capillary: 91 mg/dL (ref 70–99)
Glucose-Capillary: 105 mg/dL — ABNORMAL HIGH (ref 70–99)
Glucose-Capillary: 102 mg/dL — ABNORMAL HIGH (ref 70–99)
Glucose-Capillary: 129 mg/dL — ABNORMAL HIGH (ref 70–99)

## 2023-05-13 LAB — CBC WITH DIFFERENTIAL/PLATELET
Abs Immature Granulocytes: 0.08 10*3/uL — ABNORMAL HIGH (ref 0.00–0.07)
Basophils Absolute: 0 10*3/uL (ref 0.0–0.1)
Basophils Relative: 0 %
Eosinophils Absolute: 0.3 10*3/uL (ref 0.0–0.5)
Eosinophils Relative: 3 %
HCT: 33.2 % — ABNORMAL LOW (ref 39.0–52.0)
Hemoglobin: 10.5 g/dL — ABNORMAL LOW (ref 13.0–17.0)
Immature Granulocytes: 1 %
Lymphocytes Relative: 28 %
Lymphs Abs: 2.9 10*3/uL (ref 0.7–4.0)
MCH: 30.4 pg (ref 26.0–34.0)
MCHC: 31.6 g/dL (ref 30.0–36.0)
MCV: 96.2 fL (ref 80.0–100.0)
Monocytes Absolute: 0.8 10*3/uL (ref 0.1–1.0)
Monocytes Relative: 8 %
Neutro Abs: 6.1 10*3/uL (ref 1.7–7.7)
Neutrophils Relative %: 60 %
Platelets: 263 10*3/uL (ref 150–400)
RBC: 3.45 MIL/uL — ABNORMAL LOW (ref 4.22–5.81)
RDW: 15 % (ref 11.5–15.5)
WBC: 10.1 10*3/uL (ref 4.0–10.5)
nRBC: 0 % (ref 0.0–0.2)

## 2023-05-13 LAB — MAGNESIUM: Magnesium: 2 mg/dL (ref 1.7–2.4)

## 2023-05-13 SURGERY — SIGMOIDOSCOPY, FLEXIBLE
Anesthesia: General

## 2023-05-13 MED ORDER — PHENYLEPHRINE 80 MCG/ML (10ML) SYRINGE FOR IV PUSH (FOR BLOOD PRESSURE SUPPORT)
PREFILLED_SYRINGE | INTRAVENOUS | Status: DC | PRN
  Administered 2023-05-13 (×5): 160 ug via INTRAVENOUS

## 2023-05-13 MED ORDER — LIDOCAINE 2% (20 MG/ML) 5 ML SYRINGE
INTRAMUSCULAR | Status: DC | PRN
  Administered 2023-05-13: 40 mg via INTRAVENOUS

## 2023-05-13 MED ORDER — ROCURONIUM BROMIDE 10 MG/ML (PF) SYRINGE
PREFILLED_SYRINGE | INTRAVENOUS | Status: DC | PRN
  Administered 2023-05-13: 50 mg via INTRAVENOUS

## 2023-05-13 MED ORDER — PROPOFOL 10 MG/ML IV BOLUS
INTRAVENOUS | Status: DC | PRN
  Administered 2023-05-13: 100 mg via INTRAVENOUS

## 2023-05-13 MED ORDER — SODIUM CHLORIDE 0.9 % IV SOLN
INTRAVENOUS | Status: AC | PRN
  Administered 2023-05-13: 500 mL via INTRAVENOUS

## 2023-05-13 MED ORDER — ONDANSETRON HCL 4 MG/2ML IJ SOLN
INTRAMUSCULAR | Status: DC | PRN
  Administered 2023-05-13: 4 mg via INTRAVENOUS

## 2023-05-13 MED ORDER — SUGAMMADEX SODIUM 200 MG/2ML IV SOLN
INTRAVENOUS | Status: DC | PRN
  Administered 2023-05-13 (×2): 200 mg via INTRAVENOUS

## 2023-05-13 NOTE — Anesthesia Procedure Notes (Signed)
Procedure Name: Intubation Date/Time: 05/13/2023 12:54 PM  Performed by: Waynard Edwards, CRNAPre-anesthesia Checklist: Patient identified, Emergency Drugs available, Suction available and Patient being monitored Patient Re-evaluated:Patient Re-evaluated prior to induction Oxygen Delivery Method: Circle system utilized Preoxygenation: Pre-oxygenation with 100% oxygen Induction Type: IV induction Ventilation: Mask ventilation without difficulty Laryngoscope Size: Miller and 2 Grade View: Grade I Tube type: Oral Tube size: 7.5 mm Number of attempts: 1 Airway Equipment and Method: Stylet Placement Confirmation: ETT inserted through vocal cords under direct vision, positive ETCO2 and breath sounds checked- equal and bilateral Secured at: 23 cm Tube secured with: Tape Dental Injury: Teeth and Oropharynx as per pre-operative assessment

## 2023-05-13 NOTE — Anesthesia Preprocedure Evaluation (Signed)
Anesthesia Evaluation  Patient identified by MRN, date of birth, ID band Patient awake    Reviewed: Allergy & Precautions, H&P , NPO status , Patient's Chart, lab work & pertinent test results  Airway Mallampati: II   Neck ROM: full    Dental   Pulmonary neg pulmonary ROS   breath sounds clear to auscultation       Cardiovascular hypertension,  Rhythm:regular Rate:Normal     Neuro/Psych Paraplegic CVA    GI/Hepatic   Endo/Other  diabetes, Type 2Hypothyroidism    Renal/GU Renal InsufficiencyRenal diseaseCr 1.98     Musculoskeletal   Abdominal   Peds  Hematology   Anesthesia Other Findings   Reproductive/Obstetrics                             Anesthesia Physical Anesthesia Plan  ASA: 3  Anesthesia Plan: General   Post-op Pain Management:    Induction: Intravenous  PONV Risk Score and Plan: 2 and Ondansetron, Dexamethasone, Midazolam and Treatment may vary due to age or medical condition  Airway Management Planned: Oral ETT  Additional Equipment:   Intra-op Plan:   Post-operative Plan: Extubation in OR  Informed Consent: I have reviewed the patients History and Physical, chart, labs and discussed the procedure including the risks, benefits and alternatives for the proposed anesthesia with the patient or authorized representative who has indicated his/her understanding and acceptance.     Dental advisory given  Plan Discussed with: CRNA, Anesthesiologist and Surgeon  Anesthesia Plan Comments:        Anesthesia Quick Evaluation

## 2023-05-13 NOTE — Op Note (Signed)
[facility] Patient Name: Terry Barton Procedure Date : 05/13/2023 MRN: [id number] Attending MD: Napoleon Form , MD, [id number] Date of Birth: 1956/04/07 CSN: [id number] Age: 67 Admit Type: Inpatient Procedure:                Flexible Sigmoidoscopy Indications:              Abnormal CT of the GI tract, Suspected volvulus,                            Generalized abdominal pain Providers:                Napoleon Form, MD, Marja Kays,                            Technician, Adolph Pollack, RN Referring MD:              Medicines:                Monitored Anesthesia Care Complications:            No immediate complications. Estimated Blood Loss:     Estimated blood loss: none. Procedure:                Pre-Anesthesia Assessment:                           - Prior to the procedure, a History and Physical                            was performed, and patient medications and                            allergies were reviewed. The patient's tolerance of                            previous anesthesia was also reviewed. The risks                            and benefits of the procedure and the sedation                            options and risks were discussed with the patient.                            All questions were answered, and informed consent                            was obtained. Prior Anticoagulants: The patient has                            taken no anticoagulant or antiplatelet agents. ASA                            Grade Assessment: IV - A patient with severe  systemic disease that is a constant threat to life.                            After reviewing the risks and benefits, the patient                            was deemed in satisfactory condition to undergo the                            procedure.                           After obtaining informed consent, the scope was                            passed under  direct vision. The [id number]-[id number]                            ([id number] Olympus peds colonoscope was introduced                            through the anus and advanced to the the descending                            colon. The flexible sigmoidoscopy was somewhat                            difficult due to poor bowel prep with stool                            present. The quality of the bowel preparation was                            poor. Scope In: 1:01:24 PM Scope Out: 1:23:46 PM Total Procedure Duration: 0 hours 22 minutes 22 seconds  Findings:      The perianal and digital rectal examinations were normal.      The lumen of the sigmoid colon was grossly dilated. Decompression of the       sigmoid colon was attempted and was successful, with complete       decompression achieved.      Limited visualization of the colonic mucosa due to inadequate bowel       prep, no obstructing lesion Impression:               - Preparation of the colon was poor.                           - Dilated in the sigmoid colon.                           - No specimens collected. Recommendation:           - Return patient to hospital ward for ongoing care.                           -  Continue diet as tolerated                           - Turn in bed every 2 hours                           - Replete electrolytes daily                           - Rectal tube                           - Bowel regimen to prevent constipation                           - Please consult surgery for evaluation for                            possible venting colostomy to prevent recurrent                            colonic gasseous distension                           - GI is available if needed, please call with any                            questions Procedure Code(s):        --- Professional ---                           [phone], Sigmoidoscopy, flexible; with decompression                            (for pathologic distention) (eg,  volvulus,                            megacolon), including placement of decompression                            tube, when performed Diagnosis Code(s):        --- Professional ---                           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 All rights reserved. The codes documented in this report are preliminary and upon coder review may  be revised to meet current compliance requirements. Napoleon Form, MD 05/13/2023 1:47:16 PM This  report has been signed electronically. Number of Addenda: 0

## 2023-05-13 NOTE — Plan of Care (Signed)

## 2023-05-13 NOTE — Progress Notes (Signed)
PROGRESS NOTE  Terry Declerck Sr. ONG:[id number] DOB: 09-Mar-1956   PCP: Loura Back, NP  Patient is from: Home.  Lives with son.  This will check baseline.  DOA: 05/07/2023 LOS: 5  Chief complaints Chief Complaint  Patient presents with   Bloated     Brief Narrative / Interim history: 67 year old M with PMH of paraplegia with wheelchair dependence, CVA, HTN, HLD, CKD-3B, PVD and obesity presenting with abdominal distention due to recurrent Oglive syndrome.  Flex sigmoidoscopy on 6/5 with poor preparation and dilated sigmoid colon.  GI recommended turning in bed every 2 hours, replating electrolytes daily, rectal tube, bowel regimen and surgery consultation for possible venting colostomy.     Subjective: Seen and examined earlier this morning before he went for [facility] sigmoidoscopy.  No complaints.  Reports having large bowel movements last night.  Denies nausea or vomiting.  Objective: Vitals:   05/13/23 1150 05/13/23 1337 05/13/23 1350 05/13/23 1400  BP: (!) 166/77 (!) 151/67 (!) 159/65 (!) 149/70  Pulse: 70 75 67 67  Resp: 20 10 17 17   Temp: 97.7 F (36.5 C) 97.7 F (36.5 C)    TempSrc: Temporal Temporal    SpO2: 100% 100% 98% 99%  Weight: 109.9 kg     Height:        Examination:  GENERAL: No apparent distress.  Nontoxic. HEENT: MMM.  Vision and hearing grossly intact.  NECK: Supple.  No apparent JVD.  RESP:  No IWOB.  Fair aeration bilaterally. CVS:  RRR. Heart sounds normal.  ABD/GI/GU: BS+. Abd distended and firm.  No tenderness. MSK/EXT:  Moves extremities.  BLE weakness.  No apparent deformity. No edema.  SKIN: no apparent skin lesion or wound NEURO: Awake, alert and oriented appropriately.  No apparent focal neuro deficit. PSYCH: Calm. Normal affect.   Procedures:  6/5-Fleck sigmoidoscopy as above  Microbiology summarized: None  Assessment and plan: Principal Problem:   Ogilvie's syndrome Active Problems:   Essential hypertension   Hypokalemia    Diabetes mellitus (HCC)   Hypothyroidism   Stage 3b chronic kidney disease (CKD) (HCC)   Pressure injury of skin   Colon distention   Volvulus of sigmoid colon (HCC)  Recurrent Ogilvie's syndrome: Patient was not taking Relistor on discharge.  Recently hospitalized for the same and underwent decompressive sigmoidoscopy.  Fleck sigmoidoscopy with dilated sigmoid colon -Appreciate GI recs:  -Turn in bed every 2 hours,  -Replete electrolytes daily,  -rectal tube  -bowel regimen-IV Relistor, simethicone, glycerin suppository, senna and MiraLAX -Surgery consultation for possible venting colostomy (done by GI).  -Agree with discontinuing amlodipine   Essential hypertension: BP slightly elevated. -Continue hydralazine.  Amlodipine discontinued due to theoretical risk of smooth muscle relaxation   Hyperlipidemia -Holding statin for now.   Hypothyroidism -Continue Synthroid.   CKD 3B: Baseline Cr 1.7-1.9.  Stable.   Hypokalemia/hypomagnesemia. -Monitor replenish as appropriate   Rectal polyp Outpatient follow-up with GI.   Uncontrolled NIDDM-2 with CKD-3B and hyperglycemia: A1c 7.1%. Recent Labs  Lab 05/12/23 1150 05/12/23 1732 05/12/23 2151 05/13/23 0831 05/13/23 1153  GLUCAP 121* 108* 127* 91 105*  -Continue SSI-sensitive -Further adjustment as appropriate   Normocytic anemia: Stable Recent Labs    04/01/23 0048 04/02/23 0709 04/09/23 0450 05/07/23 2217 05/07/23 2325 05/08/23 0216 05/09/23 0043 05/10/23 0052 05/11/23 1246 05/13/23 0110  HGB 11.6* 10.6* 10.0* 10.8* 11.2* 10.2* 11.0* 11.7* 11.0* 10.5*  -Monitor intermittently  Debility/ambulatory dysfunction: Uses wheelchair at baseline. -PT/OT eval   Obesity Body mass index is 32.86 kg/m.  Pressure skin injury: POA Pressure Injury 05/08/23 Buttocks Right;Left Stage 2 -  Partial thickness loss of dermis presenting as a shallow open injury with a red, pink wound bed without slough. this is healed pink scar  tissue and patchy areas of moisture associated skin damage, N (Active)  05/08/23 0352  Location: Buttocks  Location Orientation: Right;Left  Staging: Stage 2 -  Partial thickness loss of dermis presenting as a shallow open injury with a red, pink wound bed without slough.  Wound Description (Comments): this is healed pink scar tissue and patchy areas of moisture associated skin damage, NOT a pressure injury  Present on Admission: Yes  Dressing Type Foam - Lift dressing to assess site every shift 05/12/23 2045   DVT prophylaxis:  SCDs Start: 05/08/23 0055  Code Status: Full code Family Communication: None at bedside Level of care: Med-Surg Status is: Inpatient Remains inpatient appropriate because: Recurrent Ogilvie syndrome   Final disposition: TBD Consultants:  GI General surgery  35 minutes with more than 50% spent in reviewing records, counseling patient/family and coordinating care.   Sch Meds:  Scheduled Meds:  allopurinol  100 mg Oral BID   Glycerin (Adult)  1 suppository Rectal QHS   hydrALAZINE  25 mg Oral Q8H   insulin aspart  0-5 Units Subcutaneous QHS   insulin aspart  0-9 Units Subcutaneous TID WC   levothyroxine  50 mcg Oral Q0600   linaclotide  290 mcg Oral QAC breakfast   magnesium oxide  400 mg Oral BID   polyethylene glycol  17 g Oral BID   potassium chloride  30 mEq Oral BID   senna  2 tablet Oral QHS   simethicone  80 mg Oral TID AC & HS   Continuous Infusions: PRN Meds:.acetaminophen **OR** acetaminophen, fentaNYL (SUBLIMAZE) injection, hydrALAZINE, naLOXone (NARCAN)  injection, ondansetron (ZOFRAN) IV  Antimicrobials: Anti-infectives (From admission, onward)    None        I have personally reviewed the following labs and images: CBC: Recent Labs  Lab 05/07/23 2217 05/07/23 2325 05/08/23 0216 05/09/23 0043 05/10/23 0052 05/11/23 1246 05/13/23 0110  WBC 15.7*  --  13.3* 12.0* 10.9* 10.9* 10.1  NEUTROABS 11.3*  --  9.2*  --   --    --  6.1  HGB 10.8*   < > 10.2* 11.0* 11.7* 11.0* 10.5*  HCT 33.7*   < > 32.7* 35.3* 37.0* 34.7* 33.2*  MCV 95.7  --  94.5 95.1 97.1 94.3 96.2  PLT 282  --  256 240 258 288 263   < > = values in this interval not displayed.   BMP &GFR Recent Labs  Lab 05/10/23 1632 05/11/23 1023 05/11/23 1731 05/12/23 0804 05/13/23 0110  NA 137 137 136 136 132*  K 3.4* 3.2* 3.1* 3.6 3.8  CL 106 109 109 109 107  CO2 17* 18* 18* 16* 12*  GLUCOSE 139* 105* 145* 102* 97  BUN 18 17 19 21 21   CREATININE 1.82* 1.87* 1.91* 2.04* 1.98*  CALCIUM 9.0 8.9 8.9 9.0 8.7*  MG 2.0 1.9 2.1 1.9 2.0   Estimated Creatinine Clearance: 45.1 mL/min (A) (by C-G formula based on SCr of 1.98 mg/dL (H)). Liver & Pancreas: Recent Labs  Lab 05/07/23 2319 05/08/23 0216 05/10/23 0052 05/11/23 1023 05/13/23 0110  AST 28 22 19 26 29   ALT 63* 59* 39 33 35  ALKPHOS 177* 186* 155* 149* 152*  BILITOT 0.7 0.8 0.6 0.6 0.6  PROT 6.7 6.7 7.2 6.7 6.0*  ALBUMIN  2.9* 2.8* 3.0* 2.9* 2.8*   No results for input(s): "LIPASE", "AMYLASE" in the last 168 hours. No results for input(s): "AMMONIA" in the last 168 hours. Diabetic: No results for input(s): "HGBA1C" in the last 72 hours. Recent Labs  Lab 05/12/23 1150 05/12/23 1732 05/12/23 2151 05/13/23 0831 05/13/23 1153  GLUCAP 121* 108* 127* 91 105*   Cardiac Enzymes: No results for input(s): "CKTOTAL", "CKMB", "CKMBINDEX", "TROPONINI" in the last 168 hours. No results for input(s): "PROBNP" in the last 8760 hours. Coagulation Profile: No results for input(s): "INR", "PROTIME" in the last 168 hours. Thyroid Function Tests: No results for input(s): "TSH", "T4TOTAL", "FREET4", "T3FREE", "THYROIDAB" in the last 72 hours. Lipid Profile: No results for input(s): "CHOL", "HDL", "LDLCALC", "TRIG", "CHOLHDL", "LDLDIRECT" in the last 72 hours. Anemia Panel: No results for input(s): "VITAMINB12", "FOLATE", "FERRITIN", "TIBC", "IRON", "RETICCTPCT" in the last 72 hours. Urine  analysis:    Component Value Date/Time   COLORURINE YELLOW 04/01/2023 0527   APPEARANCEUR HAZY (A) 04/01/2023 0527   LABSPEC 1.012 04/01/2023 0527   PHURINE 5.0 04/01/2023 0527   GLUCOSEU NEGATIVE 04/01/2023 0527   HGBUR NEGATIVE 04/01/2023 0527   BILIRUBINUR NEGATIVE 04/01/2023 0527   KETONESUR NEGATIVE 04/01/2023 0527   PROTEINUR 100 (A) 04/01/2023 0527   NITRITE POSITIVE (A) 04/01/2023 0527   LEUKOCYTESUR MODERATE (A) 04/01/2023 0527   Sepsis Labs: Invalid input(s): "PROCALCITONIN", "LACTICIDVEN"  Microbiology: No results found for this or any previous visit (from the past 240 hour(s)).  Radiology Studies: No results found.    Paislea Hatton T. Sereniti Wan [facility]  If 7PM-7AM, please contact night-coverage [email or web address] 05/13/2023, 3:34 PM

## 2023-05-13 NOTE — Transfer of Care (Signed)
Immediate Anesthesia Transfer of Care Note  Patient: Terry Revere Sr.  Procedure(s) Performed: FLEXIBLE SIGMOIDOSCOPY BOWEL DECOMPRESSION  Patient Location: Endoscopy Unit  Anesthesia Type:General  Level of Consciousness: drowsy and patient cooperative  Airway & Oxygen Therapy: Patient Spontanous Breathing and Patient connected to nasal cannula oxygen  Post-op Assessment: Report given to RN and Post -op Vital signs reviewed and stable  Post vital signs: Reviewed and stable  Last Vitals:  Vitals Value Taken Time  BP 151/67 05/13/23 1336  Temp    Pulse 70 05/13/23 1339  Resp 14 05/13/23 1339  SpO2 100 % 05/13/23 1339  Vitals shown include unvalidated device data.  Last Pain:  Vitals:   05/13/23 1150  TempSrc: Temporal  PainSc: 0-No pain      Patients Stated Pain Goal: 0 (05/13/23 0839)  Complications: No notable events documented.

## 2023-05-13 NOTE — Interval H&P Note (Signed)
History and Physical Interval Note:  05/13/2023 12:44 PM  Terry Barton Sr.  has presented today for surgery, with the diagnosis of colonic ileus.  The various methods of treatment have been discussed with the patient and family. After consideration of risks, benefits and other options for treatment, the patient has consented to  Procedure(s) with comments: FLEXIBLE SIGMOIDOSCOPY (N/A) - with placement of decompreesion tube as a surgical intervention.  The patient's history has been reviewed, patient examined, no change in status, stable for surgery.  I have reviewed the patient's chart and labs.  Questions were answered to the patient's satisfaction.     [name]

## 2023-05-13 NOTE — TOC Progression Note (Signed)
Transition of Care (TOC) - Progression Note    Patient Details  Name: Terry Yellowhair Sr. MRN: [id number] Date of Birth: [birth year]  Transition of Care [facility]) CM/SW Contact  Tom-Johnson, Hershal Coria, RN Phone Number: 05/13/2023, 4:31 PM  Clinical Narrative:     Patient underwent Flexible Sigmoidoscopy for Colonic Ileus today by GI. No distress noted. Patient not Medically ready for discharge at this time.  CM will continue to follow as patient progresses with care towards discharge.          Expected Discharge Plan: Home/Self Care Barriers to Discharge: Continued Medical Work up  Expected Discharge Plan and Services   Discharge Planning Services: CM Consult                     DME Arranged: N/A         HH Arranged: NA           Social Determinants of Health (SDOH) Interventions SDOH Screenings   Tobacco Use: Low Risk  (05/13/2023)    Readmission Risk Interventions    05/08/2023    2:42 PM 04/04/2023    3:30 PM  Readmission Risk Prevention Plan  Transportation Screening Complete Complete  PCP or Specialist Appt within 5-7 Days Complete Complete  Home Care Screening Complete Complete  Medication Review (RN CM) Referral to Pharmacy Referral to Pharmacy

## 2023-05-14 ENCOUNTER — Inpatient Hospital Stay (HOSPITAL_COMMUNITY): Payer: Medicare HMO

## 2023-05-14 DIAGNOSIS — I10 Essential (primary) hypertension: Secondary | ICD-10-CM | POA: Diagnosis not present

## 2023-05-14 DIAGNOSIS — E876 Hypokalemia: Secondary | ICD-10-CM | POA: Diagnosis not present

## 2023-05-14 DIAGNOSIS — E1122 Type 2 diabetes mellitus with diabetic chronic kidney disease: Secondary | ICD-10-CM | POA: Diagnosis not present

## 2023-05-14 DIAGNOSIS — K5981 Ogilvie syndrome: Secondary | ICD-10-CM | POA: Diagnosis not present

## 2023-05-14 LAB — RENAL FUNCTION PANEL
Albumin: 2.9 g/dL — ABNORMAL LOW (ref 3.5–5.0)
Anion gap: 9 (ref 5–15)
BUN: 23 mg/dL (ref 8–23)
CO2: 15 mmol/L — ABNORMAL LOW (ref 22–32)
Calcium: 8.8 mg/dL — ABNORMAL LOW (ref 8.9–10.3)
Chloride: 112 mmol/L — ABNORMAL HIGH (ref 98–111)
Creatinine, Ser: 2.16 mg/dL — ABNORMAL HIGH (ref 0.61–1.24)
GFR, Estimated: 32 mL/min — ABNORMAL LOW (ref >60)
Glucose, Bld: 115 mg/dL — ABNORMAL HIGH (ref 70–99)
Phosphorus: 4.7 mg/dL — ABNORMAL HIGH (ref 2.5–4.6)
Potassium: 3.3 mmol/L — ABNORMAL LOW (ref 3.5–5.1)
Sodium: 136 mmol/L (ref 135–145)

## 2023-05-14 LAB — CBC
HCT: 35.2 % — ABNORMAL LOW (ref 39.0–52.0)
Hemoglobin: 10.8 g/dL — ABNORMAL LOW (ref 13.0–17.0)
MCH: 30.7 pg (ref 26.0–34.0)
MCHC: 30.7 g/dL (ref 30.0–36.0)
MCV: 100 fL (ref 80.0–100.0)
Platelets: 263 10*3/uL (ref 150–400)
RBC: 3.52 MIL/uL — ABNORMAL LOW (ref 4.22–5.81)
RDW: 14.8 % (ref 11.5–15.5)
WBC: 9.9 10*3/uL (ref 4.0–10.5)
nRBC: 0 % (ref 0.0–0.2)

## 2023-05-14 LAB — GLUCOSE, CAPILLARY
Glucose-Capillary: 112 mg/dL — ABNORMAL HIGH (ref 70–99)
Glucose-Capillary: 114 mg/dL — ABNORMAL HIGH (ref 70–99)
Glucose-Capillary: 117 mg/dL — ABNORMAL HIGH (ref 70–99)
Glucose-Capillary: 134 mg/dL — ABNORMAL HIGH (ref 70–99)
Glucose-Capillary: 154 mg/dL — ABNORMAL HIGH (ref 70–99)
Glucose-Capillary: 89 mg/dL (ref 70–99)

## 2023-05-14 LAB — MAGNESIUM: Magnesium: 1.9 mg/dL (ref 1.7–2.4)

## 2023-05-14 MED ORDER — SODIUM CHLORIDE 0.45 % IV SOLN
INTRAVENOUS | Status: DC
  Filled 2023-05-14 (×3): qty 75

## 2023-05-14 MED ORDER — POTASSIUM CHLORIDE CRYS ER 20 MEQ PO TBCR
40.0000 meq | EXTENDED_RELEASE_TABLET | Freq: Once | ORAL | Status: AC
  Administered 2023-05-14: 40 meq via ORAL
  Filled 2023-05-14: qty 2

## 2023-05-14 NOTE — Progress Notes (Signed)
PROGRESS NOTE  Terry Barton. WUJ:[id number] DOB: May 04, 1956   PCP: Loura Back, NP  Patient is from: Home.  Lives with son.  This will check baseline.  DOA: 05/07/2023 LOS: 6  Chief complaints Chief Complaint  Patient presents with   Bloated     Brief Narrative / Interim history: 67 year old M with PMH of paraplegia with wheelchair dependence, CVA, HTN, HLD, CKD-3B, PVD and obesity presenting with abdominal distention due to recurrent Oglive syndrome.  Flex sigmoidoscopy on 6/5 with poor preparation and dilated sigmoid colon.  GI recommended turning in bed every 2 hours, replating electrolytes daily, rectal tube, bowel regimen and surgery consultation for possible venting colostomy.  Per discussion between GI, Dr. Lavon Paganini, and general surgery Dr. Dossie Der, colostomy won't solve chronic motility issue.  Patient may need total colectomy and end ileostomy but surgery recommended outpatient follow-up with colorectal surgeon before this.  Subjective: Seen and examined earlier this morning.  No major events overnight of this morning.  No complaints but not a great historian.  He reports having bowel movements last night.  Denies pain.  Objective: Vitals:   05/13/23 1545 05/13/23 2051 05/14/23 0440 05/14/23 0859  BP: (!) 154/78 (!) 166/83 (!) 162/69 131/60  Pulse: 67 72 80 73  Resp: 16  17 17   Temp: (!) 97.5 F (36.4 C) 98.1 F (36.7 C) 98.5 F (36.9 C) 98 F (36.7 C)  TempSrc: Oral  Oral Oral  SpO2: 98% 100% 100% 100%  Weight:      Height:        Examination:  GENERAL: No apparent distress.  Nontoxic. HEENT: MMM.  Vision and hearing grossly intact.  NECK: Supple.  No apparent JVD.  RESP:  No IWOB.  Fair aeration bilaterally. CVS:  RRR. Heart sounds normal.  ABD/GI/GU: BS+. Abd distended but softer today. MSK/EXT:  BLE weakness.  No apparent deformity. No edema.  SKIN: no apparent skin lesion or wound NEURO: Awake, alert and oriented appropriately.  No apparent focal  neuro deficit other than BLE weakness. PSYCH: Calm. Normal affect.   Procedures:  6/5-Fleck sigmoidoscopy as above  Microbiology summarized: None  Assessment and plan: Principal Problem:   Ogilvie's syndrome Active Problems:   Essential hypertension   Hypokalemia   Diabetes mellitus (HCC)   Hypothyroidism   Stage 3b chronic kidney disease (CKD) (HCC)   Pressure injury of skin   Colon distention   Volvulus of sigmoid colon (HCC)  Recurrent Ogilvie's syndrome: Patient was not taking Relistor on discharge.  Recently hospitalized for the same and underwent decompressive sigmoidoscopy.  Fleck sigmoidoscopy with dilated sigmoid colon -Appreciate GI recs:  -Turn in bed every 2 hours,  -Replete electrolytes daily,  -rectal tube  -bowel regimen-IV Relistor, simethicone, glycerin suppository, senna and MiraLAX -Surgery consultation for possible venting colostomy (done by GI).  See recommendation by surgery above. -Agree with discontinuing amlodipine   Essential hypertension: Normotensive this morning. -Continue hydralazine.  Amlodipine discontinued due to theoretical risk of smooth muscle relaxation  CKD-3B/metabolic acidosis:  Baseline Cr 1.7-1.9.  Bicarb 15. Recent Labs    05/08/23 1140 05/09/23 0043 05/09/23 1623 05/10/23 0052 05/10/23 1632 05/11/23 1023 05/11/23 1731 05/12/23 0804 05/13/23 0110 05/14/23 0134  BUN 22 20 18 17 18 17 19 21 21 23   CREATININE 1.82* 1.70* 1.74* 1.70* 1.82* 1.87* 1.91* 2.04* 1.98* 2.16*  -Start IV sodium bicarbonate -Recheck in the morning  Uncontrolled NIDDM-2 with CKD-3B and hyperglycemia: A1c 7.1%. Recent Labs  Lab 05/13/23 1658 05/13/23 2052 05/13/23 2355 05/14/23 0457  05/14/23 0901  GLUCAP 102* 129* 154* 117* 114*  -Continue SSI-sensitive -Further adjustment as appropriate   Normocytic anemia: Stable Recent Labs    04/02/23 0709 04/09/23 0450 05/07/23 2217 05/07/23 2325 05/08/23 0216 05/09/23 0043 05/10/23 0052  05/11/23 1246 05/13/23 0110 05/14/23 0134  HGB 10.6* 10.0* 10.8* 11.2* 10.2* 11.0* 11.7* 11.0* 10.5* 10.8*  -Monitor intermittently  Debility/ambulatory dysfunction: Uses wheelchair at baseline. -PT/OT eval  Hyperlipidemia -Holding statin for now.   Hypothyroidism -Continue Synthroid.   Hypokalemia/hypomagnesemia. -Monitor replenish as appropriate   Rectal polyp -Outpatient follow-up with GI.  Obesity Body mass index is 32.86 kg/m.   Pressure skin injury: POA Pressure Injury 05/08/23 Buttocks Right;Left Stage 2 -  Partial thickness loss of dermis presenting as a shallow open injury with a red, pink wound bed without slough. this is healed pink scar tissue and patchy areas of moisture associated skin damage, N (Active)  05/08/23 0352  Location: Buttocks  Location Orientation: Right;Left  Staging: Stage 2 -  Partial thickness loss of dermis presenting as a shallow open injury with a red, pink wound bed without slough.  Wound Description (Comments): this is healed pink scar tissue and patchy areas of moisture associated skin damage, NOT a pressure injury  Present on Admission: Yes  Dressing Type Foam - Lift dressing to assess site every shift 05/13/23 2056   DVT prophylaxis:  SCDs Start: 05/08/23 0055  Code Status: Full code Family Communication: None at bedside Level of care: Med-Surg Status is: Inpatient Remains inpatient appropriate because: Recurrent Ogilvie syndrome   Final disposition: TBD Consultants:  GI General surgery  35 minutes with more than 50% spent in reviewing records, counseling patient/family and coordinating care.   Sch Meds:  Scheduled Meds:  allopurinol  100 mg Oral BID   Glycerin (Adult)  1 suppository Rectal QHS   hydrALAZINE  25 mg Oral Q8H   insulin aspart  0-5 Units Subcutaneous QHS   insulin aspart  0-9 Units Subcutaneous TID WC   levothyroxine  50 mcg Oral Q0600   linaclotide  290 mcg Oral QAC breakfast   magnesium oxide  400  mg Oral BID   polyethylene glycol  17 g Oral BID   senna  2 tablet Oral QHS   simethicone  80 mg Oral TID AC & HS   Continuous Infusions:  sodium bicarbonate 75 mEq in sodium chloride 0.45 % 1,075 mL infusion 75 mL/hr at 05/14/23 1054   PRN Meds:.acetaminophen **OR** acetaminophen, fentaNYL (SUBLIMAZE) injection, hydrALAZINE, naLOXone (NARCAN)  injection, ondansetron (ZOFRAN) IV  Antimicrobials: Anti-infectives (From admission, onward)    None        I have personally reviewed the following labs and images: CBC: Recent Labs  Lab 05/07/23 2217 05/07/23 2325 05/08/23 0216 05/09/23 0043 05/10/23 0052 05/11/23 1246 05/13/23 0110 05/14/23 0134  WBC 15.7*  --  13.3* 12.0* 10.9* 10.9* 10.1 9.9  NEUTROABS 11.3*  --  9.2*  --   --   --  6.1  --   HGB 10.8*   < > 10.2* 11.0* 11.7* 11.0* 10.5* 10.8*  HCT 33.7*   < > 32.7* 35.3* 37.0* 34.7* 33.2* 35.2*  MCV 95.7  --  94.5 95.1 97.1 94.3 96.2 100.0  PLT 282  --  256 240 258 288 263 263   < > = values in this interval not displayed.   BMP &GFR Recent Labs  Lab 05/11/23 1023 05/11/23 1731 05/12/23 0804 05/13/23 0110 05/14/23 0134  NA 137 136 136 132* 136  K 3.2* 3.1* 3.6 3.8 3.3*  CL 109 109 109 107 112*  CO2 18* 18* 16* 12* 15*  GLUCOSE 105* 145* 102* 97 115*  BUN 17 19 21 21 23   CREATININE 1.87* 1.91* 2.04* 1.98* 2.16*  CALCIUM 8.9 8.9 9.0 8.7* 8.8*  MG 1.9 2.1 1.9 2.0 1.9  PHOS  --   --   --   --  4.7*   Estimated Creatinine Clearance: 41.3 mL/min (A) (by C-G formula based on SCr of 2.16 mg/dL (H)). Liver & Pancreas: Recent Labs  Lab 05/07/23 2319 05/08/23 0216 05/10/23 0052 05/11/23 1023 05/13/23 0110 05/14/23 0134  AST 28 22 19 26 29   --   ALT 63* 59* 39 33 35  --   ALKPHOS 177* 186* 155* 149* 152*  --   BILITOT 0.7 0.8 0.6 0.6 0.6  --   PROT 6.7 6.7 7.2 6.7 6.0*  --   ALBUMIN 2.9* 2.8* 3.0* 2.9* 2.8* 2.9*   No results for input(s): "LIPASE", "AMYLASE" in the last 168 hours. No results for input(s):  "AMMONIA" in the last 168 hours. Diabetic: No results for input(s): "HGBA1C" in the last 72 hours. Recent Labs  Lab 05/13/23 1658 05/13/23 2052 05/13/23 2355 05/14/23 0457 05/14/23 0901  GLUCAP 102* 129* 154* 117* 114*   Cardiac Enzymes: No results for input(s): "CKTOTAL", "CKMB", "CKMBINDEX", "TROPONINI" in the last 168 hours. No results for input(s): "PROBNP" in the last 8760 hours. Coagulation Profile: No results for input(s): "INR", "PROTIME" in the last 168 hours. Thyroid Function Tests: No results for input(s): "TSH", "T4TOTAL", "FREET4", "T3FREE", "THYROIDAB" in the last 72 hours. Lipid Profile: No results for input(s): "CHOL", "HDL", "LDLCALC", "TRIG", "CHOLHDL", "LDLDIRECT" in the last 72 hours. Anemia Panel: No results for input(s): "VITAMINB12", "FOLATE", "FERRITIN", "TIBC", "IRON", "RETICCTPCT" in the last 72 hours. Urine analysis:    Component Value Date/Time   COLORURINE YELLOW 04/01/2023 0527   APPEARANCEUR HAZY (A) 04/01/2023 0527   LABSPEC 1.012 04/01/2023 0527   PHURINE 5.0 04/01/2023 0527   GLUCOSEU NEGATIVE 04/01/2023 0527   HGBUR NEGATIVE 04/01/2023 0527   BILIRUBINUR NEGATIVE 04/01/2023 0527   KETONESUR NEGATIVE 04/01/2023 0527   PROTEINUR 100 (A) 04/01/2023 0527   NITRITE POSITIVE (A) 04/01/2023 0527   LEUKOCYTESUR MODERATE (A) 04/01/2023 0527   Sepsis Labs: Invalid input(s): "PROCALCITONIN", "LACTICIDVEN"  Microbiology: No results found for this or any previous visit (from the past 240 hour(s)).  Radiology Studies: DG Abd 2 Views  Result Date: 05/14/2023 CLINICAL DATA:  Abdominal bloating.  Follow-up ileus. EXAM: ABDOMEN - 2 VIEW COMPARISON:  05/12/2023 and older studies. FINDINGS: There is mild overall gaseous distension of the colon and small bowel. Sigmoid colon distension is less than noted on the previous exam, with remaining bowel distension being similar. Soft tissues are poorly defined, grossly unremarkable. IMPRESSION: 1. Sigmoid colon is  less distended than on the most recent prior study. Remainder of the bowel shows persistent mild gaseous distension. Electronically Signed   By: Amie Portland M.D.   On: 05/14/2023 09:27      Elridge Stemm T. Evertt Chouinard [facility]  If 7PM-7AM, please contact night-coverage [email or web address] 05/14/2023, 12:16 PM

## 2023-05-15 ENCOUNTER — Other Ambulatory Visit (HOSPITAL_COMMUNITY): Payer: Self-pay

## 2023-05-15 ENCOUNTER — Telehealth: Payer: Self-pay | Admitting: *Deleted

## 2023-05-15 DIAGNOSIS — K5981 Ogilvie syndrome: Secondary | ICD-10-CM | POA: Diagnosis not present

## 2023-05-15 DIAGNOSIS — E1122 Type 2 diabetes mellitus with diabetic chronic kidney disease: Secondary | ICD-10-CM | POA: Diagnosis not present

## 2023-05-15 DIAGNOSIS — I10 Essential (primary) hypertension: Secondary | ICD-10-CM | POA: Diagnosis not present

## 2023-05-15 DIAGNOSIS — E039 Hypothyroidism, unspecified: Secondary | ICD-10-CM

## 2023-05-15 DIAGNOSIS — E876 Hypokalemia: Secondary | ICD-10-CM | POA: Diagnosis not present

## 2023-05-15 LAB — RENAL FUNCTION PANEL
Albumin: 2.8 g/dL — ABNORMAL LOW (ref 3.5–5.0)
Anion gap: 9 (ref 5–15)
BUN: 26 mg/dL — ABNORMAL HIGH (ref 8–23)
CO2: 16 mmol/L — ABNORMAL LOW (ref 22–32)
Calcium: 8.9 mg/dL (ref 8.9–10.3)
Chloride: 111 mmol/L (ref 98–111)
Creatinine, Ser: 2.19 mg/dL — ABNORMAL HIGH (ref 0.61–1.24)
GFR, Estimated: 32 mL/min — ABNORMAL LOW (ref >60)
Glucose, Bld: 151 mg/dL — ABNORMAL HIGH (ref 70–99)
Phosphorus: 4.6 mg/dL (ref 2.5–4.6)
Potassium: 3.7 mmol/L (ref 3.5–5.1)
Sodium: 136 mmol/L (ref 135–145)

## 2023-05-15 LAB — GLUCOSE, CAPILLARY
Glucose-Capillary: 148 mg/dL — ABNORMAL HIGH (ref 70–99)
Glucose-Capillary: 126 mg/dL — ABNORMAL HIGH (ref 70–99)

## 2023-05-15 LAB — MAGNESIUM: Magnesium: 1.7 mg/dL (ref 1.7–2.4)

## 2023-05-15 MED ORDER — LINACLOTIDE 290 MCG PO CAPS: 290.0000 ug | ORAL_CAPSULE | Freq: Every day | ORAL | 2 refills | Status: DC

## 2023-05-15 MED ORDER — SIMETHICONE 80 MG PO CHEW: 80.0000 mg | CHEWABLE_TABLET | Freq: Three times a day (TID) | ORAL | 2 refills | Status: AC

## 2023-05-15 MED ORDER — NALOXEGOL OXALATE 12.5 MG PO TABS: 12.5000 mg | ORAL_TABLET | Freq: Every day | ORAL | 11 refills | Status: AC

## 2023-05-15 MED ORDER — CARVEDILOL 12.5 MG PO TABS: 12.5000 mg | ORAL_TABLET | Freq: Two times a day (BID) | ORAL | 3 refills | Status: AC

## 2023-05-15 MED ORDER — SENNA 8.6 MG PO TABS: 2.0000 | ORAL_TABLET | Freq: Every day | ORAL | 2 refills | Status: AC

## 2023-05-15 MED ORDER — METHYLNALTREXONE BROMIDE 12 MG/0.6ML ~~LOC~~ SOLN
6.0000 mg | Freq: Every day | SUBCUTANEOUS | Status: DC
  Filled 2023-05-15: qty 0.6

## 2023-05-15 MED ORDER — ALLOPURINOL 100 MG PO TABS: 100.0000 mg | ORAL_TABLET | Freq: Every day | ORAL | 1 refills | Status: AC

## 2023-05-15 NOTE — Care Management Important Message (Signed)
Important Message  Patient Details  Name: Terry Rise Sr. MRN: [id number] Date of Birth: January 05, 1956   Medicare Important Message Given:  Yes     Sherilyn Banker 05/15/2023, 4:14 PM

## 2023-05-15 NOTE — Telephone Encounter (Signed)
-----   Message from Sammuel Cooper, PA-C sent at 05/15/2023  1:38 PM EDT ----- Regarding: new rx Beth - please send Rx for Movantik 25 mg  - take half tablet every day  for bowels  #30/11.   Pt is hospitalized , apparently insurance will cover  Thanks !

## 2023-05-15 NOTE — Telephone Encounter (Signed)
System would not allow [place] to send Movantik 25 mg 0.5 tablet (tablet unable to be split?). Instead, sent Movantik 12.5 mg tablet, 1 tablet by mouth once daily #30.

## 2023-05-15 NOTE — TOC Benefit Eligibility Note (Signed)
Patient Product/process development scientist completed.    The patient is currently admitted and upon discharge could be taking Relistor 150 mg tablets.  Product not on formulary, Try MOVANTIK,LUBIPROSTONE   The patient is insured through [facility] Part D   This test claim was processed through [facility]- copay amounts may vary at other pharmacies due to pharmacy/plan contracts, or as the patient moves through the different stages of their insurance plan.  Roland Earl, CPHT Pharmacy Patient Advocate Specialist [facility] Patient Advocate Team Direct Number: [phone]  Fax: [phone]

## 2023-05-15 NOTE — TOC Progression Note (Signed)
Transition of Care (TOC) - Progression Note    Patient Details  Name: Terry Bolls Sr. MRN: [id number] Date of Birth: 1956/09/14  Transition of Care [facility]) CM/SW Contact  Nadene Rubins Adria Devon, RN Phone Number: 05/15/2023, 2:01 PM  Clinical Narrative:     Patient being discharged home, requesting PTAR . NCM confirmed address with patient and his son will be at home. PTAR paperwork in chart with PTAR number on it.   Nurse will let NCM know when to call.    Expected Discharge Plan: Home/Self Care Barriers to Discharge: Continued Medical Work up  Expected Discharge Plan and Services   Discharge Planning Services: CM Consult     Expected Discharge Date: 05/15/23               DME Arranged: N/A         HH Arranged: NA           Social Determinants of Health (SDOH) Interventions SDOH Screenings   Tobacco Use: Low Risk  (05/13/2023)    Readmission Risk Interventions    05/08/2023    2:42 PM 04/04/2023    3:30 PM  Readmission Risk Prevention Plan  Transportation Screening Complete Complete  PCP or Specialist Appt within 5-7 Days Complete Complete  Home Care Screening Complete Complete  Medication Review (RN CM) Referral to Pharmacy Referral to Pharmacy

## 2023-05-15 NOTE — Discharge Summary (Signed)
Physician Discharge Summary  Terry Choice Sr. ZOX:[id number] DOB: 07-Aug-1956 DOA: 05/07/2023  PCP: Loura Back, NP  Admit date: 05/07/2023 Discharge date: 05/15/2023 Admitted From: Home Disposition: Home  Recommendations for Outpatient Follow-up:  Follow up with PCP and general surgery as below GI to arrange outpatient follow-up Check CMP, CBC and blood pressure at follow-up Please follow up on the following pending results: None  Home Health: Not indicated.  Patient is at baseline, hoyer lift dependent for transfer Equipment/Devices: Patient has appropriate DME  Discharge Condition: Stable CODE STATUS: Full code  Follow-up Information     Loura Back, NP .   Specialty: Nurse Practitioner Contact information: [address] [place] [address] [phone]         [facility]. Schedule an appointment as soon as possible for a visit in 1 week(s).   Why: Follow-up with colorectal surgery for colon hypomotility Contact information: [address] [place] [address]                Hospital course 67 year old M with PMH of paraplegia with wheelchair dependence, CVA, HTN, HLD, CKD-3B, PVD and obesity presenting with abdominal distention due to recurrent Oglive syndrome.  Flex sigmoidoscopy on 6/5 with poor preparation and dilated sigmoid colon.  GI recommended turning in bed every 2 hours, replating electrolytes daily, rectal tube, bowel regimen and surgery consultation for possible venting colostomy.  Per discussion between GI, Dr. Lavon Paganini, and general surgery Dr. Dossie Der, colostomy won't solve chronic motility issue.  Patient may need total colectomy and end ileostomy but surgery recommended outpatient follow-up with colorectal surgeon before this.   Patient was cleared for discharge by gastroenterology.  GI recommended discontinuing Linzess and Relistor on discharge.  To send prescription for Movantik from  their office.   See individual problem list below for more.   Problems addressed during this hospitalization Principal Problem:   Ogilvie's syndrome Active Problems:   Essential hypertension   Hypokalemia   Diabetes mellitus (HCC)   Hypothyroidism   Stage 3b chronic kidney disease (CKD) (HCC)   Pressure injury of skin   Colon distention   Volvulus of sigmoid colon (HCC)   Recurrent Ogilvie's syndrome: Patient was not taking Relistor on discharge.  Recently hospitalized for the same and underwent decompressive sigmoidoscopy.  Fleck sigmoidoscopy with dilated sigmoid colon.  Had a bowel movements.  Distention improved.   -Appreciate GI recs: Continue MiraLAX.  Discontinue Relistor and Linzess.  GI to send prescription for Movantik -Continue senna -Discontinue amlodipine -Outpatient follow-up with colorectal surgery   Essential hypertension: Normotensive this morning. -Discontinued amlodipine and metoprolol, and started Coreg   CKD-3B/metabolic acidosis:  Baseline Cr 1.7-1.9.  Relatively stable -Continue p.o. sodium bicarbonate -Decrease allopurinol   Uncontrolled NIDDM-2 with CKD-3B and hyperglycemia: A1c 7.1%. -Continue home medication   Normocytic anemia: Stable -Check CBC in 1 week   Debility/ambulatory dysfunction: Uses Hoyer lift for transfer to wheelchair at baseline.   Hyperlipidemia -Continue home meds   Hypothyroidism -Continue Synthroid.   Hypokalemia/hypomagnesemia: Resolved   Rectal polyp -Outpatient follow-up with GI.   Obesity Body mass index is 31.63 kg/m.        Pressure skin injury: POA Pressure Injury 05/08/23 Buttocks Right;Left Stage 2 -  Partial thickness loss of dermis presenting as a shallow open injury with a red, pink wound bed without slough. this is healed pink scar tissue and patchy areas of moisture associated skin damage, N (Active)  05/08/23 0352  Location: Buttocks  Location Orientation: Right;Left  Staging: Stage 2 -  Partial  thickness loss of dermis presenting as a shallow open injury with a red, pink wound bed without slough.  Wound Description (Comments): this is healed pink scar tissue and patchy areas of moisture associated skin damage, NOT a pressure injury  Present on Admission: Yes  Dressing Type Foam - Lift dressing to assess site every shift 05/14/23 2031    Time spent 35 minutes  Vital signs Vitals:   05/14/23 1724 05/14/23 2040 05/15/23 0559 05/15/23 0808  BP: (!) 144/84 (!) 153/70 139/63 (!) 148/63  Pulse: 73 67 67 71  Temp: 98.7 F (37.1 C) 98.2 F (36.8 C) 98.1 F (36.7 C) 98.4 F (36.9 C)  Resp: 17 18 18 16   Height:      Weight:   105.8 kg   SpO2: 100% 100% 100% 97%  TempSrc: Oral Oral Oral Oral  BMI (Calculated):   31.63      Discharge exam  GENERAL: No apparent distress.  Nontoxic. HEENT: MMM.  Vision and hearing grossly intact.  NECK: Supple.  No apparent JVD.  RESP:  No IWOB.  Fair aeration bilaterally. CVS:  RRR. Heart sounds normal.  ABD/GI/GU: BS+. Abd soft, NTND.  MSK/EXT: No apparent deformity. No edema.  BLE weakness.  SKIN: no apparent skin lesion or wound NEURO: Awake and alert. Oriented appropriately.  No apparent focal neuro deficit other than BLE weakness (2/5). PSYCH: Calm. Normal affect.   Discharge Instructions Discharge Instructions     Diet - low sodium heart healthy   Complete by: As directed    Diet Carb Modified   Complete by: As directed    Discharge instructions   Complete by: As directed    It has been a pleasure taking care of you!  You were hospitalized due to body distention.  Your symptoms improved with medication and sigmoidoscopy.  It is very important that you take your medications as prescribed.  Please review your new medication list and the directions on your medications before you take them.  Follow-up with your primary care doctor in 1 to 2 weeks or sooner if needed.  Follow-up with colorectal surgery as soon as possible.   Take  care,   Discharge wound care:   Complete by: As directed    Wound care  Daily      Comments: Leave foam dressing off buttocks, since it is trapping stool underneath the dressing.  Apply barrier cream to bilat buttocks PRN when turning or cleaning.   Increase activity slowly   Complete by: As directed       Allergies as of 05/15/2023   No Known Allergies      Medication List     STOP taking these medications    amLODipine 10 MG tablet Commonly known as: NORVASC   metoprolol tartrate 50 MG tablet Commonly known as: LOPRESSOR   potassium chloride SA 20 MEQ tablet Commonly known as: KLOR-CON M       TAKE these medications    acetaminophen 500 MG tablet Commonly known as: TYLENOL Take 1,000 mg by mouth as needed for moderate pain.   allopurinol 100 MG tablet Commonly known as: ZYLOPRIM Take 1 tablet (100 mg total) by mouth daily. What changed: when to take this   atorvastatin 20 MG tablet Commonly known as: LIPITOR Take 20 mg by mouth at bedtime.   Calcium 250 MG Caps Take 250 mg by mouth daily.   carvedilol 12.5 MG tablet Commonly known as:  Coreg Take 1 tablet (12.5 mg total) by mouth 2 (two) times daily.   docusate sodium 100 MG capsule Commonly known as: COLACE Take 1 capsule (100 mg total) by mouth 2 (two) times daily.   glimepiride 1 MG tablet Commonly known as: AMARYL Take 1 mg by mouth daily.   levothyroxine 50 MCG tablet Commonly known as: SYNTHROID Take 1 tablet (50 mcg total) by mouth daily before breakfast.   linaclotide 290 MCG Caps capsule Commonly known as: LINZESS Take 1 capsule (290 mcg total) by mouth daily before breakfast. Start taking on: May 16, 2023   polyethylene glycol 17 g packet Commonly known as: MIRALAX / GLYCOLAX Take 17 g by mouth 2 (two) times daily. What changed:  when to take this reasons to take this   Relistor 150 MG Tabs Generic drug: Methylnaltrexone Bromide Take 1 tablet (150 mg total) by mouth daily.    senna 8.6 MG Tabs tablet Commonly known as: SENOKOT Take 2 tablets (17.2 mg total) by mouth at bedtime.   simethicone 80 MG chewable tablet Commonly known as: MYLICON Chew 1 tablet (80 mg total) by mouth 4 (four) times daily -  before meals and at bedtime.   sodium bicarbonate 650 MG tablet Take 2 tablets (1,300 mg total) by mouth 3 (three) times daily. What changed: how much to take   traZODone 50 MG tablet Commonly known as: DESYREL Take 1 tablet (50 mg total) by mouth at bedtime as needed for sleep.               Discharge Care Instructions  (From admission, onward)           Start     Ordered   05/15/23 0000  Discharge wound care:       Comments: Wound care  Daily      Comments: Leave foam dressing off buttocks, since it is trapping stool underneath the dressing.  Apply barrier cream to bilat buttocks PRN when turning or cleaning.   05/15/23 1101            Consultations: Gastroenterology General surgery  Procedures/Studies: Flex sigmoidoscopy   DG Abd 2 Views  Result Date: 05/14/2023 CLINICAL DATA:  Abdominal bloating.  Follow-up ileus. EXAM: ABDOMEN - 2 VIEW COMPARISON:  05/12/2023 and older studies. FINDINGS: There is mild overall gaseous distension of the colon and small bowel. Sigmoid colon distension is less than noted on the previous exam, with remaining bowel distension being similar. Soft tissues are poorly defined, grossly unremarkable. IMPRESSION: 1. Sigmoid colon is less distended than on the most recent prior study. Remainder of the bowel shows persistent mild gaseous distension. Electronically Signed   By: Amie Portland M.D.   On: 05/14/2023 09:27   DG Abd 2 Views  Result Date: 05/12/2023 CLINICAL DATA:  Ileus. EXAM: ABDOMEN - 2 VIEW COMPARISON:  Multiple radiographs most recently yesterday. CT 05/07/2023 FINDINGS: Diffuse gaseous distention of large and small bowel. Sigmoid colonic distention is difficult to measure due to diffuse bowel  overlap, however measures at least 17.3 cm. Small bowel distention to a lesser degree. No obvious pneumatosis or free air. No air in the rectum. IMPRESSION: 1. No significant change from prior. Diffuse gaseous distention of large and small bowel, suggesting colonic ileus/Ogilvie syndrome. Sigmoid colonic distention is difficult to measure due to diffuse bowel overlap, however measures at least 17.3 cm. 2. No evidence of free air or pneumatosis. Electronically Signed   By: Narda Rutherford M.D.   On: 05/12/2023 11:33  DG Abd 1 View  Result Date: 05/11/2023 CLINICAL DATA:  Abdominal distension and Ogilvie syndrome. EXAM: ABDOMEN - 1 VIEW COMPARISON:  05/10/2023 FINDINGS: Stable gaseous distension of the colon consistent with chronic Ogilvie syndrome and primarily affecting the transverse, descending and sigmoid colon. Maximum diameter of the colon is difficult to measure by x-ray but appears to be stable since 5/30. No gross evidence of free intraperitoneal air. 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 No gross evidence of free intraperitoneal air. Electronically Signed   By: Irish Lack M.D.   On: 05/11/2023 07:57   ECHOCARDIOGRAM COMPLETE  Result Date: 05/10/2023    ECHOCARDIOGRAM REPORT   Patient Name:   Zong Sorrento Sr. Date of Exam: 05/10/2023 Medical Rec #:  [id number]        Height:       72.0 in Accession #:    [id number]       Weight:       246.9 lb Date of Birth:  1956/01/25        BSA:          2.330 m Patient Age:    67 years         BP:           152/82 mmHg Patient Gender: M                HR:           94 bpm. Exam Location:  Inpatient Procedure: 2D Echo, Cardiac Doppler and Color Doppler Indications:    Sinus tachycardia [[id number]]  History:        Patient has prior history of Echocardiogram examinations, most                 recent 05/12/2020. Stroke, Arrythmias:Tachycardia; Risk                 Factors:Hypertension, Diabetes, Non-Smoker and  Dyslipidemia.  Sonographer:    Aron Baba Referring Phys: [id number] [facility] M PATEL  Sonographer Comments: No subcostal window. IMPRESSIONS  1. Left ventricular ejection fraction, by estimation, is 65 to 70%. The left ventricle has normal function. The left ventricle has no regional wall motion abnormalities. There is mild left ventricular hypertrophy. Left ventricular diastolic parameters are consistent with Grade I diastolic dysfunction (impaired relaxation).  2. Right ventricular systolic function is normal. The right ventricular size is normal.  3. The mitral valve is normal in structure. No evidence of mitral valve regurgitation. No evidence of mitral stenosis.  4. The aortic valve was not well visualized. Aortic valve regurgitation is not visualized. No aortic stenosis is present. FINDINGS  Left Ventricle: Left ventricular ejection fraction, by estimation, is 65 to 70%. The left ventricle has normal function. The left ventricle has no regional wall motion abnormalities. The left ventricular internal cavity size was normal in size. There is  mild left ventricular hypertrophy. Left ventricular diastolic parameters are consistent with Grade I diastolic dysfunction (impaired relaxation). Right Ventricle: The right ventricular size is normal. No increase in right ventricular wall thickness. Right ventricular systolic function is normal. Left Atrium: Left atrial size was normal in size. Right Atrium: Right atrial size was normal in size. Pericardium: There is no evidence of pericardial effusion. Presence of epicardial fat layer. Mitral Valve: The mitral valve is normal in structure. No evidence of mitral valve regurgitation. No evidence of mitral valve stenosis. Tricuspid Valve: The tricuspid valve is normal in structure. Tricuspid valve regurgitation is trivial. Aortic  Valve: The aortic valve was not well visualized. Aortic valve regurgitation is not visualized. No aortic stenosis is present. Pulmonic Valve: The  pulmonic valve was not well visualized. Pulmonic valve regurgitation is trivial. Aorta: The aortic root is normal in size and structure. IAS/Shunts: The interatrial septum was not well visualized.  LEFT VENTRICLE PLAX 2D LVIDd:         4.20 cm   Diastology LVIDs:         2.50 cm   LV e' medial:    4.30 cm/s LV PW:         1.10 cm   LV E/e' medial:  15.1 LV IVS:        0.90 cm   LV e' lateral:   8.43 cm/s LVOT diam:     2.10 cm   LV E/e' lateral: 7.7 LV SV:         67 LV SV Index:   29 LVOT Area:     3.46 cm  RIGHT VENTRICLE RV S prime:     13.90 cm/s TAPSE (M-mode): 2.4 cm LEFT ATRIUM           Index        RIGHT ATRIUM           Index LA diam:      2.90 cm 1.24 cm/m   RA Area:     18.30 cm LA Vol (A2C): 24.1 ml 10.34 ml/m  RA Volume:   51.00 ml  21.89 ml/m LA Vol (A4C): 32.8 ml 14.08 ml/m  AORTIC VALVE LVOT Vmax:   92.80 cm/s LVOT Vmean:  62.400 cm/s LVOT VTI:    0.192 m  AORTA Ao Root diam: 3.50 cm Ao Asc diam:  3.80 cm MITRAL VALVE               TRICUSPID VALVE MV Area (PHT): 2.84 cm    TR Peak grad:   5.3 mmHg MV Decel Time: 267 msec    TR Vmax:        115.00 cm/s MR Peak grad: 4.4 mmHg MR Vmax:      105.35 cm/s  SHUNTS MV E velocity: 65.00 cm/s  Systemic VTI:  0.19 m MV A velocity: 96.90 cm/s  Systemic Diam: 2.10 cm MV E/A ratio:  0.67 Epifanio Lesches MD Electronically signed by Epifanio Lesches MD Signature Date/Time: 05/10/2023/4:24:39 PM    Final    DG Abd 1 View  Result Date: 05/10/2023 CLINICAL DATA:  Abdominal distention EXAM: ABDOMEN - 1 VIEW COMPARISON:  Chest radiograph dated May 09, 2023 FINDINGS: Diffuse gaseous distention of the colon, unchanged when compared with the prior exam. Visualized lung bases are clear. No acute osseous abnormality. IMPRESSION: Diffuse gaseous distention of the colon, unchanged when compared with the prior exam. Electronically Signed   By: Allegra Lai M.D.   On: 05/10/2023 13:11   DG Abd 1 View  Result Date: 05/09/2023 CLINICAL DATA:  Distension  EXAM: ABDOMEN - 1 VIEW COMPARISON:  05/07/2023 FINDINGS: Persistent marked gaseous distension of the:, most pronounced within the sigmoid region. No gross free intraperitoneal air on supine views. No radio-opaque calculi or other significant radiographic abnormality are seen. IMPRESSION: Persistent marked gaseous distension of the colon, most pronounced within the sigmoid region. Electronically Signed   By: Duanne Guess D.O.   On: 05/09/2023 17:27   CT Renal Stone Study  Result Date: 05/07/2023 CLINICAL DATA:  Ogilvie syndrome, abdominal/flank pain EXAM: CT ABDOMEN AND PELVIS WITHOUT CONTRAST TECHNIQUE: Multidetector CT imaging of  the abdomen and pelvis was performed following the standard protocol without IV contrast. RADIATION DOSE REDUCTION: This exam was performed according to the departmental dose-optimization program which includes automated exposure control, adjustment of the mA and/or kV according to patient size and/or use of iterative reconstruction technique. COMPARISON:  04/01/2023 FINDINGS: Lower chest: Moderate right pleural effusion with compressive atelectasis of the right lower lobe again noted. No acute abnormality. Extensive multi-vessel coronary artery calcification. Hepatobiliary: Cholelithiasis without pericholecystic inflammatory change. Liver unremarkable on this noncontrast examination. No intra or extrahepatic biliary ductal dilation. Pancreas: Unremarkable Spleen: Unremarkable Adrenals/Urinary Tract: The adrenal glands are unremarkable. The kidneys are atrophic bilaterally, unchanged from prior examination. Simple cortical cyst arises from the lower pole the right kidney, unchanged, for which no follow-up imaging is recommended. The kidneys are otherwise unremarkable. The bladder is unremarkable. Stomach/Bowel: There is diffuse gaseous distension of the colon in keeping with a colonic ileus. The sigmoid colon is particularly distended measuring up to 13.5 cm in diameter, however,  this appears similar to prior examination and there is no superimposed volvulus identified. The stomach, small bowel, and large bowel are otherwise unremarkable. No focal inflammatory change. Appendix normal. No free intraperitoneal gas or fluid. Vascular/Lymphatic: Aortic atherosclerosis. No enlarged abdominal or pelvic lymph nodes. Reproductive: Prostate is unremarkable. Other: No abdominal wall hernia Musculoskeletal: Degenerative changes are seen within the thoracolumbar spine. No acute bone abnormality. Significant fatty infiltration of the visualized skeletal musculature. IMPRESSION: 1. Diffuse gaseous distension of the colon in keeping with a colonic ileus. The sigmoid colon is particularly distended measuring up to 13.5 cm in diameter, however, this appears similar to prior examination and there is no superimposed volvulus identified. 2. Moderate right pleural effusion with compressive atelectasis of the right lower lobe. 3. Extensive multi-vessel coronary artery calcification. 4. Cholelithiasis. Aortic Atherosclerosis (ICD10-I70.0). Electronically Signed   By: Helyn Numbers M.D.   On: 05/07/2023 23:54   DG Abdomen 1 View  Result Date: 05/07/2023 CLINICAL DATA:  Abdominal distension EXAM: ABDOMEN - 1 VIEW COMPARISON:  04/10/2023 FINDINGS: There is marked gaseous distension of the colon with the distal sigmoid colon measuring up to 18 cm in diameter. While this has a similar appearance to prior CT examination of 04/01/2023, the degree of sigmoid distension appears asymmetric to the remainder of the colon and an underlying sigmoid volvulus is not excluded. No free intraperitoneal gas. Degenerative changes are seen within the lumbar spine. IMPRESSION: 1. Marked gaseous distension of the colon with the distal sigmoid colon measuring up to 18 cm in diameter. While this has a similar appearance to prior CT examination of 04/01/2023, an underlying sigmoid volvulus is not excluded. Repeat CT imaging would be  helpful if there is clinical suspicion of underlying bowel volvulus. Electronically Signed   By: Helyn Numbers M.D.   On: 05/07/2023 22:44       The results of significant diagnostics from this hospitalization (including imaging, microbiology, ancillary and laboratory) are listed below for reference.     Microbiology: No results found for this or any previous visit (from the past 240 hour(s)).   Labs:  CBC: Recent Labs  Lab 05/09/23 0043 05/10/23 0052 05/11/23 1246 05/13/23 0110 05/14/23 0134  WBC 12.0* 10.9* 10.9* 10.1 9.9  NEUTROABS  --   --   --  6.1  --   HGB 11.0* 11.7* 11.0* 10.5* 10.8*  HCT 35.3* 37.0* 34.7* 33.2* 35.2*  MCV 95.1 97.1 94.3 96.2 100.0  PLT 240 258 288 263 263   BMP &  GFR Recent Labs  Lab 05/11/23 1731 05/12/23 0804 05/13/23 0110 05/14/23 0134 05/15/23 0836  NA 136 136 132* 136 136  K 3.1* 3.6 3.8 3.3* 3.7  CL 109 109 107 112* 111  CO2 18* 16* 12* 15* 16*  GLUCOSE 145* 102* 97 115* 151*  BUN 19 21 21 23  26*  CREATININE 1.91* 2.04* 1.98* 2.16* 2.19*  CALCIUM 8.9 9.0 8.7* 8.8* 8.9  MG 2.1 1.9 2.0 1.9 1.7  PHOS  --   --   --  4.7* 4.6   Estimated Creatinine Clearance: 40 mL/min (A) (by C-G formula based on SCr of 2.19 mg/dL (H)). Liver & Pancreas: Recent Labs  Lab 05/10/23 0052 05/11/23 1023 05/13/23 0110 05/14/23 0134 05/15/23 0836  AST 19 26 29   --   --   ALT 39 33 35  --   --   ALKPHOS 155* 149* 152*  --   --   BILITOT 0.6 0.6 0.6  --   --   PROT 7.2 6.7 6.0*  --   --   ALBUMIN 3.0* 2.9* 2.8* 2.9* 2.8*   No results for input(s): "LIPASE", "AMYLASE" in the last 168 hours. No results for input(s): "AMMONIA" in the last 168 hours. Diabetic: No results for input(s): "HGBA1C" in the last 72 hours. Recent Labs  Lab 05/14/23 0901 05/14/23 1216 05/14/23 1722 05/14/23 2113 05/15/23 0715  GLUCAP 114* 89 134* 112* 148*   Cardiac Enzymes: No results for input(s): "CKTOTAL", "CKMB", "CKMBINDEX", "TROPONINI" in the last 168  hours. No results for input(s): "PROBNP" in the last 8760 hours. Coagulation Profile: No results for input(s): "INR", "PROTIME" in the last 168 hours. Thyroid Function Tests: No results for input(s): "TSH", "T4TOTAL", "FREET4", "T3FREE", "THYROIDAB" in the last 72 hours. Lipid Profile: No results for input(s): "CHOL", "HDL", "LDLCALC", "TRIG", "CHOLHDL", "LDLDIRECT" in the last 72 hours. Anemia Panel: No results for input(s): "VITAMINB12", "FOLATE", "FERRITIN", "TIBC", "IRON", "RETICCTPCT" in the last 72 hours. Urine analysis:    Component Value Date/Time   COLORURINE YELLOW 04/01/2023 0527   APPEARANCEUR HAZY (A) 04/01/2023 0527   LABSPEC 1.012 04/01/2023 0527   PHURINE 5.0 04/01/2023 0527   GLUCOSEU NEGATIVE 04/01/2023 0527   HGBUR NEGATIVE 04/01/2023 0527   BILIRUBINUR NEGATIVE 04/01/2023 0527   KETONESUR NEGATIVE 04/01/2023 0527   PROTEINUR 100 (A) 04/01/2023 0527   NITRITE POSITIVE (A) 04/01/2023 0527   LEUKOCYTESUR MODERATE (A) 04/01/2023 0527   Sepsis Labs: Invalid input(s): "PROCALCITONIN", "LACTICIDVEN"   SIGNED:  Almon Hercules, MD  [facility] 05/15/2023, 11:02 AM

## 2023-05-17 ENCOUNTER — Encounter (HOSPITAL_COMMUNITY): Payer: Self-pay | Admitting: Gastroenterology

## 2023-05-18 NOTE — Anesthesia Postprocedure Evaluation (Signed)
Anesthesia Post Note  Patient: [name] Sr.  Procedure(s) Performed: FLEXIBLE SIGMOIDOSCOPY BOWEL DECOMPRESSION     Patient location during evaluation: PACU Anesthesia Type: General Level of consciousness: awake and alert Pain management: pain level controlled Vital Signs Assessment: post-procedure vital signs reviewed and stable Respiratory status: spontaneous breathing, nonlabored ventilation, respiratory function stable and patient connected to nasal cannula oxygen Cardiovascular status: blood pressure returned to baseline and stable Postop Assessment: no apparent nausea or vomiting Anesthetic complications: no   No notable events documented.  Last Vitals:  Vitals:   05/15/23 0559 05/15/23 0808  BP: 139/63 (!) 148/63  Pulse: 67 71  Resp: 18 16  Temp: 36.7 C 36.9 C  SpO2: 100% 97%    Last Pain:  Vitals:   05/15/23 0808  TempSrc: Oral  PainSc: 3                  Tiyana Galla S

## 2023-06-05 ENCOUNTER — Ambulatory Visit: Payer: Medicare HMO | Admitting: Nurse Practitioner

## 2023-08-18 ENCOUNTER — Telehealth: Payer: Self-pay

## 2023-08-18 ENCOUNTER — Ambulatory Visit (INDEPENDENT_AMBULATORY_CARE_PROVIDER_SITE_OTHER): Payer: Medicare HMO | Admitting: Physician Assistant

## 2023-08-18 ENCOUNTER — Other Ambulatory Visit (INDEPENDENT_AMBULATORY_CARE_PROVIDER_SITE_OTHER): Payer: Medicare HMO

## 2023-08-18 ENCOUNTER — Encounter: Payer: Self-pay | Admitting: Physician Assistant

## 2023-08-18 VITALS — BP 126/70 | HR 65 | Ht 72.0 in

## 2023-08-18 DIAGNOSIS — K5981 Ogilvie syndrome: Secondary | ICD-10-CM | POA: Diagnosis not present

## 2023-08-18 LAB — BASIC METABOLIC PANEL
BUN: 33 mg/dL — ABNORMAL HIGH (ref 6–23)
CO2: 16 meq/L — ABNORMAL LOW (ref 19–32)
Calcium: 7.9 mg/dL — ABNORMAL LOW (ref 8.4–10.5)
Chloride: 111 meq/L (ref 96–112)
Creatinine, Ser: 2.3 mg/dL — ABNORMAL HIGH (ref 0.40–1.50)
GFR: 28.27 mL/min — ABNORMAL LOW (ref >60.00)
Glucose, Bld: 273 mg/dL — ABNORMAL HIGH (ref 70–99)
Potassium: 2.8 meq/L — CL (ref 3.5–5.1)
Sodium: 138 meq/L (ref 135–145)

## 2023-08-18 LAB — CBC WITH DIFFERENTIAL/PLATELET
Basophils Absolute: 0.1 10*3/uL (ref 0.0–0.1)
Basophils Relative: 0.4 % (ref 0.0–3.0)
Eosinophils Absolute: 0.2 10*3/uL (ref 0.0–0.7)
Eosinophils Relative: 1.5 % (ref 0.0–5.0)
HCT: 35.2 % — ABNORMAL LOW (ref 39.0–52.0)
Hemoglobin: 10.8 g/dL — ABNORMAL LOW (ref 13.0–17.0)
Lymphocytes Relative: 15.4 % (ref 12.0–46.0)
Lymphs Abs: 1.9 10*3/uL (ref 0.7–4.0)
MCHC: 30.8 g/dL (ref 30.0–36.0)
MCV: 98.6 fl (ref 78.0–100.0)
Monocytes Absolute: 0.6 10*3/uL (ref 0.1–1.0)
Monocytes Relative: 5.1 % (ref 3.0–12.0)
Neutro Abs: 9.4 10*3/uL — ABNORMAL HIGH (ref 1.4–7.7)
Neutrophils Relative %: 77.6 % — ABNORMAL HIGH (ref 43.0–77.0)
Platelets: 290 10*3/uL (ref 150.0–400.0)
RBC: 3.57 Mil/uL — ABNORMAL LOW (ref 4.22–5.81)
RDW: 18 % — ABNORMAL HIGH (ref 11.5–15.5)
WBC: 12.1 10*3/uL — ABNORMAL HIGH (ref 4.0–10.5)

## 2023-08-18 LAB — MAGNESIUM: Magnesium: 1.1 mg/dL — ABNORMAL LOW (ref 1.5–2.5)

## 2023-08-18 NOTE — Patient Instructions (Addendum)
_______________________________________________________  If your blood pressure at your visit was 140/90 or greater, please contact your primary care physician to follow up on this. _______________________________________________________  If you are age 69 or older, your body mass index should be between 23-30. Your Body mass index is 31.63 kg/m. If this is out of the aforementioned range listed, please consider follow up with your Primary Care Provider. _______________________________________________________  The [facility] providers would like to encourage you to use [facility] to communicate with providers for non-urgent requests or questions.  Due to long hold times on the telephone, sending your provider a message by [facility] may be a faster and more efficient way to get a response.  Please allow 48 business hours for a response.  Please remember that this is for non-urgent requests.  _______________________________________________________  Your provider has requested that you go to the basement level for lab work before leaving today. Press "B" on the elevator. The lab is located at the first door on the left as you exit the elevator.  CONTINUE: current bowel regimen  CONTINUE: Movantik 12.5mg  daily  INCREASE Calorie intake, drink 2 to 3 Ensure or Boost (high protein) daily in addition to diet.   Due to recent changes in healthcare laws, you may see the results of your imaging and laboratory studies on MyChart before your provider has had a chance to review them.  We understand that in some cases there may be results that are confusing or concerning to you. Not all laboratory results come back in the same time frame and the provider may be waiting for multiple results in order to interpret others.  Please give [place] 48 hours in order for your provider to thoroughly review all the results before contacting the office for clarification of your results.   Thank you for entrusting me with your care  and choosing [facility].  Amy Esterwood, PA-C

## 2023-08-18 NOTE — Progress Notes (Signed)
Subjective:    Patient ID: Terry Revere Sr., male    DOB: Jan 29, 1956, 67 y.o.   MRN: [id number]  HPI  Terry Barton is a 67 year old African-American male, now established with Dr. Barron Alvine who has thus far only been seen during hospitalizations and comes in today for post hospital follow-up. 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  Patient has history of previous intracranial hemorrhage secondary to hypertension, and remote spinal cord injury with paraplegia 2016, adult onset diabetes mellitus, chronic kidney disease stage IIIb, peripheral vascular disease, and hypothyroidism. He is bed and wheelchair bound.  When he was admitted in March she had gradual development of significant abdominal distention at home, precipitating hospitalization.  CT showed multiple distended loops of transverse descending and sigmoid colon with no evidence of obstruction, there was some mild rectal wall thickening and perirectal fat stranding suggestive of proctitis.  Ogilvie's was suggested at that time.  This was associated with hypokalemia and hypomagnesemia.  He underwent sigmoidoscopy which showed a dilated colon above the anus which was decompressed and had a polyp noted in the distal rectum which was not resected. He required a second sigmoidoscopy during that admission with decompression and placement of a rectal tube, his abdominal distention gradually improved but this required multiple doses of MiraLAX, and 2 does of Relistor. Unfortunately his insurance would not cover [employer] so he could not continue this at home.  There was to be consideration of complete colonoscopy later.  He required readmission 5/31 through 05/15/2023 with the same symptoms.  Initially he was passing loose stools using Colace at home and MiraLAX as needed but then developed again recurrent progressive abdominal distention without nausea or vomiting.  Interestingly he  definitely does not have any abdominal pain with this.  Repeat CT imaging again showed diffuse gaseous distention of the colon consistent with colonic ileus, distended sigmoid colon up to 13.5 cm similar to previous and no evidence of volvulus.  Stomach and small bowel and remainder of the large bowel appear grossly unremarkable. He underwent another flexible sigmoidoscopy per Dr. Nathaniel Man with finding of a grossly dilated sigmoid colon decompression of most of the sigmoid was achieved and rectal tube was placed temporarily. He was restarted on Relistor-then Movantik was to be started on discharge at 12.5 mg daily.  He did have surgical consultation during that admission per Dr. Danise Edge who at that point were recommending possible total colectomy.  Patient declined to have surgery.  His son says that his abdomen has been distended over the past couple of months but they have been managing at home and the patient did not want to be readmitted to the hospital.  Patient says he does not have any abdominal pain, he has been getting Movantik 12.5 mg daily, in addition to MiraLAX twice a day and Dulcolax as needed.  His son says he usually has at least 2-3 very loose bowel movements each day.  He does not have any nausea or vomiting, he has not had any fever or chills.  He says his dad has not had a good appetite for a long time and he really is not eating much.  He usually eats a small amount of breakfast and then is drinking Ensure later in the day.  He usually does not want to eat any further food.  They are not aware of any weight loss.  He had multiple questions about management and options today.  Review of Systems Pertinent positive and negative review of systems were noted in the above HPI section.  All other review of systems was otherwise negative.   Outpatient Encounter Medications as of 08/18/2023  Medication Sig   acetaminophen (TYLENOL) 500 MG tablet Take 1,000 mg by mouth as needed  for moderate pain.   allopurinol (ZYLOPRIM) 100 MG tablet Take 1 tablet (100 mg total) by mouth daily.   atorvastatin (LIPITOR) 20 MG tablet Take 20 mg by mouth at bedtime.   carvedilol (COREG) 12.5 MG tablet Take 1 tablet (12.5 mg total) by mouth 2 (two) times daily.   docusate sodium (COLACE) 100 MG capsule Take 1 capsule (100 mg total) by mouth 2 (two) times daily.   glimepiride (AMARYL) 1 MG tablet Take 1 mg by mouth daily. (Patient not taking: Reported on 08/18/2023)   levothyroxine (SYNTHROID) 50 MCG tablet Take 1 tablet (50 mcg total) by mouth daily before breakfast.   naloxegol oxalate (MOVANTIK) 12.5 MG TABS tablet Take 1 tablet (12.5 mg total) by mouth daily.   polyethylene glycol (MIRALAX / GLYCOLAX) 17 g packet Take 17 g by mouth 2 (two) times daily. (Patient taking differently: Take 17 g by mouth as needed for moderate constipation.)   senna (SENOKOT) 8.6 MG TABS tablet Take 2 tablets (17.2 mg total) by mouth at bedtime.   simethicone (MYLICON) 80 MG chewable tablet Chew 1 tablet (80 mg total) by mouth 4 (four) times daily -  before meals and at bedtime.   sodium bicarbonate 650 MG tablet Take 2 tablets (1,300 mg total) by mouth 3 (three) times daily. (Patient taking differently: Take 650 mg by mouth 3 (three) times daily.)   traZODone (DESYREL) 50 MG tablet Take 1 tablet (50 mg total) by mouth at bedtime as needed for sleep. (Patient not taking: Reported on 08/18/2023)   No facility-administered encounter medications on file as of 08/18/2023.   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   Abnormality of rectum 04/01/2023   Abnormal CT of the abdomen 04/01/2023   Abdominal distension 04/01/2023   Osteomyelitis (HCC) 06/01/2022   Hypothyroidism 06/01/2022   Stage 3b chronic kidney disease (CKD) (HCC) 06/01/2022    Macrocytic anemia 03/30/2022   Moderate protein-calorie malnutrition (HCC) 03/30/2022   Metabolic acidosis, normal anion gap (NAG) 03/30/2022   Biliary sludge 03/30/2022   Gastroenteritis 03/30/2022   Essential hypertension 05/17/2020   Hyperlipidemia 05/17/2020   Acute renal failure superimposed on stage 3a chronic kidney disease (HCC) 05/17/2020   Hypokalemia 05/17/2020   ICH (intracerebral hemorrhage) (HCC) d/t HTN 05/12/2020   Diabetes mellitus (HCC) 10/18/2019   Social History   Socioeconomic History   Marital status: Single    Spouse name: Not on file   Number of children: Not on file   Years of education: Not on file   Highest education level: Not on file  Occupational History   Occupation: disabled  Tobacco Use   Smoking status: Never   Smokeless tobacco: Never  Vaping Use   Vaping status: Never Used  Substance and Sexual Activity   Alcohol use: Not Currently   Drug use: Not Currently   Sexual activity: Not Currently  Other Topics Concern   Not on file  Social History Narrative   Not on file   Social Determinants of Health   Financial Resource  Strain: Not on file  Food Insecurity: Not on file  Transportation Needs: Not on file  Physical Activity: Not on file  Stress: Not on file  Social Connections: Not on file  Intimate Partner Violence: Not on file    Terry Barton Family history is unknown by patient.      Objective:    Vitals:   08/18/23 1113  BP: 126/70  Pulse: 65    Physical Exam Well-developed well-nourished older AA male  in no acute distress.  , [id number]  BMI 126/70 patient is in a motorized wheelchair, accompanied by son  HEENT; nontraumatic normocephalic, EOMI, PE R LA, sclera anicteric. Oropharynx; not examined today Neck; supple, no JVD Cardiovascular; regular rate and rhythm with S1-S2, no murmur rub or gallop Pulmonary; Clear bilaterally Abdomen; , protuberant, distended, tympanitic with high-pitched tinkling bowel sounds,  nontender ,no palpable mass or hepatosplenomegaly,  Rectal; not done today Skin; benign exam, no jaundice rash or appreciable lesions Extremities; no clubbing cyanosis or edema skin warm and dry Neuro/Psych; alert and oriented x4, grossly nonfocal mood and affect appropriate, wheelchair-bound, paraplegia       Assessment & Plan:   #6 67 year old African-American male with Ogilvie's syndrome, and a persistent dilation primarily of the sigmoid and descending colon.  He has had 2 admissions over the past 4 to 5 months, and has undergone decompressions with these admissions but quickly has recurrence of a grossly distended sigmoid.  He does not have a volvulus.  He does have paraplegia secondary to prior spinal cord injury, and Ogilvie's may be associated. He has also had hypokalemia and hypomagnesemia with each admission complicating his bowel function.  He been quite distended over the past couple of months per his son today but does not complain of pain, he is having 2-3 loose bowel movements per day on his current bowel regimen and no nausea or vomiting.  Oral intake is poor she is chronic for this patient.  He was seen by surgery during his last admission but the patient did not want to pursue surgical management with a colostomy, and at that time surgery was suggesting perhaps total colectomy which may not be necessary .  2 adult onset diabetes mellitus 3.  Paraplegia secondary to prior spinal cord injury, wheelchair-bound 4.  Previous CVA/intracranial hemorrhage 2021 5.  Chronic kidney disease stage III #6 hypertension  Plan; CBC with differential, c-Met today, magnesium level  Plan to continue Movantik 12.5 mg p.o. daily, refill sent Continue MiraLAX 17 g in 8 ounces of water twice daily Senokot 2 p.o. nightly Off narcotics Long discussion with patient and his son today regarding management moving forward.  Unfortunately we do not have any cure for this problem and he is likely to  have ongoing issues and may require repeat hospitalization. Think he would benefit from another discussion with surgery regarding partial colectomy with colostomy would serve the purpose to vent his colon, I do not think he would need a total colectomy.  He is still reluctant to consider surgery but they will discuss and call back if they would like an outpatient referral to surgery.  Son is advised that should he develop nausea or vomiting, inability to have a bowel movement, or increase in his baseline distention that they should take him to the emergency room. Son was asking whether he could be set up for serial decompressions, he was advised that we just do not have a mechanism to make that possible, and that does not fix his problem  just temporize his for a few days.  Rateel Beldin Oswald Hillock PA-C 08/18/2023   Cc: Loura Back, NP

## 2023-08-18 NOTE — Telephone Encounter (Signed)
Lab called with critical potassium of 2.8

## 2023-08-19 ENCOUNTER — Other Ambulatory Visit: Payer: Self-pay

## 2023-08-19 DIAGNOSIS — K5981 Ogilvie syndrome: Secondary | ICD-10-CM

## 2023-08-19 MED ORDER — POTASSIUM CHLORIDE CRYS ER 20 MEQ PO TBCR: EXTENDED_RELEASE_TABLET | ORAL | 0 refills | Status: AC

## 2023-08-26 NOTE — Progress Notes (Signed)
Agree with the assessment and plan as outlined by Amy Esterwood, PA-C.  Jeremy Mclamb, DO, FACG  

## 2024-07-08 DEATH — deceased
